# Patient Record
Sex: Male | Born: 1950 | Race: White | Hispanic: No | Marital: Single | Smoking: Never smoker
Health system: Southern US, Community
[De-identification: ages and names within clinical notes are randomized; demographics above are authoritative.]

## PROBLEM LIST (undated history)

## (undated) DIAGNOSIS — F419 Anxiety disorder, unspecified: Secondary | ICD-10-CM

## (undated) DIAGNOSIS — R51 Headache: Secondary | ICD-10-CM

## (undated) DIAGNOSIS — G4701 Insomnia due to medical condition: Secondary | ICD-10-CM

## (undated) DIAGNOSIS — I10 Essential (primary) hypertension: Secondary | ICD-10-CM

## (undated) DIAGNOSIS — G8929 Other chronic pain: Secondary | ICD-10-CM

## (undated) DIAGNOSIS — R519 Headache, unspecified: Secondary | ICD-10-CM

## (undated) HISTORY — DX: Insomnia due to medical condition: G47.01

## (undated) HISTORY — DX: Other chronic pain: G89.29

## (undated) HISTORY — DX: Headache: R51

## (undated) HISTORY — PX: OTHER SURGICAL HISTORY: SHX169

## (undated) HISTORY — DX: Headache, unspecified: R51.9

---

## 1998-05-01 ENCOUNTER — Emergency Department (HOSPITAL_COMMUNITY): Admission: EM | Admit: 1998-05-01 | Discharge: 1998-05-02 | Payer: Self-pay | Admitting: Emergency Medicine

## 1998-05-01 ENCOUNTER — Encounter: Payer: Self-pay | Admitting: Emergency Medicine

## 2002-09-24 ENCOUNTER — Encounter: Payer: Self-pay | Admitting: Emergency Medicine

## 2002-09-24 ENCOUNTER — Emergency Department (HOSPITAL_COMMUNITY): Admission: EM | Admit: 2002-09-24 | Discharge: 2002-09-24 | Payer: Self-pay | Admitting: Emergency Medicine

## 2003-05-07 ENCOUNTER — Emergency Department (HOSPITAL_COMMUNITY): Admission: EM | Admit: 2003-05-07 | Discharge: 2003-05-07 | Payer: Self-pay | Admitting: Emergency Medicine

## 2004-03-19 ENCOUNTER — Ambulatory Visit: Payer: Self-pay | Admitting: Family Medicine

## 2004-04-12 ENCOUNTER — Ambulatory Visit: Payer: Self-pay | Admitting: Family Medicine

## 2004-04-16 ENCOUNTER — Ambulatory Visit: Payer: Self-pay | Admitting: Family Medicine

## 2004-05-21 ENCOUNTER — Ambulatory Visit: Payer: Self-pay | Admitting: Family Medicine

## 2004-06-18 ENCOUNTER — Ambulatory Visit: Payer: Self-pay | Admitting: Family Medicine

## 2004-07-16 ENCOUNTER — Ambulatory Visit: Payer: Self-pay | Admitting: Family Medicine

## 2004-07-18 ENCOUNTER — Ambulatory Visit: Payer: Self-pay | Admitting: Family Medicine

## 2004-07-28 ENCOUNTER — Emergency Department (HOSPITAL_COMMUNITY): Admission: EM | Admit: 2004-07-28 | Discharge: 2004-07-28 | Payer: Self-pay | Admitting: Emergency Medicine

## 2004-07-31 ENCOUNTER — Ambulatory Visit: Payer: Self-pay | Admitting: Family Medicine

## 2004-08-29 ENCOUNTER — Ambulatory Visit: Payer: Self-pay | Admitting: Family Medicine

## 2004-09-12 ENCOUNTER — Ambulatory Visit: Payer: Self-pay | Admitting: Family Medicine

## 2004-10-01 ENCOUNTER — Ambulatory Visit: Payer: Self-pay | Admitting: Family Medicine

## 2004-10-16 ENCOUNTER — Ambulatory Visit: Payer: Self-pay | Admitting: Family Medicine

## 2004-11-20 ENCOUNTER — Ambulatory Visit: Payer: Self-pay | Admitting: Family Medicine

## 2004-12-25 ENCOUNTER — Ambulatory Visit: Payer: Self-pay | Admitting: Family Medicine

## 2005-01-22 ENCOUNTER — Ambulatory Visit: Payer: Self-pay | Admitting: Family Medicine

## 2005-01-30 ENCOUNTER — Ambulatory Visit: Payer: Self-pay | Admitting: Family Medicine

## 2005-02-13 ENCOUNTER — Ambulatory Visit: Payer: Self-pay | Admitting: Family Medicine

## 2005-02-26 ENCOUNTER — Ambulatory Visit: Payer: Self-pay | Admitting: Family Medicine

## 2005-03-04 ENCOUNTER — Emergency Department (HOSPITAL_COMMUNITY): Admission: EM | Admit: 2005-03-04 | Discharge: 2005-03-04 | Payer: Self-pay | Admitting: Emergency Medicine

## 2005-04-03 ENCOUNTER — Ambulatory Visit: Payer: Self-pay | Admitting: Family Medicine

## 2005-04-16 ENCOUNTER — Ambulatory Visit: Payer: Self-pay | Admitting: Family Medicine

## 2005-05-01 ENCOUNTER — Ambulatory Visit: Payer: Self-pay | Admitting: Family Medicine

## 2005-05-15 ENCOUNTER — Ambulatory Visit: Payer: Self-pay | Admitting: Family Medicine

## 2005-06-12 ENCOUNTER — Ambulatory Visit: Payer: Self-pay | Admitting: Family Medicine

## 2005-07-03 ENCOUNTER — Ambulatory Visit: Payer: Self-pay | Admitting: Family Medicine

## 2005-09-02 ENCOUNTER — Ambulatory Visit: Payer: Self-pay | Admitting: Family Medicine

## 2005-09-17 ENCOUNTER — Ambulatory Visit: Payer: Self-pay | Admitting: Family Medicine

## 2005-09-30 ENCOUNTER — Ambulatory Visit: Payer: Self-pay | Admitting: Family Medicine

## 2005-10-08 ENCOUNTER — Ambulatory Visit: Payer: Self-pay | Admitting: Family Medicine

## 2005-10-23 ENCOUNTER — Ambulatory Visit: Payer: Self-pay | Admitting: Family Medicine

## 2005-11-25 ENCOUNTER — Ambulatory Visit: Payer: Self-pay | Admitting: Family Medicine

## 2005-12-10 ENCOUNTER — Ambulatory Visit: Payer: Self-pay | Admitting: Family Medicine

## 2005-12-23 ENCOUNTER — Ambulatory Visit: Payer: Self-pay | Admitting: Family Medicine

## 2006-01-14 ENCOUNTER — Ambulatory Visit: Payer: Self-pay | Admitting: Family Medicine

## 2006-01-27 ENCOUNTER — Ambulatory Visit: Payer: Self-pay | Admitting: Family Medicine

## 2006-02-24 ENCOUNTER — Ambulatory Visit: Payer: Self-pay | Admitting: Family Medicine

## 2006-02-25 ENCOUNTER — Ambulatory Visit: Payer: Self-pay | Admitting: Family Medicine

## 2006-03-31 ENCOUNTER — Ambulatory Visit: Payer: Self-pay | Admitting: Family Medicine

## 2006-04-28 ENCOUNTER — Ambulatory Visit: Payer: Self-pay | Admitting: Family Medicine

## 2006-05-21 ENCOUNTER — Ambulatory Visit: Payer: Self-pay | Admitting: Family Medicine

## 2006-05-28 ENCOUNTER — Ambulatory Visit: Payer: Self-pay | Admitting: Family Medicine

## 2006-06-09 ENCOUNTER — Ambulatory Visit: Payer: Self-pay | Admitting: Family Medicine

## 2006-07-14 ENCOUNTER — Ambulatory Visit: Payer: Self-pay | Admitting: Family Medicine

## 2006-07-15 ENCOUNTER — Encounter: Payer: Self-pay | Admitting: Family Medicine

## 2006-10-14 ENCOUNTER — Encounter: Payer: Self-pay | Admitting: Family Medicine

## 2006-10-14 DIAGNOSIS — I10 Essential (primary) hypertension: Secondary | ICD-10-CM | POA: Insufficient documentation

## 2006-10-14 DIAGNOSIS — R519 Headache, unspecified: Secondary | ICD-10-CM | POA: Insufficient documentation

## 2006-10-14 DIAGNOSIS — R51 Headache: Secondary | ICD-10-CM | POA: Insufficient documentation

## 2007-04-30 ENCOUNTER — Telehealth: Payer: Self-pay | Admitting: Family Medicine

## 2007-05-07 ENCOUNTER — Telehealth (INDEPENDENT_AMBULATORY_CARE_PROVIDER_SITE_OTHER): Payer: Self-pay | Admitting: *Deleted

## 2007-12-22 ENCOUNTER — Telehealth: Payer: Self-pay | Admitting: Family Medicine

## 2007-12-31 ENCOUNTER — Emergency Department (HOSPITAL_COMMUNITY): Admission: EM | Admit: 2007-12-31 | Discharge: 2007-12-31 | Payer: Self-pay | Admitting: *Deleted

## 2008-01-03 ENCOUNTER — Ambulatory Visit: Payer: Self-pay | Admitting: *Deleted

## 2008-04-27 ENCOUNTER — Emergency Department (HOSPITAL_COMMUNITY): Admission: EM | Admit: 2008-04-27 | Discharge: 2008-04-27 | Payer: Self-pay | Admitting: Emergency Medicine

## 2008-07-06 ENCOUNTER — Emergency Department (HOSPITAL_COMMUNITY): Admission: EM | Admit: 2008-07-06 | Discharge: 2008-07-06 | Payer: Self-pay | Admitting: Emergency Medicine

## 2008-07-19 ENCOUNTER — Emergency Department (HOSPITAL_COMMUNITY): Admission: EM | Admit: 2008-07-19 | Discharge: 2008-07-19 | Payer: Self-pay | Admitting: Emergency Medicine

## 2008-08-24 ENCOUNTER — Emergency Department (HOSPITAL_COMMUNITY): Admission: EM | Admit: 2008-08-24 | Discharge: 2008-08-24 | Payer: Self-pay | Admitting: Emergency Medicine

## 2008-09-10 ENCOUNTER — Emergency Department (HOSPITAL_COMMUNITY): Admission: EM | Admit: 2008-09-10 | Discharge: 2008-09-10 | Payer: Self-pay | Admitting: Emergency Medicine

## 2008-10-08 ENCOUNTER — Emergency Department (HOSPITAL_COMMUNITY): Admission: EM | Admit: 2008-10-08 | Discharge: 2008-10-08 | Payer: Self-pay | Admitting: Emergency Medicine

## 2008-12-02 ENCOUNTER — Emergency Department (HOSPITAL_COMMUNITY): Admission: EM | Admit: 2008-12-02 | Discharge: 2008-12-02 | Payer: Self-pay | Admitting: Emergency Medicine

## 2009-05-02 ENCOUNTER — Encounter (INDEPENDENT_AMBULATORY_CARE_PROVIDER_SITE_OTHER): Payer: Self-pay | Admitting: *Deleted

## 2010-01-02 ENCOUNTER — Telehealth: Payer: Self-pay | Admitting: Gastroenterology

## 2010-02-07 ENCOUNTER — Emergency Department (HOSPITAL_COMMUNITY): Admission: EM | Admit: 2010-02-07 | Discharge: 2010-02-07 | Payer: Self-pay | Admitting: Emergency Medicine

## 2010-06-20 NOTE — Progress Notes (Signed)
Summary: Schedule Colonoscopy  Phone Note Outgoing Call Call back at Home Phone [phone]   Call placed by: Harlow Mares CMA Duncan Dull),  January 02, 2010 11:18 AM Call placed to: Patient Summary of Call: Left message on patients machine to call back, patient is due for a colonoscopy Initial call taken by: Harlow Mares CMA Duncan Dull),  January 02, 2010 11:18 AM  Follow-up for Phone Call        Left a message on the patient machine to call back and schedule a previsit and procedure with our office. A letter will be mailed to the patient.  Follow-up by: Harlow Mares CMA Duncan Dull),  January 07, 2010 10:19 AM

## 2010-10-04 NOTE — Assessment & Plan Note (Signed)
[facility]                                 ON-CALL NOTE   NAME:Lezama, XUE LOW                      MRN:          [id number]  DATE:05/09/2006                            DOB:          1950/06/08    At 4:20 p.m. on May 09, 2006.   Mr. Trupiano calls and says he had tried on Friday to transfer  prescription for Viagra from [facility] to [facility], and that they did  not know who he was, and they could not do it.  This was an unusual  phone call.  I told him that usuallyyou would want your doctor to change  that, and he said pharmacies do it, but they did not know who he was, so  he was asking me for help.  I then told him that we really do not do  things on the weekends.  I do not know his medical history; Viagra can  have complications or side effects, and he hung up on me.  I am not sure  if he was on the level, but it was not a standard phone call, and I had  a sense that it was not on the level.  It might have been an established  patient with a reasonable request, in which case Dr. Scotty Court may want  to talk to the patient and get his Viagra refilled.     Karie Schwalbe, MD  Electronically Signed    RIL/MedQ  DD: 05/09/2006  DT: 05/10/2006  Job #: [id number]   cc:   Ellin Saba., MD

## 2011-02-19 ENCOUNTER — Telehealth: Payer: Self-pay | Admitting: Family Medicine

## 2011-02-19 NOTE — Telephone Encounter (Signed)
Pt is req to re-est with Dr Scotty Court. Pt said he goes by the name of "Charles Ball". Pt says that he does have an outstanding balance with [employer], but pt is planning on making pymts. Pt would be self pay in cash. Pls advise.

## 2011-08-11 ENCOUNTER — Encounter: Payer: Self-pay | Admitting: Gastroenterology

## 2011-10-18 ENCOUNTER — Emergency Department (HOSPITAL_COMMUNITY): Payer: Medicaid Other

## 2011-10-18 ENCOUNTER — Encounter (HOSPITAL_COMMUNITY): Payer: Self-pay | Admitting: *Deleted

## 2011-10-18 ENCOUNTER — Inpatient Hospital Stay (HOSPITAL_COMMUNITY)
Admission: EM | Admit: 2011-10-18 | Discharge: 2011-10-31 | DRG: 853 | Disposition: A | Discharge status: Skilled Nursing Facility | Payer: Medicaid Other | Source: Ambulatory Visit | Attending: Internal Medicine | Admitting: Internal Medicine

## 2011-10-18 DIAGNOSIS — M6282 Rhabdomyolysis: Secondary | ICD-10-CM | POA: Diagnosis present

## 2011-10-18 DIAGNOSIS — A419 Sepsis, unspecified organism: Secondary | ICD-10-CM

## 2011-10-18 DIAGNOSIS — D6959 Other secondary thrombocytopenia: Secondary | ICD-10-CM | POA: Diagnosis present

## 2011-10-18 DIAGNOSIS — R51 Headache: Secondary | ICD-10-CM

## 2011-10-18 DIAGNOSIS — G629 Polyneuropathy, unspecified: Secondary | ICD-10-CM

## 2011-10-18 DIAGNOSIS — G8190 Hemiplegia, unspecified affecting unspecified side: Secondary | ICD-10-CM | POA: Diagnosis present

## 2011-10-18 DIAGNOSIS — G062 Extradural and subdural abscess, unspecified: Secondary | ICD-10-CM

## 2011-10-18 DIAGNOSIS — I4949 Other premature depolarization: Secondary | ICD-10-CM | POA: Diagnosis not present

## 2011-10-18 DIAGNOSIS — M9901 Segmental and somatic dysfunction of cervical region: Secondary | ICD-10-CM

## 2011-10-18 DIAGNOSIS — N179 Acute kidney failure, unspecified: Secondary | ICD-10-CM | POA: Diagnosis not present

## 2011-10-18 DIAGNOSIS — E876 Hypokalemia: Secondary | ICD-10-CM | POA: Diagnosis present

## 2011-10-18 DIAGNOSIS — G039 Meningitis, unspecified: Secondary | ICD-10-CM

## 2011-10-18 DIAGNOSIS — J0100 Acute maxillary sinusitis, unspecified: Secondary | ICD-10-CM | POA: Diagnosis present

## 2011-10-18 DIAGNOSIS — R6520 Severe sepsis without septic shock: Secondary | ICD-10-CM | POA: Diagnosis present

## 2011-10-18 DIAGNOSIS — M9981 Other biomechanical lesions of cervical region: Secondary | ICD-10-CM | POA: Diagnosis present

## 2011-10-18 DIAGNOSIS — R7881 Bacteremia: Secondary | ICD-10-CM | POA: Diagnosis present

## 2011-10-18 DIAGNOSIS — N139 Obstructive and reflux uropathy, unspecified: Secondary | ICD-10-CM | POA: Diagnosis not present

## 2011-10-18 DIAGNOSIS — I10 Essential (primary) hypertension: Secondary | ICD-10-CM

## 2011-10-18 DIAGNOSIS — A4101 Sepsis due to Methicillin susceptible Staphylococcus aureus: Principal | ICD-10-CM | POA: Diagnosis present

## 2011-10-18 DIAGNOSIS — D696 Thrombocytopenia, unspecified: Secondary | ICD-10-CM

## 2011-10-18 DIAGNOSIS — R7309 Other abnormal glucose: Secondary | ICD-10-CM | POA: Diagnosis present

## 2011-10-18 DIAGNOSIS — K5900 Constipation, unspecified: Secondary | ICD-10-CM | POA: Diagnosis not present

## 2011-10-18 DIAGNOSIS — M5000 Cervical disc disorder with myelopathy, unspecified cervical region: Secondary | ICD-10-CM | POA: Diagnosis present

## 2011-10-18 DIAGNOSIS — G609 Hereditary and idiopathic neuropathy, unspecified: Secondary | ICD-10-CM | POA: Diagnosis present

## 2011-10-18 DIAGNOSIS — B958 Unspecified staphylococcus as the cause of diseases classified elsewhere: Secondary | ICD-10-CM

## 2011-10-18 DIAGNOSIS — R739 Hyperglycemia, unspecified: Secondary | ICD-10-CM

## 2011-10-18 DIAGNOSIS — F411 Generalized anxiety disorder: Secondary | ICD-10-CM | POA: Diagnosis present

## 2011-10-18 DIAGNOSIS — I491 Atrial premature depolarization: Secondary | ICD-10-CM | POA: Diagnosis not present

## 2011-10-18 DIAGNOSIS — J9600 Acute respiratory failure, unspecified whether with hypoxia or hypercapnia: Secondary | ICD-10-CM

## 2011-10-18 DIAGNOSIS — G9341 Metabolic encephalopathy: Secondary | ICD-10-CM | POA: Diagnosis present

## 2011-10-18 DIAGNOSIS — E871 Hypo-osmolality and hyponatremia: Secondary | ICD-10-CM | POA: Diagnosis present

## 2011-10-18 DIAGNOSIS — E8779 Other fluid overload: Secondary | ICD-10-CM | POA: Diagnosis not present

## 2011-10-18 HISTORY — DX: Essential (primary) hypertension: I10

## 2011-10-18 HISTORY — DX: Anxiety disorder, unspecified: F41.9

## 2011-10-18 HISTORY — DX: Other chronic pain: G89.29

## 2011-10-18 LAB — BLOOD GAS, ARTERIAL
Acid-base deficit: 0.7 mmol/L (ref 0.0–2.0)
Drawn by: 235321
FIO2: 1 %
MECHVT: 0.45 mL
O2 Saturation: 99.6 %
PEEP: 5 cmH2O
RATE: 26 resp/min
pCO2 arterial: 33.3 mmHg — ABNORMAL LOW (ref 35.0–45.0)

## 2011-10-18 LAB — URINALYSIS, ROUTINE W REFLEX MICROSCOPIC
Glucose, UA: NEGATIVE mg/dL
Protein, ur: 100 mg/dL — AB

## 2011-10-18 LAB — CBC
HCT: 43.8 % (ref 39.0–52.0)
Hemoglobin: 16 g/dL (ref 13.0–17.0)
MCV: 83.7 fL (ref 78.0–100.0)
RBC: 5.23 MIL/uL (ref 4.22–5.81)
WBC: 12.9 10*3/uL — ABNORMAL HIGH (ref 4.0–10.5)

## 2011-10-18 LAB — COMPREHENSIVE METABOLIC PANEL
Albumin: 3.3 g/dL — ABNORMAL LOW (ref 3.5–5.2)
Alkaline Phosphatase: 75 U/L (ref 39–117)
BUN: 28 mg/dL — ABNORMAL HIGH (ref 6–23)
CO2: 21 mEq/L (ref 19–32)
Chloride: 88 mEq/L — ABNORMAL LOW (ref 96–112)
GFR calc non Af Amer: >90 mL/min (ref >90)
Glucose, Bld: 163 mg/dL — ABNORMAL HIGH (ref 70–99)
Potassium: 4.1 mEq/L (ref 3.5–5.1)
Total Bilirubin: 1.1 mg/dL (ref 0.3–1.2)

## 2011-10-18 LAB — DIFFERENTIAL
Basophils Absolute: 0 10*3/uL (ref 0.0–0.1)
Basophils Relative: 0 % (ref 0–1)
Eosinophils Relative: 0 % (ref 0–5)
Lymphocytes Relative: 5 % — ABNORMAL LOW (ref 12–46)
Monocytes Relative: 13 % — ABNORMAL HIGH (ref 3–12)
Neutro Abs: 10.6 10*3/uL — ABNORMAL HIGH (ref 1.7–7.7)

## 2011-10-18 LAB — RAPID URINE DRUG SCREEN, HOSP PERFORMED
Benzodiazepines: POSITIVE — AB
Cocaine: NOT DETECTED

## 2011-10-18 LAB — GLUCOSE, CAPILLARY: Glucose-Capillary: 161 mg/dL — ABNORMAL HIGH (ref 70–99)

## 2011-10-18 LAB — ETHANOL: Alcohol, Ethyl (B): <11 mg/dL (ref 0–11)

## 2011-10-18 LAB — PROCALCITONIN: Procalcitonin: 2.09 ng/mL

## 2011-10-18 LAB — LACTIC ACID, PLASMA: Lactic Acid, Venous: 2.9 mmol/L — ABNORMAL HIGH (ref 0.5–2.2)

## 2011-10-18 LAB — CARDIAC PANEL(CRET KIN+CKTOT+MB+TROPI): Relative Index: 0.5 (ref 0.0–2.5)

## 2011-10-18 MED ORDER — VANCOMYCIN HCL IN DEXTROSE 1-5 GM/200ML-% IV SOLN
1000.0000 mg | Freq: Once | INTRAVENOUS | Status: AC
  Administered 2011-10-19: 1000 mg via INTRAVENOUS
  Filled 2011-10-18: qty 200

## 2011-10-18 MED ORDER — VANCOMYCIN HCL 1000 MG IV SOLR
1250.0000 mg | Freq: Two times a day (BID) | INTRAVENOUS | Status: DC
  Administered 2011-10-19 – 2011-10-20 (×4): 1250 mg via INTRAVENOUS
  Filled 2011-10-18 (×6): qty 1250

## 2011-10-18 MED ORDER — PIPERACILLIN-TAZOBACTAM 3.375 G IVPB
3.3750 g | Freq: Once | INTRAVENOUS | Status: DC
  Administered 2011-10-18: 3.375 g via INTRAVENOUS
  Filled 2011-10-18: qty 50

## 2011-10-18 MED ORDER — DEXTROSE 5 % IV SOLN
2.0000 g | Freq: Two times a day (BID) | INTRAVENOUS | Status: DC
  Administered 2011-10-19 – 2011-10-20 (×3): 2 g via INTRAVENOUS
  Filled 2011-10-18 (×5): qty 2

## 2011-10-18 MED ORDER — PROPOFOL 10 MG/ML IV EMUL
INTRAVENOUS | Status: AC
  Filled 2011-10-18: qty 100

## 2011-10-18 MED ORDER — VANCOMYCIN HCL IN DEXTROSE 1-5 GM/200ML-% IV SOLN
1000.0000 mg | Freq: Once | INTRAVENOUS | Status: AC
  Administered 2011-10-18: 1000 mg via INTRAVENOUS
  Filled 2011-10-18: qty 200

## 2011-10-18 MED ORDER — FENTANYL CITRATE 0.05 MG/ML IJ SOLN
INTRAMUSCULAR | Status: AC
  Administered 2011-10-18: 100 ug
  Filled 2011-10-18: qty 2

## 2011-10-18 MED ORDER — ROCURONIUM BROMIDE 50 MG/5ML IV SOLN
INTRAVENOUS | Status: AC
  Administered 2011-10-18: 100 mg
  Filled 2011-10-18: qty 2

## 2011-10-18 MED ORDER — NALOXONE HCL 0.4 MG/ML IJ SOLN
0.4000 mg | Freq: Once | INTRAMUSCULAR | Status: AC
  Administered 2011-10-18: 0.4 mg via INTRAVENOUS
  Filled 2011-10-18: qty 1

## 2011-10-18 MED ORDER — SODIUM CHLORIDE 0.9 % IV BOLUS (SEPSIS)
500.0000 mL | Freq: Once | INTRAVENOUS | Status: AC
  Administered 2011-10-18: 500 mL via INTRAVENOUS

## 2011-10-18 MED ORDER — PROPOFOL 10 MG/ML IV EMUL
5.0000 ug/kg/min | INTRAVENOUS | Status: DC
  Administered 2011-10-18: 5 ug/kg/min via INTRAVENOUS
  Administered 2011-10-19 (×2): 30 ug/kg/min via INTRAVENOUS
  Administered 2011-10-19: 50 ug/kg/min via INTRAVENOUS
  Administered 2011-10-19: 40 ug/kg/min via INTRAVENOUS
  Administered 2011-10-20: 35 ug/kg/min via INTRAVENOUS
  Administered 2011-10-20: 30 ug/kg/min via INTRAVENOUS
  Filled 2011-10-18 (×7): qty 100

## 2011-10-18 MED ORDER — SODIUM CHLORIDE 0.9 % IV BOLUS (SEPSIS)
1000.0000 mL | Freq: Once | INTRAVENOUS | Status: AC
  Administered 2011-10-18: 1000 mL via INTRAVENOUS

## 2011-10-18 MED ORDER — SUCCINYLCHOLINE CHLORIDE 20 MG/ML IJ SOLN
INTRAMUSCULAR | Status: AC
  Filled 2011-10-18: qty 5

## 2011-10-18 MED ORDER — SODIUM CHLORIDE 0.9 % IV SOLN
2.0000 g | INTRAVENOUS | Status: DC
  Administered 2011-10-19 – 2011-10-20 (×9): 2 g via INTRAVENOUS
  Filled 2011-10-18 (×15): qty 2000

## 2011-10-18 MED ORDER — SODIUM CHLORIDE 0.9 % IV SOLN
INTRAVENOUS | Status: DC
  Administered 2011-10-18 – 2011-10-21 (×4): via INTRAVENOUS

## 2011-10-18 MED ORDER — ETOMIDATE 2 MG/ML IV SOLN
INTRAVENOUS | Status: AC
  Administered 2011-10-18: 30 mg
  Filled 2011-10-18: qty 20

## 2011-10-18 MED ORDER — LIDOCAINE HCL (CARDIAC) 20 MG/ML IV SOLN
INTRAVENOUS | Status: AC
  Filled 2011-10-18: qty 5

## 2011-10-18 MED ORDER — DEXAMETHASONE SODIUM PHOSPHATE 4 MG/ML IJ SOLN
INTRAMUSCULAR | Status: AC
  Administered 2011-10-18: 12 mg via INTRAVENOUS
  Filled 2011-10-18: qty 3

## 2011-10-18 MED ORDER — DEXAMETHASONE SODIUM PHOSPHATE 4 MG/ML IJ SOLN
12.0000 mg | INTRAMUSCULAR | Status: DC
  Administered 2011-10-18 – 2011-10-19 (×3): 12 mg via INTRAVENOUS
  Filled 2011-10-18 (×8): qty 3

## 2011-10-18 NOTE — ED Notes (Signed)
Pt

## 2011-10-18 NOTE — ED Notes (Signed)
Per- pt comes from home where he lives with his wife.  Pt was found on floor this a.m. @ 2 and has remained there until shortly before arrival.  Pt was laying face down.  Pt stated he was unable to get up.  Pt has abrasions to knees and abdomen.  Pt denies LOC, but states he was too weak to get up off the floor.  No pain, dizziness, SOB or nausea reported.  Pt's CBG 221 on scene.  Vitals 164/102, 102 HR, 14 respers.

## 2011-10-18 NOTE — ED Notes (Signed)
UJW:[id number]<YN> Expected date:10/18/11<BR> Expected time:<BR> Means of arrival:<BR> Comments:<BR> EMS [address] - lethargy

## 2011-10-18 NOTE — Progress Notes (Signed)
ANTIBIOTIC CONSULT NOTE - INITIAL  Pharmacy Consult for Vancomycin, Rocephin, Ampicillin Indication: Rule out Meningitis  No Known Allergies  Patient Measurements: Estimated per RN: Height: 74 inches Weight: 109kg IBW: 82kg   Vital Signs: Temp: 101.2 F (38.4 C) (06/01 1935) Temp src: Oral (06/01 1935) BP: 159/84 mmHg (06/01 2130) Pulse Rate: 96  (06/01 2130) Intake/Output from previous day:   Intake/Output from this shift:    Labs:  [facility] 10/18/11 1910  WBC 12.9*  HGB 16.0  PLT 113*  LABCREA --  CREATININE 0.73   CrCl = 110 ml/min (normalized)  Microbiology: 6/1 blood x2: pending 6/1 urine: pending  Medical History: Past Medical History  Diagnosis Date  . Hypertension     Medications:  Scheduled:    . ampicillin (OMNIPEN) IV  2 g Intravenous Q4H  . cefTRIAXone (ROCEPHIN)  IV  2 g Intravenous Q12H  . dexamethasone  12 mg Intravenous Q4H  . etomidate      . lidocaine (cardiac) 100 mg/56ml      . naloxone (NARCAN) injection  0.4 mg Intravenous Once  . propofol      . rocuronium      . sodium chloride  1,000 mL Intravenous Once  . sodium chloride  500 mL Intravenous Once  . succinylcholine      . vancomycin  1,000 mg Intravenous Once  . DISCONTD: piperacillin-tazobactam (ZOSYN)  IV  3.375 g Intravenous Once   Infusions:    . sodium chloride 125 mL/hr at 10/18/11 2026  . propofol     Assessment:  60 YOM found on the floor this am, unable to be aroused  He is now intubated in the ER  Beginning broad spectrum antibiotics for rule out meningitis  Renal function is normal, WBC elevated, febrile 101.2  Blood and urine cultures pending  Goal of Therapy:  Vancomycin trough level 15-20 mcg/ml  Plan:   Vancomcyin 2gm total IV load, then 1250mg  IV q12h  Check trough at steady state  Continue Ampicillin 2gm IV q4h as ordered by MD  Continue Rocephin 2gm IV q12h as ordered by MD  Follow renal function & cultures   Loralee Pacas,  PharmD, BCPS Pager: [phone] 10/18/2011,9:47 PM

## 2011-10-18 NOTE — ED Notes (Signed)
Pt girlfriend states that the pt had left arm pain for 2 days and yesterday he started having right arm pain. He didn't state he had any chest pain, but he was holding his left arm at a 90 degree angle close to his chest. She woke up at 0230 and found him on the floor unable to move. She kept trying to get him up all day and decided at 1430 to get help. Pt has recently been diagnosed with HTN and started antihypertensives recently.

## 2011-10-18 NOTE — ED Provider Notes (Signed)
History     CSN: [id number]  Arrival date & time 10/18/11  1811   First MD Initiated Contact with Patient 10/18/11 1841    He is in  Chief Complaint  Patient presents with  . Altered Mental Status   Level V caveat due to altered mental status. (Consider location/radiation/quality/duration/timing/severity/associated sxs/prior treatment) Patient is a 61 y.o. male presenting with altered mental status. The history is provided by the patient.  Altered Mental Status This is a new problem.  Patient had been on the floor after fall. He states that he was too weak to get up. He does not know exactly happened. He states that he has had a. No chest or abdominal pain. States the light bothers him. Patient's girlfriend later stated that he been complaining of left arm and right arm pain. No dysuria.   Past Medical History  Diagnosis Date  . Hypertension     History reviewed. No pertinent past surgical history.  No family history on file.  History  Substance Use Topics  . Smoking status: Not on file  . Smokeless tobacco: Not on file  . Alcohol Use:       Review of Systems  Unable to perform ROS Psychiatric/Behavioral: Positive for altered mental status.    Allergies  Review of patient's allergies indicates no known allergies.  Home Medications   No current outpatient prescriptions on file.  BP 156/83  Pulse 102  Temp(Src) 101.2 F (38.4 C) (Oral)  Resp 31  Ht 6\' 2"  (1.88 m)  Wt 240 lb 4.8 oz (109 kg)  BMI 30.85 kg/m2  SpO2 100%  Physical Exam  Constitutional: He appears well-developed.  HENT:  Head: Normocephalic.  Eyes: Pupils are equal, round, and reactive to light.  Neck:       Mild pain with movement of neck.  Cardiovascular: Normal rate.   Pulmonary/Chest: Effort normal.       Tachypnea  Abdominal: Soft. There is no tenderness.  Musculoskeletal: He exhibits no edema.  Neurological: He is alert.       Mild confusion initially. His mental status decreased  during the stay.  Skin:       Patient's had some red areas on his bilateral knees. He has a red area on his upper abdomen with some sloughing of the skin.    ED Course  Procedures (including critical care time)  Labs Reviewed  GLUCOSE, CAPILLARY - Abnormal; Notable for the following:    Glucose-Capillary 161 (*)    All other components within normal limits  CBC - Abnormal; Notable for the following:    WBC 12.9 (*)    MCHC 36.5 (*)    Platelets 113 (*)    All other components within normal limits  DIFFERENTIAL - Abnormal; Notable for the following:    Neutrophils Relative 82 (*)    Lymphocytes Relative 5 (*)    Monocytes Relative 13 (*)    Neutro Abs 10.6 (*)    Lymphs Abs 0.6 (*)    Monocytes Absolute 1.7 (*)    All other components within normal limits  COMPREHENSIVE METABOLIC PANEL - Abnormal; Notable for the following:    Sodium 123 (*)    Chloride 88 (*)    Glucose, Bld 163 (*)    BUN 28 (*)    Albumin 3.3 (*)    AST 53 (*) SLIGHT HEMOLYSIS   All other components within normal limits  URINALYSIS, ROUTINE W REFLEX MICROSCOPIC - Abnormal; Notable for the following:  Color, Urine AMBER (*) BIOCHEMICALS MAY BE AFFECTED BY COLOR   APPearance CLOUDY (*)    Specific Gravity, Urine 1.032 (*)    Hgb urine dipstick LARGE (*)    Bilirubin Urine SMALL (*)    Ketones, ur TRACE (*)    Protein, ur 100 (*)    Leukocytes, UA TRACE (*)    All other components within normal limits  CARDIAC PANEL(CRET KIN+CKTOT+MB+TROPI) - Abnormal; Notable for the following:    Total CK 1402 (*)    CK, MB 7.2 (*)    All other components within normal limits  URINE RAPID DRUG SCREEN (HOSP PERFORMED) - Abnormal; Notable for the following:    Benzodiazepines POSITIVE (*)    All other components within normal limits  LACTIC ACID, PLASMA - Abnormal; Notable for the following:    Lactic Acid, Venous 2.9 (*)    All other components within normal limits  BLOOD GAS, ARTERIAL - Abnormal; Notable for  the following:    pCO2 arterial 33.3 (*)    pO2, Arterial 386.0 (*)    All other components within normal limits  ETHANOL  PROCALCITONIN  URINE MICROSCOPIC-ADD ON  CULTURE, BLOOD (ROUTINE X 2)  CULTURE, BLOOD (ROUTINE X 2)  URINE CULTURE  BLOOD GAS, ARTERIAL   Ct Head Wo Contrast  10/18/2011  *RADIOLOGY REPORT*  Clinical Data: 61 year old male found down.  Weakness.  CT HEAD WITHOUT CONTRAST  Technique:  Contiguous axial images were obtained from the base of the skull through the vertex without contrast.  Comparison: None.  Findings: Mixed layering and bubbly opacity in the right maxillary sinus.  Other Visualized paranasal sinuses and mastoids are clear. No acute osseous abnormality identified.  Visualized orbits and scalp soft tissues are within normal limits.  Calcified atherosclerosis at the skull base.  No suspicious intracranial vascular hyperdensity.  No ventriculomegaly. No midline shift, mass effect, or evidence of mass lesion.  No acute intracranial hemorrhage identified.  No evidence of cortically based acute infarction identified.  IMPRESSION: 1.  Normal for age noncontrast CT appearance of the brain. 2.  Right maxillary sinusitis.  Original Report Authenticated By: Harley Hallmark, M.D.   Dg Chest Port 1 View  10/18/2011  *RADIOLOGY REPORT*  Clinical Data: Evaluate endotracheal tube.  Hypertension.  PORTABLE CHEST - 1 VIEW  Comparison: 10/18/2011 at the 1953 hours  Findings: Interval placement of endotracheal tube with tip about 4.8 cm above the carina.  An enteric tube has been placed.  The tip is not visualized but is below the left hemidiaphragm consistent with location at least in the stomach.  Shallow inspiration.  Mild cardiac enlargement with suggestion of mild pulmonary vascular congestion.  Linear atelectasis in the lung bases.  No pneumothorax.  No blunting of costophrenic angles.  IMPRESSION: Endotracheal tube placed with tip about 4.8 cm above the carina.  Original Report  Authenticated By: Marlon Pel, M.D.   Dg Chest Port 1 View  10/18/2011  *RADIOLOGY REPORT*  Clinical Data: 61 year old male with hypertension and altered mental status.  PORTABLE CHEST - 1 VIEW  Comparison: None.  Findings: Upright AP view 1953 hours.  Low lung volumes.  Right greater than left streaky bibasilar opacity.  No pneumothorax, pulmonary edema or definite effusion. There is cardiomegaly.  Other mediastinal contours are within normal limits.  Visualized tracheal air column is within normal limits.  IMPRESSION: Low lung volumes with right greater than left basilar opacity. Favor atelectasis but lung base infection cannot be excluded.  Original Report  Authenticated By: Harley Hallmark, M.D.     1. Severe sepsis    CRITICAL CARE Performed by: Billee Cashing   Total critical care time: 30  Critical care time was exclusive of separately billable procedures and treating other patients.  Critical care was necessary to treat or prevent imminent or life-threatening deterioration.  Critical care was time spent personally by me on the following activities: development of treatment plan with patient and/or surrogate as well as nursing, discussions with consultants, evaluation of patient's response to treatment, examination of patient, obtaining history from patient or surrogate, ordering and performing treatments and interventions, ordering and review of laboratory studies, ordering and review of radiographic studies, pulse oximetry and re-evaluation of patient's condition.   MDM  Patient presents with generalized weakness. He is febrile. He states the light bothers his eyes. Initially pupils were reactive but it later became more constricted. His right eye and later deviated to the right. Head CT did not show any acute bleed. Does have a sinusitis. Patient's mental status decreased and later required intubation. His blood pressure decreased, but improved with IV fluids. Meningitis was  considered on this patient and patient was empirically started on vancomycin and Zosyn.         Juliet Rude. Rubin Payor, MD 10/18/11 2315

## 2011-10-18 NOTE — Procedures (Signed)
Lumbar Puncture Procedure Note  Pre-operative Diagnosis: Meningitis  Post-operative Diagnosis: Meningitis  Indications: Diagnostic  Procedure Details   Consent: Informed consent was obtained. Risks of the procedure were discussed including: infection, bleeding, pain and headache.  The patient was positioned under sterile conditions. Betadine solution and sterile drapes were utilized. A spinal needle was inserted at the L4 - L5 interspace.  Spinal fluid was obtained and sent to the laboratory.  Findings 17mL of clear spinal fluid was obtained. Opening Pressure: 26cm H2O pressure.  Complications:  None; patient tolerated the procedure well.        Condition: stable  Plan Bed rest for 1 hours.

## 2011-10-18 NOTE — Procedures (Signed)
Intubation Procedure Note Charles Ball [id number] 13-Aug-1950  Procedure: Intubation Indications: Airway protection and maintenance  Procedure Details Consent: Risks of procedure as well as the alternatives and risks of each were explained to the (patient/caregiver).  Consent for procedure obtained. Time Out: Verified patient identification, verified procedure, site/side was marked, verified correct patient position, special equipment/implants available, medications/allergies/relevent history reviewed, required imaging and test results available.  Performed  Medications used: etomidate and rocuronium Equipment used: Macintosh 4 laryngoscope blade and 8.4mm cuffed endotracheal tube Grade II View Correct placement confirmed by by auscultation, by CXR and ETCO2 monitor Tube secured at 24 cm at the lip  Evaluation Hemodynamic Status: BP stable throughout; O2 sats: stable throughout Patient's Current Condition: stable Complications: No apparent complications Patient did tolerate procedure well. Chest X-ray ordered to verify placement.  CXR: pending.   Lavella Hammock, M.D. Pulmonary and Critical Care Medicine Call E-link with questions [phone] 10/18/2011

## 2011-10-18 NOTE — Code Documentation (Addendum)
Etomidate and roc pushed at 20:54.  Dr. Tula Nakayama from pulmonary medicine at head of bed to intubate.  RT Farris Has, RN and Lennart Pall, RN also at bedside.  BP 198/144, HR 109, RR 14, O2 sats 94% during intubation.  Color change noted, breath sounds = bilat.  Size 8 tube, 24 at the teeth at 20:58.

## 2011-10-18 NOTE — H&P (Addendum)
Name: Charles Ball MRN: [id number] DOB: 1950-07-09    LOS: 0  PULMONARY / CRITICAL CARE MEDICINE  HPI:  Mr. College is a 61 year-old gentleman who was found down at home. He had likely fallen out of bed, and indicated he was unable to get up.  He was conversant at that time, and denied loss of consciousness.  He subsequently became progressively altered and febrile.  CT head showed air and fluid in right maxillary sinus.  Past Medical History  Diagnosis Date  . Hypertension    History reviewed. No pertinent past surgical history. Prior to Admission medications   Medication Sig Start Date End Date Taking? Authorizing Provider  hydrochlorothiazide (HYDRODIURIL) 25 MG tablet Take 25 mg by mouth daily.   Yes Historical Provider, MD   Allergies No Known Allergies  Family History No family history on file. Social History  does not have a smoking history on file. He does not have any smokeless tobacco history on file. His alcohol and drug histories not on file.  Review Of Systems:  Unable to provide due to altered mental status.  Brief patient description:  61 yo M with presumed meningitis, mild rhabdo.  Events Since Admission: Intubated 6/1 LP 6/1  Current Status: Critical  Vital Signs: Temp:  [100.3 F (37.9 C)-102.5 F (39.2 C)] 101.2 F (38.4 C) (06/01 1935) Pulse Rate:  [92-109] 109  (06/01 2100) Resp:  [17-41] 26  (06/01 2104) BP: (86-175)/(42-110) 161/86 mmHg (06/01 2104) SpO2:  [93 %-100 %] 100 % (06/01 2104) FiO2 (%):  [100 %] 100 % (06/01 2100)  Physical Examination: General:  Intubated, mechanically ventilated, no acute distress Neuro:  Sedated, synchronous, nonfocal. Prior to intubation, GCS 8, incoherent HEENT:  PERRL, pink conjunctivae, moist membranes Neck:  Supple, no JVD   Cardiovascular:  RRR, no M/R/G Lungs:  Bilateral diminished air entry, no W/R/R Abdomen:  Soft, nontender, nondistended, bowel sounds present Musculoskeletal:  Moves all  extremities, no pedal edema Skin:  No rash, stage 1 pressure ulcer on abdomen.  Active Problems:  * No active hospital problems. *    ASSESSMENT AND PLAN  PULMONARY No results found for this basename: PHART:5,PCO2:5,PCO2ART:5,PO2ART:5,HCO3:5,O2SAT:5 in the last 168 hours Ventilator Settings: Vent Mode:  [-] PRVC FiO2 (%):  [100 %] 100 % Set Rate:  [26 bmp] 26 bmp Vt Set:  [450 mL] 450 mL PEEP:  [5 cmH20] 5 cmH20 CXR:  6/1 ETT in good position, no infiltrates ETT:  6/1 >>>  A:  Ventilator dependent respiratory failure P:   Rest on vent for airway protection.  Continuous sedation with propofol and fentanyl.  CARDIOVASCULAR  Lab 10/18/11 1910  TROPONINI <0.30  LATICACIDVEN --  PROBNP --   ECG:  Sinus tachcardia Lines: PIV  A: Hemodynamically stable currently P:  Monitor hemodynamics  RENAL  Lab 10/18/11 1910  NA 123*  K 4.1  CL 88*  CO2 21  BUN 28*  CREATININE 0.73  CALCIUM 8.6  MG --  PHOS --   Intake/Output    None    Foley:  6/1 >>>  A:  No issues currently P:   Follow renal function for antimicrobial dosing  GASTROINTESTINAL  Lab 10/18/11 1910  AST 53*  ALT 33  ALKPHOS 75  BILITOT 1.1  PROT 8.0  ALBUMIN 3.3*    A:  No issues P:   Will assess for tube feeding depending on duration of mechanical ventilation  HEMATOLOGIC  Lab 10/18/11 1910  HGB 16.0  HCT 43.8  PLT 113*  INR --  APTT --   A:  No issues P:  Follow  INFECTIOUS  Lab 10/18/11 1910  WBC 12.9*  PROCALCITON --   Cultures: Blood 6/1 >>> Urine 6/1 >>> CSF 6/1 >>> Antibiotics: Vanc 6/1 >>> Ceftiraxone 6/1 >>> Ampicillin 6/1 >>>  A:  Presumed meningitis P:   CT normal with the exception of acute sinusitis with air and fluid in sinus.  Will LP, differential includes bacterial (most likely given sinusitis), viral meningitis, viral meningoencephalitis (e.g. West Nile), tick-borne illness (e.g. RMSF).  Continue current antimicrobial coverage, consider acyclovir  and doxycycline if no improvement.  ENDOCRINE  Lab 10/18/11 1831  GLUCAP 161*   A:  Hyperglycemia   P:   Aggressive glucose control  NEUROLOGIC  A:  Altered mental status P:   See ID for infectious diff dx.  Drugs seem less likely given neg alcohol, but positive benzos (received none for intubation), but on differential.  BEST PRACTICE / DISPOSITION - Level of Care:  ICU - Primary Service:  PCCM - Consultants:  None - Code Status:  Full - Diet:  NPO, tube feeds when appropriate - DVT Px:  Heparin - GI Px:  Protonix - Skin Integrity:  Abdomen with stage 1 pressure ulcer, present on admission. - Social / Family:  Girlfriend updated at bedside  The patient is critically ill with multiple organ systems failure and requires high complexity decision making for assessment and support, frequent evaluation and titration of therapies, application of advanced monitoring technologies and extensive interpretation of multiple databases.   Critical Care Time devoted to patient care services described in this note is: 1 Hour  Lavella Hammock, M.D. Pulmonary and Critical Care Medicine [facility] Pager: [phone]  10/18/2011, 9:31 PM

## 2011-10-18 NOTE — ED Notes (Signed)
CBG in ED was 161, pt is arousable, eyes open, pupil reactive, alert, oriented to situation, disoriented to time, slow response to questions, sat 95% RA, HR varies from 98 to 100,

## 2011-10-18 NOTE — ED Notes (Signed)
MD at bedside. 

## 2011-10-19 ENCOUNTER — Inpatient Hospital Stay (HOSPITAL_COMMUNITY): Payer: Medicaid Other | Admitting: Anesthesiology

## 2011-10-19 ENCOUNTER — Inpatient Hospital Stay (HOSPITAL_COMMUNITY): Payer: Medicaid Other

## 2011-10-19 ENCOUNTER — Encounter (HOSPITAL_COMMUNITY)
Admission: EM | Disposition: A | Discharge status: Skilled Nursing Facility | Payer: Self-pay | Source: Ambulatory Visit | Attending: Internal Medicine

## 2011-10-19 ENCOUNTER — Encounter (HOSPITAL_COMMUNITY): Payer: Self-pay | Admitting: *Deleted

## 2011-10-19 ENCOUNTER — Encounter (HOSPITAL_COMMUNITY): Payer: Self-pay | Admitting: Anesthesiology

## 2011-10-19 DIAGNOSIS — I10 Essential (primary) hypertension: Secondary | ICD-10-CM

## 2011-10-19 DIAGNOSIS — R7881 Bacteremia: Secondary | ICD-10-CM

## 2011-10-19 DIAGNOSIS — M9901 Segmental and somatic dysfunction of cervical region: Secondary | ICD-10-CM | POA: Diagnosis present

## 2011-10-19 DIAGNOSIS — R51 Headache: Secondary | ICD-10-CM

## 2011-10-19 DIAGNOSIS — B958 Unspecified staphylococcus as the cause of diseases classified elsewhere: Secondary | ICD-10-CM

## 2011-10-19 DIAGNOSIS — J9600 Acute respiratory failure, unspecified whether with hypoxia or hypercapnia: Secondary | ICD-10-CM | POA: Diagnosis present

## 2011-10-19 DIAGNOSIS — A419 Sepsis, unspecified organism: Secondary | ICD-10-CM

## 2011-10-19 DIAGNOSIS — M9981 Other biomechanical lesions of cervical region: Secondary | ICD-10-CM

## 2011-10-19 HISTORY — PX: ANTERIOR CERVICAL DECOMP/DISCECTOMY FUSION: SHX1161

## 2011-10-19 LAB — CSF CELL COUNT WITH DIFFERENTIAL
Lymphs, CSF: 16 % — ABNORMAL LOW (ref 40–80)
Monocyte-Macrophage-Spinal Fluid: 16 % (ref 15–45)
Monocyte-Macrophage-Spinal Fluid: 9 % — ABNORMAL LOW (ref 15–45)
Segmented Neutrophils-CSF: 68 % — ABNORMAL HIGH (ref 0–6)
WBC, CSF: 46 /mm3 (ref 0–5)

## 2011-10-19 LAB — GRAM STAIN: Special Requests: NORMAL

## 2011-10-19 LAB — BLOOD GAS, ARTERIAL
Acid-base deficit: 0.3 mmol/L (ref 0.0–2.0)
Bicarbonate: 23 mEq/L (ref 20.0–24.0)
Drawn by: 308601
FIO2: 0.35 %
MECHVT: 660 mL
PEEP: 5 cmH2O
Patient temperature: 98.6
RATE: 26 resp/min
TCO2: 18.1 mmol/L (ref 0–100)
pCO2 arterial: 29 mmHg — ABNORMAL LOW (ref 35.0–45.0)
pH, Arterial: 7.512 — ABNORMAL HIGH (ref 7.350–7.450)
pO2, Arterial: 107 mmHg — ABNORMAL HIGH (ref 80.0–100.0)

## 2011-10-19 LAB — CBC
Hemoglobin: 13 g/dL (ref 13.0–17.0)
MCH: 29.6 pg (ref 26.0–34.0)
MCH: 29.8 pg (ref 26.0–34.0)
MCV: 83.6 fL (ref 78.0–100.0)
Platelets: 100 10*3/uL — ABNORMAL LOW (ref 150–400)
RBC: 4.39 MIL/uL (ref 4.22–5.81)
RDW: 12.9 % (ref 11.5–15.5)

## 2011-10-19 LAB — GLUCOSE, CAPILLARY: Glucose-Capillary: 248 mg/dL — ABNORMAL HIGH (ref 70–99)

## 2011-10-19 LAB — PROTEIN AND GLUCOSE, CSF
Glucose, CSF: 88 mg/dL — ABNORMAL HIGH (ref 43–76)
Total  Protein, CSF: 127 mg/dL — ABNORMAL HIGH (ref 15–45)

## 2011-10-19 LAB — BASIC METABOLIC PANEL
CO2: 23 mEq/L (ref 19–32)
Calcium: 7.5 mg/dL — ABNORMAL LOW (ref 8.4–10.5)
Glucose, Bld: 245 mg/dL — ABNORMAL HIGH (ref 70–99)
Potassium: 3 mEq/L — ABNORMAL LOW (ref 3.5–5.1)
Sodium: 127 mEq/L — ABNORMAL LOW (ref 135–145)

## 2011-10-19 LAB — DIFFERENTIAL
Basophils Absolute: 0 10*3/uL (ref 0.0–0.1)
Eosinophils Absolute: 0 10*3/uL (ref 0.0–0.7)
Eosinophils Relative: 0 % (ref 0–5)
Lymphs Abs: 0.5 10*3/uL — ABNORMAL LOW (ref 0.7–4.0)

## 2011-10-19 LAB — PHOSPHORUS: Phosphorus: 1.3 mg/dL — ABNORMAL LOW (ref 2.3–4.6)

## 2011-10-19 SURGERY — ANTERIOR CERVICAL DECOMPRESSION/DISCECTOMY FUSION 2 LEVELS
Anesthesia: General | Site: Neck | Wound class: Clean

## 2011-10-19 MED ORDER — HEPARIN SODIUM (PORCINE) 5000 UNIT/ML IJ SOLN
5000.0000 [IU] | Freq: Three times a day (TID) | INTRAMUSCULAR | Status: DC
  Administered 2011-10-19 – 2011-10-20 (×4): 5000 [IU] via SUBCUTANEOUS
  Filled 2011-10-19 (×9): qty 1

## 2011-10-19 MED ORDER — BIOTENE DRY MOUTH MT LIQD
15.0000 mL | Freq: Four times a day (QID) | OROMUCOSAL | Status: DC
  Administered 2011-10-19 – 2011-10-30 (×21): 15 mL via OROMUCOSAL

## 2011-10-19 MED ORDER — SODIUM CHLORIDE 0.9 % IJ SOLN
3.0000 mL | Freq: Two times a day (BID) | INTRAMUSCULAR | Status: DC
  Administered 2011-10-20 – 2011-10-22 (×6): 3 mL via INTRAVENOUS

## 2011-10-19 MED ORDER — OSMOLITE 1.2 CAL PO LIQD
1000.0000 mL | ORAL | Status: DC
Start: 2011-10-19 — End: 2011-10-19

## 2011-10-19 MED ORDER — DEXTROSE 5 % IV SOLN
10.0000 mg/kg | Freq: Three times a day (TID) | INTRAVENOUS | Status: DC
  Administered 2011-10-19 – 2011-10-20 (×4): 930 mg via INTRAVENOUS
  Filled 2011-10-19 (×7): qty 18.6

## 2011-10-19 MED ORDER — DEXAMETHASONE 4 MG PO TABS
4.0000 mg | ORAL_TABLET | Freq: Four times a day (QID) | ORAL | Status: DC
  Filled 2011-10-19: qty 1

## 2011-10-19 MED ORDER — PROMOTE PO LIQD
1000.0000 mL | ORAL | Status: DC
  Administered 2011-10-19: 1000 mL
  Filled 2011-10-19 (×3): qty 1000

## 2011-10-19 MED ORDER — CHLORHEXIDINE GLUCONATE 0.12 % MT SOLN
15.0000 mL | Freq: Two times a day (BID) | OROMUCOSAL | Status: DC
  Administered 2011-10-19 – 2011-10-30 (×17): 15 mL via OROMUCOSAL
  Filled 2011-10-19 (×25): qty 15

## 2011-10-19 MED ORDER — DEXAMETHASONE SODIUM PHOSPHATE 4 MG/ML IJ SOLN
4.0000 mg | Freq: Four times a day (QID) | INTRAMUSCULAR | Status: DC
  Administered 2011-10-20 (×2): 4 mg via INTRAVENOUS
  Filled 2011-10-19: qty 1

## 2011-10-19 MED ORDER — INSULIN ASPART 100 UNIT/ML ~~LOC~~ SOLN
0.0000 [IU] | SUBCUTANEOUS | Status: DC
  Administered 2011-10-19 (×2): 3 [IU] via SUBCUTANEOUS
  Administered 2011-10-19: 5 [IU] via SUBCUTANEOUS
  Administered 2011-10-20 (×3): 3 [IU] via SUBCUTANEOUS
  Administered 2011-10-20: 5 [IU] via SUBCUTANEOUS
  Administered 2011-10-20: 3 [IU] via SUBCUTANEOUS
  Administered 2011-10-20: 5 [IU] via SUBCUTANEOUS
  Administered 2011-10-20 – 2011-10-21 (×3): 3 [IU] via SUBCUTANEOUS

## 2011-10-19 MED ORDER — HYDROMORPHONE HCL PF 1 MG/ML IJ SOLN: 1.0000 mg | INTRAMUSCULAR | Status: DC | PRN

## 2011-10-19 MED ORDER — SODIUM CHLORIDE 0.9 % IV SOLN
2500.0000 ug | INTRAVENOUS | Status: DC | PRN
  Administered 2011-10-19: 75 ug/h via INTRAVENOUS

## 2011-10-19 MED ORDER — FENTANYL BOLUS VIA INFUSION
50.0000 ug | Freq: Four times a day (QID) | INTRAVENOUS | Status: DC | PRN
  Filled 2011-10-19: qty 100

## 2011-10-19 MED ORDER — POTASSIUM CHLORIDE 10 MEQ/100ML IV SOLN
10.0000 meq | INTRAVENOUS | Status: AC
  Administered 2011-10-19 (×4): 10 meq via INTRAVENOUS
  Filled 2011-10-19 (×4): qty 100

## 2011-10-19 MED ORDER — GADOBENATE DIMEGLUMINE 529 MG/ML IV SOLN
20.0000 mL | Freq: Once | INTRAVENOUS | Status: AC | PRN
  Administered 2011-10-19: 20 mL via INTRAVENOUS

## 2011-10-19 MED ORDER — THROMBIN 20000 UNITS EX KIT
PACK | CUTANEOUS | Status: DC | PRN
  Administered 2011-10-19: 20:00:00 via TOPICAL

## 2011-10-19 MED ORDER — SODIUM CHLORIDE 0.9 % IV SOLN: 250.0000 mL | INTRAVENOUS | Status: DC

## 2011-10-19 MED ORDER — SODIUM CHLORIDE 0.9 % IR SOLN
Status: DC | PRN
  Administered 2011-10-19: 20:00:00

## 2011-10-19 MED ORDER — 0.9 % SODIUM CHLORIDE (POUR BTL) OPTIME
TOPICAL | Status: DC | PRN
  Administered 2011-10-19: 1000 mL

## 2011-10-19 MED ORDER — SODIUM CHLORIDE 0.9 % IV SOLN
INTRAVENOUS | Status: DC | PRN
  Administered 2011-10-19: 20:00:00 via INTRAVENOUS

## 2011-10-19 MED ORDER — DEXAMETHASONE SODIUM PHOSPHATE 4 MG/ML IJ SOLN: 12.0000 mg | INTRAMUSCULAR | Status: DC

## 2011-10-19 MED ORDER — OXYCODONE-ACETAMINOPHEN 5-325 MG PO TABS
1.0000 | ORAL_TABLET | ORAL | Status: DC | PRN
  Administered 2011-10-20: 2 via ORAL
  Filled 2011-10-19: qty 2

## 2011-10-19 MED ORDER — BACITRACIN 50000 UNITS IM SOLR
INTRAMUSCULAR | Status: AC
  Filled 2011-10-19: qty 1

## 2011-10-19 MED ORDER — ACETAMINOPHEN 650 MG RE SUPP: 650.0000 mg | RECTAL | Status: DC | PRN

## 2011-10-19 MED ORDER — SODIUM CHLORIDE 0.9 % IJ SOLN: 3.0000 mL | INTRAMUSCULAR | Status: DC | PRN

## 2011-10-19 MED ORDER — ROCURONIUM BROMIDE 100 MG/10ML IV SOLN
INTRAVENOUS | Status: DC | PRN
  Administered 2011-10-19: 50 mg via INTRAVENOUS
  Administered 2011-10-19: 20 mg via INTRAVENOUS

## 2011-10-19 MED ORDER — FENTANYL CITRATE 0.05 MG/ML IJ SOLN
INTRAMUSCULAR | Status: DC | PRN
  Administered 2011-10-19: 150 ug via INTRAVENOUS

## 2011-10-19 MED ORDER — DEXAMETHASONE SODIUM PHOSPHATE 4 MG/ML IJ SOLN
10.0000 mg | Freq: Four times a day (QID) | INTRAMUSCULAR | Status: DC
  Administered 2011-10-19 – 2011-10-20 (×2): 10 mg via INTRAVENOUS
  Filled 2011-10-19: qty 2.5
  Filled 2011-10-19: qty 3
  Filled 2011-10-19 (×2): qty 2.5
  Filled 2011-10-19: qty 1
  Filled 2011-10-19: qty 3
  Filled 2011-10-19 (×2): qty 2.5

## 2011-10-19 MED ORDER — SODIUM CHLORIDE 0.9 % IV SOLN
INTRAVENOUS | Status: AC
  Filled 2011-10-19: qty 500

## 2011-10-19 MED ORDER — INSULIN GLARGINE 100 UNIT/ML ~~LOC~~ SOLN
5.0000 [IU] | Freq: Every day | SUBCUTANEOUS | Status: DC
  Administered 2011-10-20: 5 [IU] via SUBCUTANEOUS

## 2011-10-19 MED ORDER — PROPOFOL 10 MG/ML IV EMUL
INTRAVENOUS | Status: DC | PRN
  Administered 2011-10-19: 50 ug/kg/min via INTRAVENOUS

## 2011-10-19 MED ORDER — PANTOPRAZOLE SODIUM 40 MG IV SOLR: 40.0000 mg | Freq: Every day | INTRAVENOUS | Status: DC

## 2011-10-19 MED ORDER — SODIUM CHLORIDE 0.9 % IV SOLN
50.0000 ug/h | INTRAVENOUS | Status: DC
  Administered 2011-10-19: 50 ug/h via INTRAVENOUS
  Administered 2011-10-20: 100 ug/h via INTRAVENOUS
  Filled 2011-10-19 (×2): qty 50

## 2011-10-19 MED ORDER — POTASSIUM PHOSPHATE DIBASIC 3 MMOLE/ML IV SOLN
30.0000 mmol | Freq: Once | INTRAVENOUS | Status: AC
  Administered 2011-10-19: 30 mmol via INTRAVENOUS
  Filled 2011-10-19: qty 10

## 2011-10-19 MED ORDER — LACTATED RINGERS IV SOLN
INTRAVENOUS | Status: DC | PRN
  Administered 2011-10-19 (×2): via INTRAVENOUS

## 2011-10-19 MED ORDER — ONDANSETRON HCL 4 MG/2ML IJ SOLN: 4.0000 mg | INTRAMUSCULAR | Status: DC | PRN

## 2011-10-19 MED ORDER — PANTOPRAZOLE SODIUM 40 MG PO PACK
40.0000 mg | PACK | Freq: Every day | ORAL | Status: DC
  Administered 2011-10-19: 40 mg
  Filled 2011-10-19 (×3): qty 20

## 2011-10-19 MED ORDER — SODIUM CHLORIDE 0.9 % IV SOLN: 250.0000 mL | INTRAVENOUS | Status: DC | PRN

## 2011-10-19 MED ORDER — ACETAMINOPHEN 325 MG PO TABS: 650.0000 mg | ORAL_TABLET | ORAL | Status: DC | PRN

## 2011-10-19 SURGICAL SUPPLY — 55 items
ALLOGRAFT CERV LORD 11X14X5 (Bone Implant) ×2 IMPLANT
ALLOGRAFT CERV LORD 11X14X6 (Bone Implant) ×2 IMPLANT
APL SKNCLS STERI-STRIP NONHPOA (GAUZE/BANDAGES/DRESSINGS) ×2
BAG DECANTER FOR FLEXI CONT (MISCELLANEOUS) ×3 IMPLANT
BENZOIN TINCTURE PRP APPL 2/3 (GAUZE/BANDAGES/DRESSINGS) ×5 IMPLANT
BRUSH SCRUB EZ PLAIN DRY (MISCELLANEOUS) ×3 IMPLANT
CANISTER SUCTION 2500CC (MISCELLANEOUS) ×3 IMPLANT
CLOTH BEACON ORANGE TIMEOUT ST (SAFETY) ×3 IMPLANT
CONT SPEC 4OZ CLIKSEAL STRL BL (MISCELLANEOUS) ×5 IMPLANT
DRAPE C-ARM 42X72 X-RAY (DRAPES) ×6 IMPLANT
DRAPE LAPAROTOMY 100X72 PEDS (DRAPES) ×3 IMPLANT
DRAPE MICROSCOPE LEICA (MISCELLANEOUS) ×2 IMPLANT
DRAPE MICROSCOPE ZEISS OPMI (DRAPES) ×1 IMPLANT
DRAPE POUCH INSTRU U-SHP 10X18 (DRAPES) ×3 IMPLANT
DRAPE SURG 17X23 STRL (DRAPES) ×6 IMPLANT
DRESSING TELFA 8X3 (GAUZE/BANDAGES/DRESSINGS) ×5 IMPLANT
ELECT COATED BLADE 2.86 ST (ELECTRODE) ×3 IMPLANT
ELECT REM PT RETURN 9FT ADLT (ELECTROSURGICAL) ×3
ELECTRODE REM PT RTRN 9FT ADLT (ELECTROSURGICAL) ×2 IMPLANT
GAUZE SPONGE 4X4 16PLY XRAY LF (GAUZE/BANDAGES/DRESSINGS) IMPLANT
GLOVE BIO SURGEON STRL SZ 6.5 (GLOVE) ×2 IMPLANT
GLOVE BIOGEL M 7.0 STRL (GLOVE) ×4 IMPLANT
GLOVE ECLIPSE 7.5 STRL STRAW (GLOVE) ×3 IMPLANT
GLOVE EXAM NITRILE LRG STRL (GLOVE) IMPLANT
GLOVE EXAM NITRILE XL STR (GLOVE) IMPLANT
GLOVE EXAM NITRILE XS STR PU (GLOVE) IMPLANT
GLOVE INDICATOR 7.5 STRL GRN (GLOVE) ×2 IMPLANT
GOWN BRE IMP SLV AUR LG STRL (GOWN DISPOSABLE) ×2 IMPLANT
GOWN BRE IMP SLV AUR XL STRL (GOWN DISPOSABLE) ×3 IMPLANT
GOWN STRL REIN 2XL LVL4 (GOWN DISPOSABLE) ×3 IMPLANT
HEAD HALTER (SOFTGOODS) ×1 IMPLANT
KIT BASIN OR (CUSTOM PROCEDURE TRAY) ×3 IMPLANT
KIT ROOM TURNOVER OR (KITS) ×3 IMPLANT
NDL SPNL 20GX3.5 QUINCKE YW (NEEDLE) ×1 IMPLANT
NEEDLE SPNL 20GX3.5 QUINCKE YW (NEEDLE) ×3 IMPLANT
NS IRRIG 1000ML POUR BTL (IV SOLUTION) ×3 IMPLANT
PACK LAMINECTOMY NEURO (CUSTOM PROCEDURE TRAY) ×3 IMPLANT
PAD ARMBOARD 7.5X6 YLW CONV (MISCELLANEOUS) ×3 IMPLANT
PATTIES SURGICAL .25X.25 (GAUZE/BANDAGES/DRESSINGS) IMPLANT
PATTIES SURGICAL .75X.75 (GAUZE/BANDAGES/DRESSINGS) ×1 IMPLANT
RUBBERBAND STERILE (MISCELLANEOUS) ×6 IMPLANT
SPONGE GAUZE 4X4 12PLY (GAUZE/BANDAGES/DRESSINGS) ×3 IMPLANT
SPONGE INTESTINAL PEANUT (DISPOSABLE) ×3 IMPLANT
SPONGE SURGIFOAM ABS GEL SZ50 (HEMOSTASIS) ×3 IMPLANT
STRIP CLOSURE SKIN 1/2X4 (GAUZE/BANDAGES/DRESSINGS) ×3 IMPLANT
SUT PDS AB 5-0 P3 18 (SUTURE) ×3 IMPLANT
SUT VIC AB 3-0 CP2 18 (SUTURE) ×3 IMPLANT
SYR 20ML ECCENTRIC (SYRINGE) IMPLANT
TAPE CLOTH 3X10 TAN LF (GAUZE/BANDAGES/DRESSINGS) ×2 IMPLANT
TAPE STRIPS DRAPE STRL (GAUZE/BANDAGES/DRESSINGS) ×2 IMPLANT
TOOL MATCHSTK 3MM (MISCELLANEOUS) ×3 IMPLANT
TOWEL OR 17X24 6PK STRL BLUE (TOWEL DISPOSABLE) ×3 IMPLANT
TOWEL OR 17X26 10 PK STRL BLUE (TOWEL DISPOSABLE) ×3 IMPLANT
TRAP SPECIMEN MUCOUS 40CC (MISCELLANEOUS) IMPLANT
WATER STERILE IRR 1000ML POUR (IV SOLUTION) ×3 IMPLANT

## 2011-10-19 NOTE — H&P (Signed)
Name: Charles Ball MRN: [id number] DOB: 1950/08/19    LOS: 1  PULMONARY / CRITICAL CARE MEDICINE  Brief patient description:  61 yo M with presumed meningitis, mild rhabdo.  Events Since Admission: Intubated 6/1 LP 6/1  Current Status: Critical  Vital Signs: Temp:  [100.3 F (37.9 C)-102.5 F (39.2 C)] 101.7 F (38.7 C) (06/02 0400) Pulse Rate:  [83-109] 83  (06/02 0700) Resp:  [17-41] 25  (06/02 0700) BP: (86-175)/(42-110) 112/73 mmHg (06/02 0700) SpO2:  [93 %-100 %] 99 % (06/02 0700) FiO2 (%):  [39.4 %-100 %] 40.2 % (06/02 0700) Weight:  [108.7 kg (239 lb 10.2 oz)-109 kg (240 lb 4.8 oz)] 108.7 kg (239 lb 10.2 oz) (06/01 2317)  Physical Examination: General:  Intubated Neuro:  Sedated, rass -2, simple commands HEENT:  PERRL, pink conjunctivae, moist membranes Neck:  Supple, no JVD   Cardiovascular:  RRR, no M/R/G Lungs:  Bilateral diminished air entry Abdomen:  Soft, nontender, nondistended, bowel sounds present Musculoskeletal:  Moves all extremities, no pedal edema Skin:  No rash  Active Problems:  * No active hospital problems. *    ASSESSMENT AND PLAN  PULMONARY  Lab 10/19/11 0514 10/18/11 2155  PHART 7.512* 7.441  PCO2ART 29.0* 33.3*  PO2ART 107.0* 386.0*  HCO3 23.0 22.3  O2SAT 98.6 99.6   Ventilator Settings: Vent Mode:  [-] PRVC FiO2 (%):  [39.4 %-100 %] 40.2 % Set Rate:  [26 bmp] 26 bmp Vt Set:  [450 mL] 450 mL PEEP:  [4.5 cmH20-5.2 cmH20] 5.2 cmH20 Plateau Pressure:  [14 cmH20] 14 cmH20 CXR:  6/1 ETT in good position, no infiltrates ETT:  6/1 >>>  A:  Ventilator dependent respiratory failure P:   Abg reviewed, reduce rate 14 After reduction rate, attempt SBT if sedation reduced successfully pcxr in am  Goal cpap 5 ps 5   CARDIOVASCULAR  Lab 10/18/11 2115 10/18/11 1910  TROPONINI -- <0.30  LATICACIDVEN 2.9* --  PROBNP -- --   ECG:  Sinus tachcardia Lines: PIV  A: Hemodynamically stable currently P:  Monitor hemodynamics,  tele  RENAL  Lab 10/19/11 0333 10/18/11 1910  NA 127* 123*  K 3.0* 4.1  CL 94* 88*  CO2 23 21  BUN 24* 28*  CREATININE 0.75 0.73  CALCIUM 7.5* 8.6  MG 2.0 --  PHOS 1.3* --   Intake/Output      06/01 0701 - 06/02 0700 06/02 0701 - 06/03 0700   I.V. (mL/kg) 1076.6 (9.9)    IV Piggyback 1078.6    Total Intake(mL/kg) 2155.2 (19.8)    Urine (mL/kg/hr) 810 (0.3)    Total Output 810    Net +1345.2         Stool Occurrence 2 x     Foley:  6/1 >>>  A:  Hypokalemia, hypophospahtemia P:   Avoid free water until MRI done No edema on CT noted Replace K   GASTROINTESTINAL  Lab 10/18/11 1910  AST 53*  ALT 33  ALKPHOS 75  BILITOT 1.1  PROT 8.0  ALBUMIN 3.3*    A:  No issues P:   STARt TF  ppi lft in t while on acyclovir  HEMATOLOGIC  Lab 10/19/11 0333 10/18/11 1910  HGB 13.0 16.0  HCT 37.0* 43.8  PLT 95* 113*  INR -- --  APTT -- --   A:  DVt prevention P: sub q hep   INFECTIOUS  Lab 10/19/11 0333 10/18/11 2115 10/18/11 1910  WBC 11.1* -- 12.9*  PROCALCITON -- 2.09 --  Cultures: Blood 6/1 >>> Urine 6/1 >>> CSF 6/1 >>> Antibiotics: Vanc 6/1 >>> Ceftiraxone 6/1 >>> Ampicillin 6/1 >>>  A:  Presumed meningitis (unimpressive WBC for bacterial), r/o encephaltitis P:   -CT normal with the exception of acute sinusitis with air and fluid in sinus. -differential includes bacterial (most likely given sinusitis), viral meningitis, viral meningoencephalitis (e.g. West Nile), tick-borne illness (e.g. RMSF).  Continue current antimicrobial coverage, consider acyclovir and doxycycline if no improvement. ID called and much appreciated Can we narrow some of the bacterial coverage? MRI assess temporal changes Decadron x 4 days likely to complete with air fluid level and hint bacterial origin?  ENDOCRINE  Lab 10/18/11 1831  GLUCAP 161*   A:  Hyperglycemia   P:   Aggressive glucose control, ssi, lantus with TF start  NEUROLOGIC  A:  Altered mental  status P:   See ID Propofol, fent WUA  BEST PRACTICE / DISPOSITION - Level of Care:  ICU - Primary Service:  PCCM - Consultants:  None - Code Status:  Full - Diet:  NPO, tube feeds when appropriate - DVT Px:  Heparin - GI Px:  Protonix - Skin Integrity:  Good - Social / Family:  Girlfriend updated at bedside  The patient is critically ill with multiple organ systems failure and requires high complexity decision making for assessment and support, frequent evaluation and titration of therapies, application of advanced monitoring technologies and extensive interpretation of multiple databases.   Critical Care Time devoted to patient care services described in this note is: 35 min   Mcarthur Rossetti. Tyson Alias, MD, FACP Pgr: [phone] [facility]

## 2011-10-19 NOTE — Progress Notes (Signed)
After sedation reduction Limited upper ext movement and NO lower ext movement  Will assess MRI NECK now as well, and possibly other thoracic   Less and less impressed csf issue, more impressed to r/o spinal , epidural etc Will order stat  Mcarthur Rossetti. Tyson Alias, MD, FACP Pgr: [phone] [facility]

## 2011-10-19 NOTE — Consult Note (Signed)
Reason for Consult: Spinal cord compression Referring Physician: Critical care medicine  Charles Ball is an 61 y.o. male.  HPI: The patient is a 61 year old male who was recently admitted for alteration of mental status. He's had a workup imaging studies of the brain which shows some dural enhancement and no mass. Lumbar puncture showed some white cells and he was being treated for possible meningitis. Today he was noted to have decreased movement of all 4 extremities an MRI scan of the cervical spine was obtained. This showed some disc herniation C4-5 and C5-6 with spinal cord compression at both levels. There is no definite evidence of myelopathy and neurosurgical consultation was requested. After discussing the options evaluating the situation it was elected to take the patient to the operating room for a two-level anterior cervical discectomy with fusion and plating.  Past Medical History  Diagnosis Date  . Hypertension     History reviewed. No pertinent past surgical history.  History reviewed. No pertinent family history.  Social History:  does not have a smoking history on file. He does not have any smokeless tobacco history on file. His alcohol and drug histories not on file.  Allergies: No Known Allergies  Medications: Not reviewed  Results for orders placed during the hospital encounter of 10/18/11 (from the past 48 hour(s))  GLUCOSE, CAPILLARY     Status: Abnormal   Collection Time   10/18/11  6:31 PM      Component Value Range Comment   Glucose-Capillary 161 (*) 70 - 99 (mg/dL)   CULTURE, BLOOD (ROUTINE X 2)     Status: Normal (Preliminary result)   Collection Time   10/18/11  6:55 PM      Component Value Range Comment   Specimen Description BLOOD RIGHT ARM  5 ML IN [facility] BOTTLE      Special Requests NONE      Culture  Setup Time [id number]      Culture        Value: GRAM POSITIVE COCCI IN CLUSTERS     Note: Gram Stain Report Called to,Read Back By and Verified With:  RN S. DILLON ON 10/19/11 AT 1500 BY DTERRY   Report Status PENDING     CBC     Status: Abnormal   Collection Time   10/18/11  7:10 PM      Component Value Range Comment   WBC 12.9 (*) 4.0 - 10.5 (K/uL)    RBC 5.23  4.22 - 5.81 (MIL/uL)    Hemoglobin 16.0  13.0 - 17.0 (g/dL)    HCT 95.6  21.3 - 08.6 (%)    MCV 83.7  78.0 - 100.0 (fL)    MCH 30.6  26.0 - 34.0 (pg)    MCHC 36.5 (*) 30.0 - 36.0 (g/dL)    RDW 57.8  46.9 - 62.9 (%)    Platelets 113 (*) 150 - 400 (K/uL)   DIFFERENTIAL     Status: Abnormal   Collection Time   10/18/11  7:10 PM      Component Value Range Comment   Neutrophils Relative 82 (*) 43 - 77 (%)    Lymphocytes Relative 5 (*) 12 - 46 (%)    Monocytes Relative 13 (*) 3 - 12 (%)    Eosinophils Relative 0  0 - 5 (%)    Basophils Relative 0  0 - 1 (%)    Neutro Abs 10.6 (*) 1.7 - 7.7 (K/uL)    Lymphs Abs 0.6 (*) 0.7 - 4.0 (  K/uL)    Monocytes Absolute 1.7 (*) 0.1 - 1.0 (K/uL)    Eosinophils Absolute 0.0  0.0 - 0.7 (K/uL)    Basophils Absolute 0.0  0.0 - 0.1 (K/uL)    Smear Review PLATELET COUNT CONFIRMED BY SMEAR     COMPREHENSIVE METABOLIC PANEL     Status: Abnormal   Collection Time   10/18/11  7:10 PM      Component Value Range Comment   Sodium 123 (*) 135 - 145 (mEq/L)    Potassium 4.1  3.5 - 5.1 (mEq/L) SLIGHT HEMOLYSIS   Chloride 88 (*) 96 - 112 (mEq/L)    CO2 21  19 - 32 (mEq/L)    Glucose, Bld 163 (*) 70 - 99 (mg/dL)    BUN 28 (*) 6 - 23 (mg/dL)    Creatinine, Ser 1.61  0.50 - 1.35 (mg/dL)    Calcium 8.6  8.4 - 10.5 (mg/dL)    Total Protein 8.0  6.0 - 8.3 (g/dL)    Albumin 3.3 (*) 3.5 - 5.2 (g/dL)    AST 53 (*) 0 - 37 (U/L) SLIGHT HEMOLYSIS   ALT 33  0 - 53 (U/L)    Alkaline Phosphatase 75  39 - 117 (U/L)    Total Bilirubin 1.1  0.3 - 1.2 (mg/dL)    GFR calc non Af Amer >90  >90 (mL/min)    GFR calc Af Amer >90  >90 (mL/min)   CARDIAC PANEL(CRET KIN+CKTOT+MB+TROPI)     Status: Abnormal   Collection Time   10/18/11  7:10 PM      Component Value Range  Comment   Total CK 1402 (*) 7 - 232 (U/L)    CK, MB 7.2 (*) 0.3 - 4.0 (ng/mL)    Troponin I <0.30  <0.30 (ng/mL)    Relative Index 0.5  0.0 - 2.5    ETHANOL     Status: Normal   Collection Time   10/18/11  7:10 PM      Component Value Range Comment   Alcohol, Ethyl (B) <11  0 - 11 (mg/dL)   URINALYSIS, ROUTINE W REFLEX MICROSCOPIC     Status: Abnormal   Collection Time   10/18/11  8:14 PM      Component Value Range Comment   Color, Urine AMBER (*) YELLOW  BIOCHEMICALS MAY BE AFFECTED BY COLOR   APPearance CLOUDY (*) CLEAR     Specific Gravity, Urine 1.032 (*) 1.005 - 1.030     pH 6.0  5.0 - 8.0     Glucose, UA NEGATIVE  NEGATIVE (mg/dL)    Hgb urine dipstick LARGE (*) NEGATIVE     Bilirubin Urine SMALL (*) NEGATIVE     Ketones, ur TRACE (*) NEGATIVE (mg/dL)    Protein, ur 096 (*) NEGATIVE (mg/dL)    Urobilinogen, UA 1.0  0.0 - 1.0 (mg/dL)    Nitrite NEGATIVE  NEGATIVE     Leukocytes, UA TRACE (*) NEGATIVE    URINE RAPID DRUG SCREEN (HOSP PERFORMED)     Status: Abnormal   Collection Time   10/18/11  8:14 PM      Component Value Range Comment   Opiates NONE DETECTED  NONE DETECTED     Cocaine NONE DETECTED  NONE DETECTED     Benzodiazepines POSITIVE (*) NONE DETECTED     Amphetamines NONE DETECTED  NONE DETECTED     Tetrahydrocannabinol NONE DETECTED  NONE DETECTED     Barbiturates NONE DETECTED  NONE DETECTED    URINE MICROSCOPIC-ADD  ON     Status: Normal   Collection Time   10/18/11  8:14 PM      Component Value Range Comment   WBC, UA 0-2  <3 (WBC/hpf)    Urine-Other MUCOUS PRESENT     LACTIC ACID, PLASMA     Status: Abnormal   Collection Time   10/18/11  9:15 PM      Component Value Range Comment   Lactic Acid, Venous 2.9 (*) 0.5 - 2.2 (mmol/L)   PROCALCITONIN     Status: Normal   Collection Time   10/18/11  9:15 PM      Component Value Range Comment   Procalcitonin 2.09     BLOOD GAS, ARTERIAL     Status: Abnormal   Collection Time   10/18/11  9:55 PM      Component Value  Range Comment   FIO2 1.00      Delivery systems VENTILATOR      Mode PRESSURE REGULATED VOLUME CONTROL      VT 0.450      Rate 26      Peep/cpap 5.0      pH, Arterial 7.441  7.350 - 7.450     pCO2 arterial 33.3 (*) 35.0 - 45.0 (mmHg)    pO2, Arterial 386.0 (*) 80.0 - 100.0 (mmHg)    Bicarbonate 22.3  20.0 - 24.0 (mEq/L)    TCO2 19.5  0 - 100 (mmol/L)    Acid-base deficit 0.7  0.0 - 2.0 (mmol/L)    O2 Saturation 99.6      Patient temperature 98.6      Collection site RIGHT RADIAL      Drawn by [id number]      Sample type ARTERIAL DRAW      Allens test (pass/fail) PASS  PASS    PROTEIN AND GLUCOSE, CSF     Status: Abnormal   Collection Time   10/18/11 11:45 PM      Component Value Range Comment   Glucose, CSF 88 (*) 43 - 76 (mg/dL)    Total  Protein, CSF 127 (*) 15 - 45 (mg/dL)   GRAM STAIN     Status: Normal   Collection Time   10/18/11 11:45 PM      Component Value Range Comment   Specimen Description CSF      Special Requests Normal      Gram Stain        Value: WBC PRESENT,BOTH PMN AND MONONUCLEAR     NO ORGANISMS SEEN     CYTO SPUN SLIDE     Gram Stain Report Called to,Read Back By and Verified With: TWOODS RN AT 0141 ON [id number] BY DLONG   Report Status 10/19/2011 FINAL     CSF CELL COUNT WITH DIFFERENTIAL     Status: Abnormal   Collection Time   10/18/11 11:45 PM      Component Value Range Comment   Tube # 1      Color, CSF COLORLESS  COLORLESS     Appearance, CSF CLEAR  CLEAR     Supernatant NOT INDICATED      RBC Count, CSF 22 (*) 0 (/cu mm)    WBC, CSF 12 (*) 0 - 5 (/cu mm)    Segmented Neutrophils-CSF 68 (*) 0 - 6 (%)    Lymphs, CSF 16 (*) 40 - 80 (%)    Monocyte-Macrophage-Spinal Fluid 16  15 - 45 (%)   CSF CELL COUNT WITH DIFFERENTIAL     Status: Abnormal  Collection Time   10/18/11 11:45 PM      Component Value Range Comment   Tube # 4      Color, CSF COLORLESS  COLORLESS     Appearance, CSF CLEAR  CLEAR     Supernatant NOT INDICATED      RBC Count, CSF 42 (*)  0 (/cu mm)    WBC, CSF 46 (*) 0 - 5 (/cu mm)    Segmented Neutrophils-CSF 86 (*) 0 - 6 (%)    Lymphs, CSF 5 (*) 40 - 80 (%)    Monocyte-Macrophage-Spinal Fluid 9 (*) 15 - 45 (%)    Other Cells, CSF RARE NRBCs     MRSA PCR SCREENING     Status: Normal   Collection Time   10/19/11 12:23 AM      Component Value Range Comment   MRSA by PCR NEGATIVE  NEGATIVE    CBC     Status: Abnormal   Collection Time   10/19/11  3:33 AM      Component Value Range Comment   WBC 11.1 (*) 4.0 - 10.5 (K/uL)    RBC 4.39  4.22 - 5.81 (MIL/uL)    Hemoglobin 13.0  13.0 - 17.0 (g/dL)    HCT 40.9 (*) 81.1 - 52.0 (%)    MCV 84.3  78.0 - 100.0 (fL)    MCH 29.6  26.0 - 34.0 (pg)    MCHC 35.1  30.0 - 36.0 (g/dL)    RDW 91.4  78.2 - 95.6 (%)    Platelets 95 (*) 150 - 400 (K/uL)   BASIC METABOLIC PANEL     Status: Abnormal   Collection Time   10/19/11  3:33 AM      Component Value Range Comment   Sodium 127 (*) 135 - 145 (mEq/L)    Potassium 3.0 (*) 3.5 - 5.1 (mEq/L) DELTA CHECK NOTED   Chloride 94 (*) 96 - 112 (mEq/L)    CO2 23  19 - 32 (mEq/L)    Glucose, Bld 245 (*) 70 - 99 (mg/dL)    BUN 24 (*) 6 - 23 (mg/dL)    Creatinine, Ser 2.13  0.50 - 1.35 (mg/dL)    Calcium 7.5 (*) 8.4 - 10.5 (mg/dL)    GFR calc non Af Amer >90  >90 (mL/min)    GFR calc Af Amer >90  >90 (mL/min)   MAGNESIUM     Status: Normal   Collection Time   10/19/11  3:33 AM      Component Value Range Comment   Magnesium 2.0  1.5 - 2.5 (mg/dL)   PHOSPHORUS     Status: Abnormal   Collection Time   10/19/11  3:33 AM      Component Value Range Comment   Phosphorus 1.3 (*) 2.3 - 4.6 (mg/dL)   BLOOD GAS, ARTERIAL     Status: Abnormal   Collection Time   10/19/11  5:14 AM      Component Value Range Comment   FIO2 0.40      Delivery systems VENTILATOR      Mode PRESSURE REGULATED VOLUME CONTROL      VT 450      Rate 26      Peep/cpap 5.0      pH, Arterial 7.512 (*) 7.350 - 7.450     pCO2 arterial 29.0 (*) 35.0 - 45.0 (mmHg)    pO2, Arterial  107.0 (*) 80.0 - 100.0 (mmHg)    Bicarbonate 23.0  20.0 - 24.0 (mEq/L)  TCO2 20.1  0 - 100 (mmol/L)    Acid-Base Excess 1.3  0.0 - 2.0 (mmol/L)    O2 Saturation 98.6      Patient temperature 98.6      Collection site RIGHT RADIAL      Drawn by [id number]      Sample type ARTERIAL DRAW      Allens test (pass/fail) PASS  PASS    GLUCOSE, CAPILLARY     Status: Abnormal   Collection Time   10/19/11  8:42 AM      Component Value Range Comment   Glucose-Capillary 248 (*) 70 - 99 (mg/dL)    Comment 1 Documented in Chart      Comment 2 Notify RN     BLOOD GAS, ARTERIAL     Status: Abnormal   Collection Time   10/19/11  9:52 AM      Component Value Range Comment   FIO2 0.35      Delivery systems VENTILATOR      Mode PRESSURE REGULATED VOLUME CONTROL      VT 660      Rate 14      Peep/cpap 5.0      pH, Arterial 7.523 (*) 7.350 - 7.450     pCO2 arterial 25.5 (*) 35.0 - 45.0 (mmHg)    pO2, Arterial 81.3  80.0 - 100.0 (mmHg)    Bicarbonate 20.9  20.0 - 24.0 (mEq/L)    TCO2 18.1  0 - 100 (mmol/L)    Acid-base deficit 0.3  0.0 - 2.0 (mmol/L)    O2 Saturation 97.2      Patient temperature 98.6      Collection site RIGHT RADIAL      Drawn by [id number]      Sample type ARTERIAL DRAW     CBC     Status: Abnormal   Collection Time   10/19/11 10:06 AM      Component Value Range Comment   WBC 12.0 (*) 4.0 - 10.5 (K/uL)    RBC 4.39  4.22 - 5.81 (MIL/uL)    Hemoglobin 13.1  13.0 - 17.0 (g/dL)    HCT 81.1 (*) 91.4 - 52.0 (%)    MCV 83.6  78.0 - 100.0 (fL)    MCH 29.8  26.0 - 34.0 (pg)    MCHC 35.7  30.0 - 36.0 (g/dL)    RDW 78.2  95.6 - 21.3 (%)    Platelets 100 (*) 150 - 400 (K/uL) CONSISTENT WITH PREVIOUS RESULT  DIFFERENTIAL     Status: Abnormal   Collection Time   10/19/11 10:06 AM      Component Value Range Comment   Neutrophils Relative 92 (*) 43 - 77 (%)    Neutro Abs 11.1 (*) 1.7 - 7.7 (K/uL)    Lymphocytes Relative 4 (*) 12 - 46 (%)    Lymphs Abs 0.5 (*) 0.7 - 4.0 (K/uL)    Monocytes  Relative 4  3 - 12 (%)    Monocytes Absolute 0.4  0.1 - 1.0 (K/uL)    Eosinophils Relative 0  0 - 5 (%)    Eosinophils Absolute 0.0  0.0 - 0.7 (K/uL)    Basophils Relative 0  0 - 1 (%)    Basophils Absolute 0.0  0.0 - 0.1 (K/uL)   GLUCOSE, CAPILLARY     Status: Abnormal   Collection Time   10/19/11 11:58 AM      Component Value Range Comment   Glucose-Capillary 196 (*) 70 - 99 (  mg/dL)    Comment 1 Notify RN      Comment 2 Documented in Chart     GLUCOSE, CAPILLARY     Status: Abnormal   Collection Time   10/19/11  6:17 PM      Component Value Range Comment   Glucose-Capillary 194 (*) 70 - 99 (mg/dL)    Comment 1 Documented in Chart      Comment 2 Notify RN       Ct Head Wo Contrast  10/18/2011  *RADIOLOGY REPORT*  Clinical Data: 61 year old male found down.  Weakness.  CT HEAD WITHOUT CONTRAST  Technique:  Contiguous axial images were obtained from the base of the skull through the vertex without contrast.  Comparison: None.  Findings: Mixed layering and bubbly opacity in the right maxillary sinus.  Other Visualized paranasal sinuses and mastoids are clear. No acute osseous abnormality identified.  Visualized orbits and scalp soft tissues are within normal limits.  Calcified atherosclerosis at the skull base.  No suspicious intracranial vascular hyperdensity.  No ventriculomegaly. No midline shift, mass effect, or evidence of mass lesion.  No acute intracranial hemorrhage identified.  No evidence of cortically based acute infarction identified.  IMPRESSION: 1.  Normal for age noncontrast CT appearance of the brain. 2.  Right maxillary sinusitis.  Original Report Authenticated By: Harley Hallmark, M.D.   Mr Laqueta Jean Wo Contrast  10/19/2011  **ADDENDUM** CREATED: 10/19/2011 17:45:12  These results were called by telephone on 10/19/2011  at  05:45 p.m. to  Judeth Cornfield, RN, ICU, who verbally acknowledged these results.  **END ADDENDUM** SIGNED BY: Chauncey Fischer, M.D.   10/19/2011  *RADIOLOGY  REPORT*  Clinical Data:  Left-sided weakness.  Status post fall lethargy. Evaluate for epidural abscess.  MRI HEAD WITHOUT AND WITH CONTRAST MRI CERVICAL AND THORACIC SPINE WITHOUT AND WITH CONTRAST  Technique:  Multiplanar, multiecho pulse sequences of the brain and surrounding structures, the cervical spine, to include the craniocervical junction and cervicothoracic junction, and the thoracic spine, were obtained without and with intravenous contrast.  Contrast: 20mL MULTIHANCE GADOBENATE DIMEGLUMINE 529 MG/ML IV SOLN  Comparison:  CT head without contrast is 10/19/2011.  MRI HEAD  Findings:  The diffusion weighted images demonstrate no evidence for acute or subacute infarction.  Scattered periventricular and subcortical T2 and FLAIR hyperintensities are greater than expected for age.  There is abnormal signal in the CSF on the FLAIR images. No pathologic enhancement is evident on the postcontrast images.  Flow is present in the major intracranial arteries.  The globes orbits are intact.  A fluid level is present in the right maxillary sinus.  The paranasal sinuses are otherwise clear.  Minimal fluid is present in the right mastoid air cells.  There is fluid in the nasopharynx.  The patient is intubated.  IMPRESSION:  1.  Incomplete CSF saturation over the convexities bilaterally. This may be related to the patient's recent lumbar puncture or possibly due to oxygenation.  Abnormal CSF is also considered. Elevated white cells were noted on the recent lumbar puncture. 2.  Scattered periventricular and subcortical white matter disease is advanced for age. The finding is nonspecific but can be seen in the setting of chronic microvascular ischemia, a demyelinating process such as multiple sclerosis, vasculitis, complicated migraine headaches, or as the sequelae of a prior infectious or inflammatory process. 3.  Mild sinus disease is potentially related to intubation.  MRI CERVICAL SPINE  Findings:  Normal signal is  present in the cervical spine.  The craniocervical junction is within normal limits.  Flow is present in the major vascular structures of the neck.  C2-3:  Minimal disc bulging is present.  There is no significant stenosis.  C3-4:  A leftward disc osteophyte complex effaces the ventral CSF. Mild left foraminal narrowing is secondary to disc and uncovertebral spurring.  C4-5:  A diffuse disc herniation is present.  There is superior and inferior extrusion of disc material extending 5 mm above and 6 mm below the disc level.  Moderate central canal stenosis is present. The canal is narrowed to 6 mm.  Moderate foraminal stenosis is worse on the left.  C5-6:  A broad-based disc herniation is present.  There is flattening of the ventral cord.  The canal is narrowed to 8 mm. Moderate to severe foraminal stenosis is present bilaterally.  C6-7:  No significant disc herniation or stenosis is present.  C7-T1:  No significant disc herniation or stenosis is present.  Diffuse dural enhancement is present throughout the cervical spine. There is enhancement around what is presumed to be disc at C4-5 and at C5-6.  There is some enhancement within the disc space at C5-6. No definite marrow enhancement is present. There is some enhancement of the anterior paraspinous musculature on the left at to C3-4, C4-5, and C5-6.  IMPRESSION: 1.  Diffuse dural enhancement throughout the cervical spine.  This could be related to infection, potentially from the C5-6 disc space.  Some dural enhancement can be seen normally following lumbar puncture. 2.  Large disc herniation with superior and inferior exterior extrusion at C4-5.  Enhancement surrounds the disc without a definite abscess. 3.  Broad-based disc herniation with some internal enhancement of the disc at C5-6. Infection is not excluded. 4.  Left paraspinous muscle enhancement also raises suspicion for infection. 5.  Disc bulging at C3-4 as well as uncovertebral spurring leads to mild left  foraminal narrowing. 5.  Moderate central canal stenosis at C4-5 greater than C5-6. 6.  Moderate foraminal stenosis bilaterally at C4-5. 7.  Moderate to severe foraminal stenosis bilaterally at C5-6.  MRI THORACIC SPINE  Findings: The thoracic spinal cord is within normal limits.  The conus medullaris appears to be terminating at L1 but is incompletely imaged.  The postcontrast images demonstrate some posterior dural enhancement to the level of T4.  There is mild dural enhancement at this T11 and T12 as well.  There is no abscess.  A superior endplate Schmorl's node and remote compression fracture is present at T12.  There is focal thickening of the ligamentum flavum on the left at T10-11 with slight encroachment on the CSF but no significant stenosis.  The foramina are patent bilaterally.  The postcontrast images are otherwise unremarkable.  Mild dependent atelectasis is present in the lungs bilaterally. The patient is intubated.  An NG tube is in place.  The soft tissues are otherwise unremarkable.  IMPRESSION:  1.  Mild dural enhancement without evidence for an abscess.  This is likely related to dural infection.  Such findings could be seen following recent lumbar puncture. 2.  Minimal posterior encroachment by thickening of the ligamentum flavum on the left at T10-11. Original Report Authenticated By: Jamesetta Orleans. MATTERN, M.D.   Mr Cervical Spine W Wo Contrast  10/19/2011  **ADDENDUM** CREATED: 10/19/2011 17:45:12  These results were called by telephone on 10/19/2011  at  05:45 p.m. to  Judeth Cornfield, RN, ICU, who verbally acknowledged these results.  **END ADDENDUM** SIGNED BY: Chauncey Fischer, M.D.  10/19/2011  *RADIOLOGY REPORT*  Clinical Data:  Left-sided weakness.  Status post fall lethargy. Evaluate for epidural abscess.  MRI HEAD WITHOUT AND WITH CONTRAST MRI CERVICAL AND THORACIC SPINE WITHOUT AND WITH CONTRAST  Technique:  Multiplanar, multiecho pulse sequences of the brain and surrounding  structures, the cervical spine, to include the craniocervical junction and cervicothoracic junction, and the thoracic spine, were obtained without and with intravenous contrast.  Contrast: 20mL MULTIHANCE GADOBENATE DIMEGLUMINE 529 MG/ML IV SOLN  Comparison:  CT head without contrast is 10/19/2011.  MRI HEAD  Findings:  The diffusion weighted images demonstrate no evidence for acute or subacute infarction.  Scattered periventricular and subcortical T2 and FLAIR hyperintensities are greater than expected for age.  There is abnormal signal in the CSF on the FLAIR images. No pathologic enhancement is evident on the postcontrast images.  Flow is present in the major intracranial arteries.  The globes orbits are intact.  A fluid level is present in the right maxillary sinus.  The paranasal sinuses are otherwise clear.  Minimal fluid is present in the right mastoid air cells.  There is fluid in the nasopharynx.  The patient is intubated.  IMPRESSION:  1.  Incomplete CSF saturation over the convexities bilaterally. This may be related to the patient's recent lumbar puncture or possibly due to oxygenation.  Abnormal CSF is also considered. Elevated white cells were noted on the recent lumbar puncture. 2.  Scattered periventricular and subcortical white matter disease is advanced for age. The finding is nonspecific but can be seen in the setting of chronic microvascular ischemia, a demyelinating process such as multiple sclerosis, vasculitis, complicated migraine headaches, or as the sequelae of a prior infectious or inflammatory process. 3.  Mild sinus disease is potentially related to intubation.  MRI CERVICAL SPINE  Findings:  Normal signal is present in the cervical spine.  The craniocervical junction is within normal limits.  Flow is present in the major vascular structures of the neck.  C2-3:  Minimal disc bulging is present.  There is no significant stenosis.  C3-4:  A leftward disc osteophyte complex effaces the  ventral CSF. Mild left foraminal narrowing is secondary to disc and uncovertebral spurring.  C4-5:  A diffuse disc herniation is present.  There is superior and inferior extrusion of disc material extending 5 mm above and 6 mm below the disc level.  Moderate central canal stenosis is present. The canal is narrowed to 6 mm.  Moderate foraminal stenosis is worse on the left.  C5-6:  A broad-based disc herniation is present.  There is flattening of the ventral cord.  The canal is narrowed to 8 mm. Moderate to severe foraminal stenosis is present bilaterally.  C6-7:  No significant disc herniation or stenosis is present.  C7-T1:  No significant disc herniation or stenosis is present.  Diffuse dural enhancement is present throughout the cervical spine. There is enhancement around what is presumed to be disc at C4-5 and at C5-6.  There is some enhancement within the disc space at C5-6. No definite marrow enhancement is present. There is some enhancement of the anterior paraspinous musculature on the left at to C3-4, C4-5, and C5-6.  IMPRESSION: 1.  Diffuse dural enhancement throughout the cervical spine.  This could be related to infection, potentially from the C5-6 disc space.  Some dural enhancement can be seen normally following lumbar puncture. 2.  Large disc herniation with superior and inferior exterior extrusion at C4-5.  Enhancement surrounds the disc without a definite  abscess. 3.  Broad-based disc herniation with some internal enhancement of the disc at C5-6. Infection is not excluded. 4.  Left paraspinous muscle enhancement also raises suspicion for infection. 5.  Disc bulging at C3-4 as well as uncovertebral spurring leads to mild left foraminal narrowing. 5.  Moderate central canal stenosis at C4-5 greater than C5-6. 6.  Moderate foraminal stenosis bilaterally at C4-5. 7.  Moderate to severe foraminal stenosis bilaterally at C5-6.  MRI THORACIC SPINE  Findings: The thoracic spinal cord is within normal  limits.  The conus medullaris appears to be terminating at L1 but is incompletely imaged.  The postcontrast images demonstrate some posterior dural enhancement to the level of T4.  There is mild dural enhancement at this T11 and T12 as well.  There is no abscess.  A superior endplate Schmorl's node and remote compression fracture is present at T12.  There is focal thickening of the ligamentum flavum on the left at T10-11 with slight encroachment on the CSF but no significant stenosis.  The foramina are patent bilaterally.  The postcontrast images are otherwise unremarkable.  Mild dependent atelectasis is present in the lungs bilaterally. The patient is intubated.  An NG tube is in place.  The soft tissues are otherwise unremarkable.  IMPRESSION:  1.  Mild dural enhancement without evidence for an abscess.  This is likely related to dural infection.  Such findings could be seen following recent lumbar puncture. 2.  Minimal posterior encroachment by thickening of the ligamentum flavum on the left at T10-11. Original Report Authenticated By: Jamesetta Orleans. MATTERN, M.D.   Mr Thoracic Spine W Wo Contrast  10/19/2011  **ADDENDUM** CREATED: 10/19/2011 17:45:12  These results were called by telephone on 10/19/2011  at  05:45 p.m. to  Judeth Cornfield, RN, ICU, who verbally acknowledged these results.  **END ADDENDUM** SIGNED BY: Chauncey Fischer, M.D.   10/19/2011  *RADIOLOGY REPORT*  Clinical Data:  Left-sided weakness.  Status post fall lethargy. Evaluate for epidural abscess.  MRI HEAD WITHOUT AND WITH CONTRAST MRI CERVICAL AND THORACIC SPINE WITHOUT AND WITH CONTRAST  Technique:  Multiplanar, multiecho pulse sequences of the brain and surrounding structures, the cervical spine, to include the craniocervical junction and cervicothoracic junction, and the thoracic spine, were obtained without and with intravenous contrast.  Contrast: 20mL MULTIHANCE GADOBENATE DIMEGLUMINE 529 MG/ML IV SOLN  Comparison:  CT head without  contrast is 10/19/2011.  MRI HEAD  Findings:  The diffusion weighted images demonstrate no evidence for acute or subacute infarction.  Scattered periventricular and subcortical T2 and FLAIR hyperintensities are greater than expected for age.  There is abnormal signal in the CSF on the FLAIR images. No pathologic enhancement is evident on the postcontrast images.  Flow is present in the major intracranial arteries.  The globes orbits are intact.  A fluid level is present in the right maxillary sinus.  The paranasal sinuses are otherwise clear.  Minimal fluid is present in the right mastoid air cells.  There is fluid in the nasopharynx.  The patient is intubated.  IMPRESSION:  1.  Incomplete CSF saturation over the convexities bilaterally. This may be related to the patient's recent lumbar puncture or possibly due to oxygenation.  Abnormal CSF is also considered. Elevated white cells were noted on the recent lumbar puncture. 2.  Scattered periventricular and subcortical white matter disease is advanced for age. The finding is nonspecific but can be seen in the setting of chronic microvascular ischemia, a demyelinating process such as multiple sclerosis, vasculitis,  complicated migraine headaches, or as the sequelae of a prior infectious or inflammatory process. 3.  Mild sinus disease is potentially related to intubation.  MRI CERVICAL SPINE  Findings:  Normal signal is present in the cervical spine.  The craniocervical junction is within normal limits.  Flow is present in the major vascular structures of the neck.  C2-3:  Minimal disc bulging is present.  There is no significant stenosis.  C3-4:  A leftward disc osteophyte complex effaces the ventral CSF. Mild left foraminal narrowing is secondary to disc and uncovertebral spurring.  C4-5:  A diffuse disc herniation is present.  There is superior and inferior extrusion of disc material extending 5 mm above and 6 mm below the disc level.  Moderate central canal  stenosis is present. The canal is narrowed to 6 mm.  Moderate foraminal stenosis is worse on the left.  C5-6:  A broad-based disc herniation is present.  There is flattening of the ventral cord.  The canal is narrowed to 8 mm. Moderate to severe foraminal stenosis is present bilaterally.  C6-7:  No significant disc herniation or stenosis is present.  C7-T1:  No significant disc herniation or stenosis is present.  Diffuse dural enhancement is present throughout the cervical spine. There is enhancement around what is presumed to be disc at C4-5 and at C5-6.  There is some enhancement within the disc space at C5-6. No definite marrow enhancement is present. There is some enhancement of the anterior paraspinous musculature on the left at to C3-4, C4-5, and C5-6.  IMPRESSION: 1.  Diffuse dural enhancement throughout the cervical spine.  This could be related to infection, potentially from the C5-6 disc space.  Some dural enhancement can be seen normally following lumbar puncture. 2.  Large disc herniation with superior and inferior exterior extrusion at C4-5.  Enhancement surrounds the disc without a definite abscess. 3.  Broad-based disc herniation with some internal enhancement of the disc at C5-6. Infection is not excluded. 4.  Left paraspinous muscle enhancement also raises suspicion for infection. 5.  Disc bulging at C3-4 as well as uncovertebral spurring leads to mild left foraminal narrowing. 5.  Moderate central canal stenosis at C4-5 greater than C5-6. 6.  Moderate foraminal stenosis bilaterally at C4-5. 7.  Moderate to severe foraminal stenosis bilaterally at C5-6.  MRI THORACIC SPINE  Findings: The thoracic spinal cord is within normal limits.  The conus medullaris appears to be terminating at L1 but is incompletely imaged.  The postcontrast images demonstrate some posterior dural enhancement to the level of T4.  There is mild dural enhancement at this T11 and T12 as well.  There is no abscess.  A superior  endplate Schmorl's node and remote compression fracture is present at T12.  There is focal thickening of the ligamentum flavum on the left at T10-11 with slight encroachment on the CSF but no significant stenosis.  The foramina are patent bilaterally.  The postcontrast images are otherwise unremarkable.  Mild dependent atelectasis is present in the lungs bilaterally. The patient is intubated.  An NG tube is in place.  The soft tissues are otherwise unremarkable.  IMPRESSION:  1.  Mild dural enhancement without evidence for an abscess.  This is likely related to dural infection.  Such findings could be seen following recent lumbar puncture. 2.  Minimal posterior encroachment by thickening of the ligamentum flavum on the left at T10-11. Original Report Authenticated By: Jamesetta Orleans. MATTERN, M.D.   Dg Chest [facility]  10/18/2011  *RADIOLOGY REPORT*  Clinical Data: Evaluate endotracheal tube.  Hypertension.  PORTABLE CHEST - 1 VIEW  Comparison: 10/18/2011 at the 1953 hours  Findings: Interval placement of endotracheal tube with tip about 4.8 cm above the carina.  An enteric tube has been placed.  The tip is not visualized but is below the left hemidiaphragm consistent with location at least in the stomach.  Shallow inspiration.  Mild cardiac enlargement with suggestion of mild pulmonary vascular congestion.  Linear atelectasis in the lung bases.  No pneumothorax.  No blunting of costophrenic angles.  IMPRESSION: Endotracheal tube placed with tip about 4.8 cm above the carina.  Original Report Authenticated By: Marlon Pel, M.D.   Dg Chest Port 1 View  10/18/2011  *RADIOLOGY REPORT*  Clinical Data: 61 year old male with hypertension and altered mental status.  PORTABLE CHEST - 1 VIEW  Comparison: None.  Findings: Upright AP view 1953 hours.  Low lung volumes.  Right greater than left streaky bibasilar opacity.  No pneumothorax, pulmonary edema or definite effusion. There is cardiomegaly.  Other  mediastinal contours are within normal limits.  Visualized tracheal air column is within normal limits.  IMPRESSION: Low lung volumes with right greater than left basilar opacity. Favor atelectasis but lung base infection cannot be excluded.  Original Report Authenticated By: Harley Hallmark, M.D.    Review of systems not obtained due to patient factors. Blood pressure 116/71, pulse 76, temperature 100 F (37.8 C), temperature source Oral, resp. rate 15, height 6\' 2"  (1.88 m), weight 108.7 kg (239 lb 10.2 oz), SpO2 97.00%. The patient is awake and alert and follows some commands. He has definite weakness of his left upper extremity does move his right upper extremity this time. He can wiggle his toes bilaterally it is hard to assess his weakness because U strap down to the gurney.  Assessment/Plan: Impression is that of spinal cord compression C4-5 and C5-6. The patient then reported to have marked weakness but now mostly has weakness of his left upper extremity. We discussed the situation with him he wants to proceed with surgery. We discussed the fact that there is no guarantee that he'll get any improvement with surgery but is unlikely that he will get improvement without it. The risks of surgery include bleeding infection weakness numbness paralysis spinal fluid leakage coma quadriplegia hoarseness and death. We have discussed alternative methods of therapy although risks and benefits of nonintervention. He appears to understand and request we proceed.  Reinaldo Meeker, MD 10/19/2011, 7:26 PM

## 2011-10-19 NOTE — Consult Note (Signed)
Date of Admission:  10/18/2011  Date of Consult:  10/19/2011  Reason for Consult: Meningitis, Fever, Sinusitis Referring Physician: Tyson Alias  Impression/Recommendation Mental Status Change Sinusitis Meningitis? Rhabdo Would- await CSF before changing anbx (unless MRI shows obvious other cause) Await MRI- could this be subarachnoid/subdural after his fall? Check HSV PCR RMSF IgM, Ehrlichia Comment- His CSF WBC and RBC are not entirely consistent with meningitis but his WBC # is too high to be attributed to RBC alone. Can have para-meningeal inflammation from severe sinusitis. He has no rash to make me think of tick borne infection ( he does have mild decrease in PLT, mild increase in LFTs), will send serologies.   ODYN TURKO is an 61 y.o. male.  HPI: 61 yo M with hx of HTN is brought to hospital 6-1 after falling at home. He became more confused and then febrile. In ED was found to have temp 101.2, WBC 12.9.  He required intubation for airway protection. CT scan of the head showed R maxillary sinusitis. He underwent LP (Glc 88, Prot 127, WBC 46/12, RBC 42/22).  He was started on amp/ceftriaxone/vanco.  He has continued to have fever in hospital     Past Medical History  Diagnosis Date  . Hypertension     History reviewed. No pertinent past surgical history.ergies:   No Known Allergies  Medications:  Scheduled:   . acyclovir  10 mg/kg (Adjusted) Intravenous Q8H  . ampicillin (OMNIPEN) IV  2 g Intravenous Q4H  . antiseptic oral rinse  15 mL Mouth Rinse QID  . cefTRIAXone (ROCEPHIN)  IV  2 g Intravenous Q12H  . chlorhexidine  15 mL Mouth Rinse BID  . dexamethasone  10 mg Intravenous Q6H  . etomidate      . feeding supplement (PROMOTE)  1,000 mL Per Tube Q24H  . fentaNYL      . heparin  5,000 Units Subcutaneous Q8H  . insulin aspart  0-15 Units Subcutaneous Q4H  . insulin glargine  5 Units Subcutaneous QHS  . lidocaine (cardiac) 100 mg/76ml      . naloxone (NARCAN)  injection  0.4 mg Intravenous Once  . pantoprazole sodium  40 mg Per Tube Q1200  . potassium chloride  10 mEq Intravenous Q1 Hr x 4  . potassium phosphate IVPB (mmol)  30 mmol Intravenous Once  . rocuronium      . sodium chloride  1,000 mL Intravenous Once  . sodium chloride  500 mL Intravenous Once  . succinylcholine      . vancomycin  1,250 mg Intravenous Q12H  . vancomycin  1,000 mg Intravenous Once  . vancomycin  1,000 mg Intravenous Once  . DISCONTD: dexamethasone  12 mg Intravenous Q4H  . DISCONTD: dexamethasone  12 mg Intravenous Q4H  . DISCONTD: piperacillin-tazobactam (ZOSYN)  IV  3.375 g Intravenous Once    Social History:  does not have a smoking history on file. He does not have any smokeless tobacco history on file. His alcohol and drug histories not on file.  History reviewed. No pertinent family history.  General ROS: pt on vent, unobtainable.  Blood pressure 141/91, pulse 80, temperature 100 F (37.8 C), temperature source Oral, resp. rate 27, height 6\' 2"  (1.88 m), weight 108.7 kg (239 lb 10.2 oz), SpO2 97.00%. General appearance: alert, cooperative and mild distress Eyes: negative findings: pupils equal, round, reactive to light and accomodation and no photophobia Neck: no adenopathy and . he does have limitation of movement, pain with flexion and rotation.  Lungs: rhonchi/vent sounds.  Heart: regular rate and rhythm Abdomen: normal findings: bowel sounds normal and soft, non-tender and abnormal findings:  distended Extremities: edema none and varicose veins noted Skin: no rashes or nail bed lesions Neurologic: Motor: normal grip RUE, normal plantar strength RLE. No movement  LUE.  minimal side to side movement LLE.    Results for orders placed during the hospital encounter of 10/18/11 (from the past 48 hour(s))  GLUCOSE, CAPILLARY     Status: Abnormal   Collection Time   10/18/11  6:31 PM      Component Value Range Comment   Glucose-Capillary 161 (*) 70 - 99  (mg/dL)   CBC     Status: Abnormal   Collection Time   10/18/11  7:10 PM      Component Value Range Comment   WBC 12.9 (*) 4.0 - 10.5 (K/uL)    RBC 5.23  4.22 - 5.81 (MIL/uL)    Hemoglobin 16.0  13.0 - 17.0 (g/dL)    HCT 14.7  82.9 - 56.2 (%)    MCV 83.7  78.0 - 100.0 (fL)    MCH 30.6  26.0 - 34.0 (pg)    MCHC 36.5 (*) 30.0 - 36.0 (g/dL)    RDW 13.0  86.5 - 78.4 (%)    Platelets 113 (*) 150 - 400 (K/uL)   DIFFERENTIAL     Status: Abnormal   Collection Time   10/18/11  7:10 PM      Component Value Range Comment   Neutrophils Relative 82 (*) 43 - 77 (%)    Lymphocytes Relative 5 (*) 12 - 46 (%)    Monocytes Relative 13 (*) 3 - 12 (%)    Eosinophils Relative 0  0 - 5 (%)    Basophils Relative 0  0 - 1 (%)    Neutro Abs 10.6 (*) 1.7 - 7.7 (K/uL)    Lymphs Abs 0.6 (*) 0.7 - 4.0 (K/uL)    Monocytes Absolute 1.7 (*) 0.1 - 1.0 (K/uL)    Eosinophils Absolute 0.0  0.0 - 0.7 (K/uL)    Basophils Absolute 0.0  0.0 - 0.1 (K/uL)    Smear Review PLATELET COUNT CONFIRMED BY SMEAR     COMPREHENSIVE METABOLIC PANEL     Status: Abnormal   Collection Time   10/18/11  7:10 PM      Component Value Range Comment   Sodium 123 (*) 135 - 145 (mEq/L)    Potassium 4.1  3.5 - 5.1 (mEq/L) SLIGHT HEMOLYSIS   Chloride 88 (*) 96 - 112 (mEq/L)    CO2 21  19 - 32 (mEq/L)    Glucose, Bld 163 (*) 70 - 99 (mg/dL)    BUN 28 (*) 6 - 23 (mg/dL)    Creatinine, Ser 6.96  0.50 - 1.35 (mg/dL)    Calcium 8.6  8.4 - 10.5 (mg/dL)    Total Protein 8.0  6.0 - 8.3 (g/dL)    Albumin 3.3 (*) 3.5 - 5.2 (g/dL)    AST 53 (*) 0 - 37 (U/L) SLIGHT HEMOLYSIS   ALT 33  0 - 53 (U/L)    Alkaline Phosphatase 75  39 - 117 (U/L)    Total Bilirubin 1.1  0.3 - 1.2 (mg/dL)    GFR calc non Af Amer >90  >90 (mL/min)    GFR calc Af Amer >90  >90 (mL/min)   CARDIAC PANEL(CRET KIN+CKTOT+MB+TROPI)     Status: Abnormal   Collection Time   10/18/11  7:10 PM  Component Value Range Comment   Total CK 1402 (*) 7 - 232 (U/L)    CK, MB 7.2 (*) 0.3  - 4.0 (ng/mL)    Troponin I <0.30  <0.30 (ng/mL)    Relative Index 0.5  0.0 - 2.5    ETHANOL     Status: Normal   Collection Time   10/18/11  7:10 PM      Component Value Range Comment   Alcohol, Ethyl (B) <11  0 - 11 (mg/dL)   URINALYSIS, ROUTINE W REFLEX MICROSCOPIC     Status: Abnormal   Collection Time   10/18/11  8:14 PM      Component Value Range Comment   Color, Urine AMBER (*) YELLOW  BIOCHEMICALS MAY BE AFFECTED BY COLOR   APPearance CLOUDY (*) CLEAR     Specific Gravity, Urine 1.032 (*) 1.005 - 1.030     pH 6.0  5.0 - 8.0     Glucose, UA NEGATIVE  NEGATIVE (mg/dL)    Hgb urine dipstick LARGE (*) NEGATIVE     Bilirubin Urine SMALL (*) NEGATIVE     Ketones, ur TRACE (*) NEGATIVE (mg/dL)    Protein, ur 119 (*) NEGATIVE (mg/dL)    Urobilinogen, UA 1.0  0.0 - 1.0 (mg/dL)    Nitrite NEGATIVE  NEGATIVE     Leukocytes, UA TRACE (*) NEGATIVE    URINE RAPID DRUG SCREEN (HOSP PERFORMED)     Status: Abnormal   Collection Time   10/18/11  8:14 PM      Component Value Range Comment   Opiates NONE DETECTED  NONE DETECTED     Cocaine NONE DETECTED  NONE DETECTED     Benzodiazepines POSITIVE (*) NONE DETECTED     Amphetamines NONE DETECTED  NONE DETECTED     Tetrahydrocannabinol NONE DETECTED  NONE DETECTED     Barbiturates NONE DETECTED  NONE DETECTED    URINE MICROSCOPIC-ADD ON     Status: Normal   Collection Time   10/18/11  8:14 PM      Component Value Range Comment   WBC, UA 0-2  <3 (WBC/hpf)    Urine-Other MUCOUS PRESENT     LACTIC ACID, PLASMA     Status: Abnormal   Collection Time   10/18/11  9:15 PM      Component Value Range Comment   Lactic Acid, Venous 2.9 (*) 0.5 - 2.2 (mmol/L)   PROCALCITONIN     Status: Normal   Collection Time   10/18/11  9:15 PM      Component Value Range Comment   Procalcitonin 2.09     BLOOD GAS, ARTERIAL     Status: Abnormal   Collection Time   10/18/11  9:55 PM      Component Value Range Comment   FIO2 1.00      Delivery systems VENTILATOR       Mode PRESSURE REGULATED VOLUME CONTROL      VT 0.450      Rate 26      Peep/cpap 5.0      pH, Arterial 7.441  7.350 - 7.450     pCO2 arterial 33.3 (*) 35.0 - 45.0 (mmHg)    pO2, Arterial 386.0 (*) 80.0 - 100.0 (mmHg)    Bicarbonate 22.3  20.0 - 24.0 (mEq/L)    TCO2 19.5  0 - 100 (mmol/L)    Acid-base deficit 0.7  0.0 - 2.0 (mmol/L)    O2 Saturation 99.6      Patient temperature 98.6  Collection site RIGHT RADIAL      Drawn by [phone]      Sample type ARTERIAL DRAW      Allens test (pass/fail) PASS  PASS    PROTEIN AND GLUCOSE, CSF     Status: Abnormal   Collection Time   10/18/11 11:45 PM      Component Value Range Comment   Glucose, CSF 88 (*) 43 - 76 (mg/dL)    Total  Protein, CSF 127 (*) 15 - 45 (mg/dL)   GRAM STAIN     Status: Normal   Collection Time   10/18/11 11:45 PM      Component Value Range Comment   Specimen Description CSF      Special Requests Normal      Gram Stain        Value: WBC PRESENT,BOTH PMN AND MONONUCLEAR     NO ORGANISMS SEEN     CYTO SPUN SLIDE     Gram Stain Report Called to,Read Back By and Verified With: TWOODS RN AT 0141 ON [id number] BY DLONG   Report Status 10/19/2011 FINAL     CSF CELL COUNT WITH DIFFERENTIAL     Status: Abnormal   Collection Time   10/18/11 11:45 PM      Component Value Range Comment   Tube # 1      Color, CSF COLORLESS  COLORLESS     Appearance, CSF CLEAR  CLEAR     Supernatant NOT INDICATED      RBC Count, CSF 22 (*) 0 (/cu mm)    WBC, CSF 12 (*) 0 - 5 (/cu mm)    Segmented Neutrophils-CSF 68 (*) 0 - 6 (%)    Lymphs, CSF 16 (*) 40 - 80 (%)    Monocyte-Macrophage-Spinal Fluid 16  15 - 45 (%)   CSF CELL COUNT WITH DIFFERENTIAL     Status: Abnormal   Collection Time   10/18/11 11:45 PM      Component Value Range Comment   Tube # 4      Color, CSF COLORLESS  COLORLESS     Appearance, CSF CLEAR  CLEAR     Supernatant NOT INDICATED      RBC Count, CSF 42 (*) 0 (/cu mm)    WBC, CSF 46 (*) 0 - 5 (/cu mm)    Segmented  Neutrophils-CSF 86 (*) 0 - 6 (%)    Lymphs, CSF 5 (*) 40 - 80 (%)    Monocyte-Macrophage-Spinal Fluid 9 (*) 15 - 45 (%)    Other Cells, CSF RARE NRBCs     MRSA PCR SCREENING     Status: Normal   Collection Time   10/19/11 12:23 AM      Component Value Range Comment   MRSA by PCR NEGATIVE  NEGATIVE    CBC     Status: Abnormal   Collection Time   10/19/11  3:33 AM      Component Value Range Comment   WBC 11.1 (*) 4.0 - 10.5 (K/uL)    RBC 4.39  4.22 - 5.81 (MIL/uL)    Hemoglobin 13.0  13.0 - 17.0 (g/dL)    HCT 14.7 (*) 82.9 - 52.0 (%)    MCV 84.3  78.0 - 100.0 (fL)    MCH 29.6  26.0 - 34.0 (pg)    MCHC 35.1  30.0 - 36.0 (g/dL)    RDW 56.2  13.0 - 86.5 (%)    Platelets 95 (*) 150 - 400 (K/uL)   BASIC  METABOLIC PANEL     Status: Abnormal   Collection Time   10/19/11  3:33 AM      Component Value Range Comment   Sodium 127 (*) 135 - 145 (mEq/L)    Potassium 3.0 (*) 3.5 - 5.1 (mEq/L) DELTA CHECK NOTED   Chloride 94 (*) 96 - 112 (mEq/L)    CO2 23  19 - 32 (mEq/L)    Glucose, Bld 245 (*) 70 - 99 (mg/dL)    BUN 24 (*) 6 - 23 (mg/dL)    Creatinine, Ser 0.98  0.50 - 1.35 (mg/dL)    Calcium 7.5 (*) 8.4 - 10.5 (mg/dL)    GFR calc non Af Amer >90  >90 (mL/min)    GFR calc Af Amer >90  >90 (mL/min)   MAGNESIUM     Status: Normal   Collection Time   10/19/11  3:33 AM      Component Value Range Comment   Magnesium 2.0  1.5 - 2.5 (mg/dL)   PHOSPHORUS     Status: Abnormal   Collection Time   10/19/11  3:33 AM      Component Value Range Comment   Phosphorus 1.3 (*) 2.3 - 4.6 (mg/dL)   BLOOD GAS, ARTERIAL     Status: Abnormal   Collection Time   10/19/11  5:14 AM      Component Value Range Comment   FIO2 0.40      Delivery systems VENTILATOR      Mode PRESSURE REGULATED VOLUME CONTROL      VT 450      Rate 26      Peep/cpap 5.0      pH, Arterial 7.512 (*) 7.350 - 7.450     pCO2 arterial 29.0 (*) 35.0 - 45.0 (mmHg)    pO2, Arterial 107.0 (*) 80.0 - 100.0 (mmHg)    Bicarbonate 23.0  20.0 -  24.0 (mEq/L)    TCO2 20.1  0 - 100 (mmol/L)    Acid-Base Excess 1.3  0.0 - 2.0 (mmol/L)    O2 Saturation 98.6      Patient temperature 98.6      Collection site RIGHT RADIAL      Drawn by [phone]      Sample type ARTERIAL DRAW      Allens test (pass/fail) PASS  PASS    GLUCOSE, CAPILLARY     Status: Abnormal   Collection Time   10/19/11  8:42 AM      Component Value Range Comment   Glucose-Capillary 248 (*) 70 - 99 (mg/dL)    Comment 1 Documented in Chart      Comment 2 Notify RN     BLOOD GAS, ARTERIAL     Status: Abnormal   Collection Time   10/19/11  9:52 AM      Component Value Range Comment   FIO2 0.35      Delivery systems VENTILATOR      Mode PRESSURE REGULATED VOLUME CONTROL      VT 660      Rate 14      Peep/cpap 5.0      pH, Arterial 7.523 (*) 7.350 - 7.450     pCO2 arterial 25.5 (*) 35.0 - 45.0 (mmHg)    pO2, Arterial 81.3  80.0 - 100.0 (mmHg)    Bicarbonate 20.9  20.0 - 24.0 (mEq/L)    TCO2 18.1  0 - 100 (mmol/L)    Acid-base deficit 0.3  0.0 - 2.0 (mmol/L)    O2 Saturation  97.2      Patient temperature 98.6      Collection site RIGHT RADIAL      Drawn by [phone]      Sample type ARTERIAL DRAW     CBC     Status: Abnormal   Collection Time   10/19/11 10:06 AM      Component Value Range Comment   WBC 12.0 (*) 4.0 - 10.5 (K/uL)    RBC 4.39  4.22 - 5.81 (MIL/uL)    Hemoglobin 13.1  13.0 - 17.0 (g/dL)    HCT 04.5 (*) 40.9 - 52.0 (%)    MCV 83.6  78.0 - 100.0 (fL)    MCH 29.8  26.0 - 34.0 (pg)    MCHC 35.7  30.0 - 36.0 (g/dL)    RDW 81.1  91.4 - 78.2 (%)    Platelets 100 (*) 150 - 400 (K/uL) CONSISTENT WITH PREVIOUS RESULT  DIFFERENTIAL     Status: Abnormal   Collection Time   10/19/11 10:06 AM      Component Value Range Comment   Neutrophils Relative 92 (*) 43 - 77 (%)    Neutro Abs 11.1 (*) 1.7 - 7.7 (K/uL)    Lymphocytes Relative 4 (*) 12 - 46 (%)    Lymphs Abs 0.5 (*) 0.7 - 4.0 (K/uL)    Monocytes Relative 4  3 - 12 (%)    Monocytes Absolute 0.4  0.1 - 1.0  (K/uL)    Eosinophils Relative 0  0 - 5 (%)    Eosinophils Absolute 0.0  0.0 - 0.7 (K/uL)    Basophils Relative 0  0 - 1 (%)    Basophils Absolute 0.0  0.0 - 0.1 (K/uL)   GLUCOSE, CAPILLARY     Status: Abnormal   Collection Time   10/19/11 11:58 AM      Component Value Range Comment   Glucose-Capillary 196 (*) 70 - 99 (mg/dL)    Comment 1 Notify RN      Comment 2 Documented in Chart         Component Value Date/Time   SDES CSF 10/18/2011 2345   SPECREQUEST Normal 10/18/2011 2345   REPTSTATUS 10/19/2011 FINAL 10/18/2011 2345   Ct Head Wo Contrast  10/18/2011  *RADIOLOGY REPORT*  Clinical Data: 61 year old male found down.  Weakness.  CT HEAD WITHOUT CONTRAST  Technique:  Contiguous axial images were obtained from the base of the skull through the vertex without contrast.  Comparison: None.  Findings: Mixed layering and bubbly opacity in the right maxillary sinus.  Other Visualized paranasal sinuses and mastoids are clear. No acute osseous abnormality identified.  Visualized orbits and scalp soft tissues are within normal limits.  Calcified atherosclerosis at the skull base.  No suspicious intracranial vascular hyperdensity.  No ventriculomegaly. No midline shift, mass effect, or evidence of mass lesion.  No acute intracranial hemorrhage identified.  No evidence of cortically based acute infarction identified.  IMPRESSION: 1.  Normal for age noncontrast CT appearance of the brain. 2.  Right maxillary sinusitis.  Original Report Authenticated By: Harley Hallmark, M.D.   Dg Chest Port 1 View  10/18/2011  *RADIOLOGY REPORT*  Clinical Data: Evaluate endotracheal tube.  Hypertension.  PORTABLE CHEST - 1 VIEW  Comparison: 10/18/2011 at the 1953 hours  Findings: Interval placement of endotracheal tube with tip about 4.8 cm above the carina.  An enteric tube has been placed.  The tip is not visualized but is below the left hemidiaphragm consistent with location at  least in the stomach.  Shallow inspiration.  Mild  cardiac enlargement with suggestion of mild pulmonary vascular congestion.  Linear atelectasis in the lung bases.  No pneumothorax.  No blunting of costophrenic angles.  IMPRESSION: Endotracheal tube placed with tip about 4.8 cm above the carina.  Original Report Authenticated By: Marlon Pel, M.D.   Dg Chest Port 1 View  10/18/2011  *RADIOLOGY REPORT*  Clinical Data: 61 year old male with hypertension and altered mental status.  PORTABLE CHEST - 1 VIEW  Comparison: None.  Findings: Upright AP view 1953 hours.  Low lung volumes.  Right greater than left streaky bibasilar opacity.  No pneumothorax, pulmonary edema or definite effusion. There is cardiomegaly.  Other mediastinal contours are within normal limits.  Visualized tracheal air column is within normal limits.  IMPRESSION: Low lung volumes with right greater than left basilar opacity. Favor atelectasis but lung base infection cannot be excluded.  Original Report Authenticated By: Harley Hallmark, M.D.    Thank you so much for this interesting consult,   Johny Sax [phone] 10/19/2011, 1:18 PM     LOS: 1 day

## 2011-10-19 NOTE — Progress Notes (Signed)
INITIAL ADULT NUTRITION ASSESSMENT Date: 10/19/2011   Time: 10:03 AM Reason for Assessment: TF Consult, Vent, Nutrition Risk  ASSESSMENT: Male 61 y.o.  Dx: vent dependent respiratory failure, hypokalemia, hypophosphatemia, presumed meningitis, hyperglycemia, AMS  Hx:  Past Medical History  Diagnosis Date  . Hypertension    History reviewed. No pertinent past surgical history.  Related Meds: biotene, decadron, novolog, lantus, protonix, KCl, heparin, rocephin, vancocyn, Propofol increased to 19.6 ml/hr now.   Ht: 6\' 2"  (188 cm)  Wt: 239 lb 10.2 oz (108.7 kg)    Ideal Wt: 86kg % Ideal Wt: 126  Usual Wt:  Wt Readings from Last 10 Encounters:  10/18/11 239 lb 10.2 oz (108.7 kg)  07/14/06 210 lb (95.255 kg)    % Usual Wt: 100   Body mass index is 30.77 kg/(m^2).  Food/Nutrition Related Hx: Spoke with girlfriend.  Regular diet, ate well prior to admit.  Weight stable.  Occasional problems chewing/swallowing at times per nutrition risk report.  Labs:  CMP     Component Value Date/Time   NA 127* 10/19/2011 0333   K 3.0* 10/19/2011 0333   CL 94* 10/19/2011 0333   CO2 23 10/19/2011 0333   GLUCOSE 245* 10/19/2011 0333   BUN 24* 10/19/2011 0333   CREATININE 0.75 10/19/2011 0333   CALCIUM 7.5* 10/19/2011 0333   PROT 8.0 10/18/2011 1910   ALBUMIN 3.3* 10/18/2011 1910   AST 53* 10/18/2011 1910   ALT 33 10/18/2011 1910   ALKPHOS 75 10/18/2011 1910   BILITOT 1.1 10/18/2011 1910   GFRNONAA >90 10/19/2011 0333   GFRAA >90 10/19/2011 0333    I/O last 3 completed shifts: In: 2292.5 [I.V.:1213.9; IV Piggyback:1078.6] Out: 810 [Urine:810] Total I/O In: 155.4 [I.V.:155.4] Out: -   ' Diet Order: NPO  Supplements/Tube Feeding:   Osmolite 1.2 ordered and not yet started.  For MRI at 12:30 today.  IVF:     sodium chloride Last Rate: 125 mL/hr at 10/18/11 2026  feeding supplement (OSMOLITE 1.2 CAL)   fentaNYL infusion INTRAVENOUS Last Rate: 50 mcg/hr (10/19/11 0900)  propofol Last Rate: 20 mcg/kg/min  (10/19/11 0900)    Estimated Nutritional Needs:  Based on ASPEN guidelines for permissive underfeeding (60% of estimated needs)   Kcal: 1550-1700 Protein: 120-130g Fluid: >2.5L  Pt well nourished on admit.  Currently pt unable to grip with hands.  Larey Seat out of bed, high fever, for MRI today.  Sodium low and Glucose is elevated.  Propofol for sedation providing 517 kcal per day at current rate of 19.6 ml/hr.  NUTRITION DIAGNOSIS: -Inadequate oral intake (NI-2.1).  Status: Ongoing  RELATED TO: vent  AS EVIDENCE BY: npo status  MONITORING/EVALUATION(Goals): Monitor:  TF tolerance, diagnosis, propofol amounts, TF requirements, labs, I/O, ability to advance diet off vent and chewing/swallowing off vent.  EDUCATION NEEDS: -No education needs identified at this time  INTERVENTION:  Begin TF after MRI today.  Will change Osmolite 1.2 to Promote to better meet protein needs while on Propofol.  Dependent on I/O and sodium level, May need to change to a more concentrated formula off Propofol.    Begin Promote at 20 ml/hr increase 10 ml every 4 hours to goal of 45 ml/hr.  Add Prostat 30 ml bid 6/3 if tolerates TF overnight.  Promote at Goal of 45 ml per hour plus bid prostat and propofol to provide 1741 kcal, 97 g protein, 907 ml free water.  Dietitian [phone]  DOCUMENTATION CODES Per approved criteria  -Obesity Grade 1  Jeoffrey Massed 10/19/2011, 10:03 AM

## 2011-10-19 NOTE — Progress Notes (Addendum)
eLink Physician-Brief Progress Note Patient Name: Charles Ball DOB: [birth year] MRN: [id number]  Date of Service  10/19/2011   HPI/Events of Note  Body mass index is 30.77 kg/(m^2).  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

## 2011-10-19 NOTE — Op Note (Signed)
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 Localizing fluoroscopy was used prior to incision to verify the appropriate level. Transverse incision was made in the right anterior and  At the midline and headed towards the medial aspect of the sternocleidomastoid muscle. The platysma muscle was then incised transversely. Natural fascial plane between the strap muscles medially and the sternal cremaster laterally was identified and followed down to the anterior aspect the cervical spine. Longus Cole muscles were identified split in the midline to play bilaterally with Barista and unipolar coagulation. Self-retaining tract was placed for exposure and x-ray showed approach the appropriate level. Using a 15 blade the annulus of the disc at C4-5 and C5-6 was incised. Disc at C4-5 was found to be markedly disrupted and cultures were taken superficially and a piece of the disc sent for culture as well. The disc at C5-6 was also incised. Using the high-speed drill both spaces were widened and the anterior 90% of the disc material removed at both levels. The microscope was then draped brought into the field and used for the remainder of the case. Starting at C4-5 the remainder of the disc material down the posterior longitudinal ligament was removed. Ligament was incised and some unusual-looking fluid consistent with possible epidural abscess was encountered. Additional cultures were taken at this time. Thorough decompression was carried out on the spinal dura and to the proximal foramen  bilaterally and the vertebral bodies of C4 and C5 were undermined as well. At this time inspection was carried out for any evidence of residual compression at this level and none could be identified. Attention was then turned to C5-6 were decompression was carried out. Once again the posterior longitudinal ligament was incised and for decompression carried out on the spinal dura into the proximal foramen bilaterally. At this time inspection was carried out once more at both levels for any evidence of residual compression and none could be identified. Large amounts of irrigation were carried out and any bleeding controlled with upper coagulation and Gelfoam. Measurements were taken and to bone bank spacers were chosen. Both were lordotic graft were the 6 mm graft and the other one a 5 mm graft. 5 mm graft was impacted C5-6 and a 6 mm graft at C4-5. An appropriately length invisia plate was chosen and screw holes were placed followed by placing of the 14 mm screws x6. The mechanism was rotated to locked position and final fluoroscopy showed good positioning of the spacers plate and screws. Irrigation was carried out and any bleeding control proper coagulation. The was then closed with inverted Vicryl on the platysma muscle inverted 5-0 PDS in the subcuticular layer. A sterile dressing a soft collar applied and the patient was taken to the intensive care unit in stable condition.

## 2011-10-19 NOTE — Progress Notes (Signed)
Name: Charles Ball MRN: [id number] DOB: 1950-12-03    LOS: 1  PULMONARY / CRITICAL CARE MEDICINE FOLLOW UP NOTE  HPI: 61 year old male admitted initially to [facility] for presumed meningitis. He was found down with decreased movement of all four extremities. An MRI of the cervical spine showed C4-C5 and C5-C6 herniation and was transferred emergently to [facility] for surgical decompression. I am evaluating this patient on the immediate post-op at arrival to the NSICU. Of note his blood cultures grew GPC 4/4. At the time of my examination the patient is sedated with propofol and fentanyl and on mechanical ventilation.  Past Medical History  Diagnosis Date  . Hypertension    History reviewed. No pertinent past surgical history. Prior to Admission medications   Medication Sig Start Date End Date Taking? Authorizing Provider  hydrochlorothiazide (HYDRODIURIL) 25 MG tablet Take 25 mg by mouth daily.   Yes Historical Provider, MD   Allergies No Known Allergies  Family History History reviewed. No pertinent family history. Social History  does not have a smoking history on file. He does not have any smokeless tobacco history on file. His alcohol and drug histories not on file.  Review Of Systems:  Unable to provide.   Current Status: Intubated and sedated, on mechanical ventilation.  Vital Signs: Temp:  [99.1 F (37.3 C)-101.7 F (38.7 C)] 100 F (37.8 C) (06/02 1200) Pulse Rate:  [69-96] 76  (06/02 1800) Resp:  [15-28] 15  (06/02 1800) BP: (106-155)/(58-104) 116/71 mmHg (06/02 1800) SpO2:  [94 %-100 %] 97 % (06/02 1800) FiO2 (%):  [34.9 %-100 %] 40 % (06/02 2235) Weight:  [239 lb 10.2 oz (108.7 kg)] 239 lb 10.2 oz (108.7 kg) (06/01 2317)  Physical Examination: General:  Intubated, mechanically ventilated, no acute distress Neuro:  Sedated, triggering the vent, synchronous. Partially open eyes on repetitive voice command. Can barely move his RUE. No movement on painful stimuli on  LUE and both lower extremities. HEENT:  PERRL, pink conjunctivae, moist membranes Neck:  Supple, no JVD. Anterior access for cervical decompression surgery.    Cardiovascular:  RRR, no M/R/G Lungs:  Adequate air entry bilaterally, no W/R/R Abdomen:  Soft, nontender, nondistended, bowel sounds present Musculoskeletal:  Moves all extremities, no pedal edema Skin:  Superficial abdominal pressure ulcer covered    ASSESSMENT AND PLAN  PULMONARY  Lab 10/19/11 0952 10/19/11 0514 10/18/11 2155  PHART 7.523* 7.512* 7.441  PCO2ART 25.5* 29.0* 33.3*  PO2ART 81.3 107.0* 386.0*  HCO3 20.9 23.0 22.3  O2SAT 97.2 98.6 99.6   Ventilator Settings: Vent Mode:  [-] PRVC FiO2 (%):  [34.9 %-100 %] 40 % Set Rate:  [14 bmp-26 bmp] 14 bmp Vt Set:  [450 mL-660 mL] 600 mL PEEP:  [4.5 cmH20-5.2 cmH20] 5 cmH20 Plateau Pressure:  [14 cmH20-18 cmH20] 17 cmH20  ETT:  Adequate position.  A:  Respiratory failure, on mechanical ventilation for airway protection. P:   - Continue PRVC, VT 8cc/kg, Decrease FiO2 to 40%, RR14, PEEP: 5 - Will consider SBT in am.  CARDIOVASCULAR  Lab 10/18/11 2115 10/18/11 1910  TROPONINI -- <0.30  LATICACIDVEN 2.9* --  PROBNP -- --   ECG:  Normal sinus rhythm Lines: Peripheral lines  A: No issues, hemodynamically stable P:  Continue ICU monitoring.  RENAL  Lab 10/19/11 0333 10/18/11 1910  NA 127* 123*  K 3.0* 4.1  CL 94* 88*  CO2 23 21  BUN 24* 28*  CREATININE 0.75 0.73  CALCIUM 7.5* 8.6  MG  2.0 --  PHOS 1.3* --   Intake/Output      06/02 0701 - 06/03 0700   I.V. (mL/kg) 2682.5 (24.7)   IV Piggyback 869   Total Intake(mL/kg) 3551.5 (32.7)   Urine (mL/kg/hr) 1560 (0.9)   Blood 50   Total Output 1610   Net +1941.5        Foley:  6/1  A:  Hyponatremia, hypophosphatemia and hypokalemia on last labs. P:   Will order STAT CMP and replenish as needed.  GASTROINTESTINAL  Lab 10/18/11 1910  AST 53*  ALT 33  ALKPHOS 75  BILITOT 1.1  PROT 8.0    ALBUMIN 3.3*    A:  No issues P:   - Will contiue TF - PPI prophylaxis   HEMATOLOGIC  Lab 10/19/11 1006 10/19/11 0333 10/18/11 1910  HGB 13.1 13.0 16.0  HCT 36.7* 37.0* 43.8  PLT 100* 95* 113*  INR -- -- --  APTT -- -- --   A:  Thrombocytopenia stable P:  - Will continue DVT prophylaxis with sub q heparin - Intermittent compression  INFECTIOUS  Lab 10/19/11 1006 10/19/11 0333 10/18/11 2115 10/18/11 1910  WBC 12.0* 11.1* -- 12.9*  PROCALCITON -- -- 2.09 --   Cultures: Blood cultures 6/1: GPC's 4/4 Urine 6/1: CSF 6/1: Antibiotics: Vanc 6/1 >>>  Ceftiraxone 6/1 >>>  Ampicillin 6/1 >>>   A:   GPC bacteremia Acute sinusitis Questionable meningitis  P:   ID recommendations appreciated Will continue current antibiotics and acyclovir  ENDOCRINE  Lab 10/19/11 1817 10/19/11 1158 10/19/11 0842 10/18/11 1831  GLUCAP 194* 196* 248* 161*   A:  Hyperglycemia P:   Continue SSI and lantus with TF start.  NEUROLOGIC  A:  C4-C5, C5-C6 compression. Slight movement on RUE, No movement on LUE and both lower extremities. Open eyes on command. P:   -Post surgical decompression. - Continue Sedation and analgesia with propofol and fentanyl. - Daily awakening in am.  BEST PRACTICE / DISPOSITION - Level of Care:  ICU - Primary Service:  PCCM - Consultants:  ID and neurosurgery - Code Status:  Full - Diet:  Will start TF - DVT Px:  Heparin sub q - GI Px:  Protonix - Skin Integrity:  Abdominal pressure ulcer present at admission. Clean and covered.  - Social / Family:  No family at bedside at the time of my exam.    Overton Mam, M.D. Pulmonary and Critical Care Medicine [facility] Pager: [phone]  10/19/2011, 11:13 PM

## 2011-10-19 NOTE — Progress Notes (Signed)
eLink Physician-Brief Progress Note Patient Name: JAYLENN ALTIER DOB: 1951-01-20 MRN: [id number]  Date of Service  10/19/2011   HPI/Events of Note  Hypokalemia and hypophosphatemia   eICU Interventions  Potassium and phos replaced   Intervention Category Intermediate Interventions: Electrolyte abnormality - evaluation and management  Zaliah Wissner 10/19/2011, 4:22 AM

## 2011-10-19 NOTE — Transfer of Care (Signed)
Immediate Anesthesia Transfer of Care Note  Patient: Charles Ball  Procedure(s) Performed: Procedure(s) (LRB): ANTERIOR CERVICAL DECOMPRESSION/DISCECTOMY FUSION 2 LEVELS (N/A)  Patient Location: PACU and NICU  Anesthesia Type: General  Level of Consciousness: sedated  Airway & Oxygen Therapy: Patient remains intubated per anesthesia plan and Patient placed on Ventilator (see vital sign flow sheet for setting)  Post-op Assessment: Report given to PACU RN and Post -op Vital signs reviewed and stable  Post vital signs: Reviewed and stable  Complications: No apparent anesthesia complications

## 2011-10-19 NOTE — Progress Notes (Signed)
Darryl from [employer] called, all 4 bottles blood cx gram positive cocci in clusters.  Dr. Marchelle Gearing notified by phone.  Patient transferring to [facility] for neurosurgery.  [facility] here to transport patient.  Will continue to monitor.

## 2011-10-19 NOTE — Plan of Care (Signed)
Problem: Phase I Progression Outcomes Goal: HOB elevated 30 degrees Outcome: Completed/Met Date Met:  10/19/11 HOB <20 degrees 1 hour post LP.

## 2011-10-19 NOTE — Anesthesia Preprocedure Evaluation (Signed)
Anesthesia Evaluation  Patient identified by MRN, date of birth, ID band Patient awake    Reviewed: Allergy & Precautions, H&P , NPO status , Patient's Chart, lab work & pertinent test results, Unable to perform ROS - Chart review only  History of Anesthesia Complications Negative for: history of anesthetic complications  Airway       Dental   Pulmonary Current Smoker,  Intubated for airway protection   breath sounds clear to auscultation  Pulmonary exam normal       Cardiovascular hypertension, Pt. on medications Rhythm:Regular Rate:Normal     Neuro/Psych  Headaches,    GI/Hepatic negative GI ROS, Neg liver ROS,   Endo/Other  negative endocrine ROS  Renal/GU negative Renal ROS     Musculoskeletal   Abdominal (+) + obese,   Peds  Hematology plts 100K   Anesthesia Other Findings Intubated and ventilated  Reproductive/Obstetrics                           Anesthesia Physical Anesthesia Plan  ASA: III and Emergent  Anesthesia Plan: General   Post-op Pain Management:    Induction: Intravenous  Airway Management Planned: Oral ETT  Additional Equipment:   Intra-op Plan:   Post-operative Plan: Post-operative intubation/ventilation  Informed Consent: I have reviewed the patients History and Physical, chart, labs and discussed the procedure including the risks, benefits and alternatives for the proposed anesthesia with the patient or authorized representative who has indicated his/her understanding and acceptance.   History available from chart only and Only emergency history available  Plan Discussed with: CRNA and Surgeon  Anesthesia Plan Comments: (Plan routine monitors, GETA via existing ETT)        Anesthesia Quick Evaluation

## 2011-10-19 NOTE — Brief Op Note (Signed)
10/18/2011 - 10/19/2011  9:57 PM  PATIENT:  Charles Ball  61 y.o. male  PRE-OPERATIVE DIAGNOSIS:  Cervical four-five, five-six Spinal Cord Compression  POST-OPERATIVE DIAGNOSIS:  Cervical four-five, five-six Spinal Cord Compression  PROCEDURE:  Procedure(s) (LRB): ANTERIOR CERVICAL DECOMPRESSION/DISCECTOMY FUSION 2 LEVELS (N/A)  SURGEON:  Surgeon(s) and Role:    * Reinaldo Meeker, MD - Primary  PHYSICIAN ASSISTANT:   ASSISTANTS: none   ANESTHESIA:   general  EBL:  Total I/O In: 1600 [I.V.:1600] Out: 460 [Urine:410; Blood:50]  BLOOD ADMINISTERED:none  DRAINS: none   LOCAL MEDICATIONS USED:  NONE  SPECIMEN:  Source of Specimen:  Disc material  DISPOSITION OF SPECIMEN:  Micro lab  COUNTS:  YES  TOURNIQUET:  * No tourniquets in log *  DICTATION: .Dragon Dictation  PLAN OF CARE: Admit to inpatient   PATIENT DISPOSITION:  PACU - hemodynamically stable.   Delay start of Pharmacological VTE agent (>24hrs) due to surgical blood loss or risk of bleeding: yes

## 2011-10-20 ENCOUNTER — Inpatient Hospital Stay (HOSPITAL_COMMUNITY): Payer: Medicaid Other

## 2011-10-20 DIAGNOSIS — G039 Meningitis, unspecified: Secondary | ICD-10-CM

## 2011-10-20 DIAGNOSIS — R509 Fever, unspecified: Secondary | ICD-10-CM

## 2011-10-20 DIAGNOSIS — J329 Chronic sinusitis, unspecified: Secondary | ICD-10-CM

## 2011-10-20 LAB — BLOOD GAS, ARTERIAL
Bicarbonate: 24.4 mEq/L — ABNORMAL HIGH (ref 20.0–24.0)
Drawn by: 347641
MECHVT: 600 mL
O2 Saturation: 98.1 %
PEEP: 5 cmH2O
Patient temperature: 98.6
pH, Arterial: 7.436 (ref 7.350–7.450)

## 2011-10-20 LAB — CBC
HCT: 35.4 % — ABNORMAL LOW (ref 39.0–52.0)
Hemoglobin: 12.4 g/dL — ABNORMAL LOW (ref 13.0–17.0)
MCHC: 35 g/dL (ref 30.0–36.0)
RBC: 4.22 MIL/uL (ref 4.22–5.81)

## 2011-10-20 LAB — EHRLICHIA ANTIBODY PANEL
E chaffeensis (HGE) Ab, IgG: NEGATIVE
E chaffeensis (HGE) Ab, IgM: NEGATIVE

## 2011-10-20 LAB — HERPES SIMPLEX VIRUS(HSV) DNA BY PCR
HSV 1 DNA: NOT DETECTED
HSV 2 DNA: NOT DETECTED

## 2011-10-20 LAB — COMPREHENSIVE METABOLIC PANEL
ALT: 28 U/L (ref 0–53)
AST: 47 U/L — ABNORMAL HIGH (ref 0–37)
Albumin: 2.1 g/dL — ABNORMAL LOW (ref 3.5–5.2)
CO2: 22 mEq/L (ref 19–32)
Calcium: 7.5 mg/dL — ABNORMAL LOW (ref 8.4–10.5)
Chloride: 104 mEq/L (ref 96–112)
GFR calc non Af Amer: >90 mL/min (ref >90)
Sodium: 136 mEq/L (ref 135–145)
Total Bilirubin: 0.4 mg/dL (ref 0.3–1.2)

## 2011-10-20 LAB — MAGNESIUM: Magnesium: 2.4 mg/dL (ref 1.5–2.5)

## 2011-10-20 LAB — URINE CULTURE
Colony Count: NO GROWTH
Culture: NO GROWTH

## 2011-10-20 LAB — GLUCOSE, CAPILLARY
Glucose-Capillary: 160 mg/dL — ABNORMAL HIGH (ref 70–99)
Glucose-Capillary: 200 mg/dL — ABNORMAL HIGH (ref 70–99)
Glucose-Capillary: 209 mg/dL — ABNORMAL HIGH (ref 70–99)
Glucose-Capillary: 188 mg/dL — ABNORMAL HIGH (ref 70–99)

## 2011-10-20 MED ORDER — FENTANYL CITRATE 0.05 MG/ML IJ SOLN
INTRAMUSCULAR | Status: AC
  Filled 2011-10-20: qty 2

## 2011-10-20 MED ORDER — OXYCODONE HCL 5 MG PO TABS
30.0000 mg | ORAL_TABLET | ORAL | Status: DC | PRN
  Administered 2011-10-20 – 2011-10-26 (×15): 30 mg via ORAL
  Filled 2011-10-20 (×16): qty 6

## 2011-10-20 MED ORDER — ALPRAZOLAM 0.5 MG PO TABS
1.0000 mg | ORAL_TABLET | Freq: Three times a day (TID) | ORAL | Status: DC
  Administered 2011-10-20 – 2011-10-23 (×9): 1 mg via ORAL
  Filled 2011-10-20 (×2): qty 2
  Filled 2011-10-20 (×4): qty 1
  Filled 2011-10-20 (×3): qty 2
  Filled 2011-10-20: qty 1
  Filled 2011-10-20: qty 2
  Filled 2011-10-20: qty 1

## 2011-10-20 MED ORDER — PHENOL 1.4 % MT LIQD: 1.0000 | OROMUCOSAL | Status: DC | PRN

## 2011-10-20 MED ORDER — FENTANYL CITRATE 0.05 MG/ML IJ SOLN
25.0000 ug | INTRAMUSCULAR | Status: DC | PRN
  Administered 2011-10-20 (×2): 50 ug via INTRAVENOUS
  Filled 2011-10-20: qty 2

## 2011-10-20 MED ORDER — FENTANYL CITRATE 0.05 MG/ML IJ SOLN
50.0000 ug | INTRAMUSCULAR | Status: DC | PRN
  Administered 2011-10-20 – 2011-10-21 (×10): 75 ug via INTRAVENOUS
  Filled 2011-10-20 (×10): qty 2

## 2011-10-20 MED ORDER — MENTHOL 3 MG MT LOZG: 1.0000 | LOZENGE | OROMUCOSAL | Status: DC | PRN

## 2011-10-20 NOTE — Progress Notes (Signed)
Wasted in sink 245cc fentanyl and 60cc propofol. Witnessed by Cephas Darby RN.

## 2011-10-20 NOTE — Progress Notes (Signed)
INFECTIOUS DISEASE PROGRESS NOTE  ID: Charles Ball is a 61 y.o. male with   Active Problems:  Acute respiratory failure  Cervical (neck) region somatic dysfunction  Subjective: Wants collar off his neck  Abtx:  Anti-infectives     Start     Dose/Rate Route Frequency Ordered Stop   10/19/11 2007   bacitracin 16109 UNITS injection     Comments: [name], [name]: cabinet override         10/19/11 2007 10/20/11 0814   10/19/11 2005   bacitracin 50,000 Units in sodium chloride irrigation 0.9 % 500 mL irrigation  Status:  Discontinued          As needed 10/19/11 2058 10/19/11 2209   10/19/11 1200   vancomycin (VANCOCIN) 1,250 mg in sodium chloride 0.9 % 250 mL IVPB        1,250 mg 166.7 mL/hr over 90 Minutes Intravenous Every 12 hours 10/18/11 2157     10/19/11 0200   acyclovir (ZOVIRAX) 930 mg in dextrose 5 % 150 mL IVPB        10 mg/kg  92.8 kg (Adjusted) 168.6 mL/hr over 60 Minutes Intravenous 3 times per day 10/19/11 0156     10/18/11 2230   ampicillin (OMNIPEN) 2 g in sodium chloride 0.9 % 50 mL IVPB        2 g 150 mL/hr over 20 Minutes Intravenous Every 4 hours 10/18/11 2130     10/18/11 2200   cefTRIAXone (ROCEPHIN) 2 g in dextrose 5 % 50 mL IVPB        2 g 100 mL/hr over 30 Minutes Intravenous Every 12 hours 10/18/11 2130     10/18/11 2200   vancomycin (VANCOCIN) IVPB 1000 mg/200 mL premix        1,000 mg 200 mL/hr over 60 Minutes Intravenous  Once 10/18/11 2156 10/19/11 0258   10/18/11 2045   piperacillin-tazobactam (ZOSYN) IVPB 3.375 g  Status:  Discontinued        3.375 g 100 mL/hr over 30 Minutes Intravenous  Once 10/18/11 2029 10/18/11 2204   10/18/11 2045   vancomycin (VANCOCIN) IVPB 1000 mg/200 mL premix        1,000 mg 200 mL/hr over 60 Minutes Intravenous  Once 10/18/11 2029 10/18/11 2235          Medications:  Scheduled:   . acyclovir  10 mg/kg (Adjusted) Intravenous Q8H  . ALPRAZolam  1 mg Oral TID  . ampicillin (OMNIPEN) IV  2 g Intravenous  Q4H  . antiseptic oral rinse  15 mL Mouth Rinse QID  . bacitracin      . cefTRIAXone (ROCEPHIN)  IV  2 g Intravenous Q12H  . chlorhexidine  15 mL Mouth Rinse BID  . dexamethasone  4 mg Intravenous Q6H   Or  . dexamethasone  4 mg Oral Q6H  . fentaNYL      . insulin aspart  0-15 Units Subcutaneous Q4H  . insulin glargine  5 Units Subcutaneous QHS  . pantoprazole sodium  40 mg Per Tube Q1200  . potassium chloride  10 mEq Intravenous Q1 Hr x 4  . sodium chloride  3 mL Intravenous Q12H  . vancomycin  1,250 mg Intravenous Q12H  . DISCONTD: dexamethasone  10 mg Intravenous Q6H  . DISCONTD: feeding supplement (PROMOTE)  1,000 mL Per Tube Q24H  . DISCONTD: heparin  5,000 Units Subcutaneous Q8H  . DISCONTD: pantoprazole (PROTONIX) IV  40 mg Intravenous QHS    Objective: Vital signs in last  24 hours: Temp:  [97.3 F (36.3 C)-98.5 F (36.9 C)] 98.5 F (36.9 C) (06/03 1200) Pulse Rate:  [60-94] 94  (06/03 1500) Resp:  [13-25] 20  (06/03 1500) BP: (106-159)/(66-105) 144/79 mmHg (06/03 1500) SpO2:  [92 %-98 %] 92 % (06/03 1500) FiO2 (%):  [30 %-100 %] 30 % (06/03 0808) Weight:  [111.7 kg (246 lb 4.1 oz)] 111.7 kg (246 lb 4.1 oz) (06/02 2300)   General appearance: alert, cooperative and no distress Resp: clear to auscultation bilaterally Cardio: regular rate and rhythm GI: normal findings: bowel sounds normal and soft, non-tender Skin: skin breakdown/tear on abd.  Neuro- no grip LUE, good strength RUE, RLE. Some strength LLE.    Lab Results  Basename 10/20/11 0400 10/20/11 0125 10/19/11 1006 10/19/11 0333  WBC 12.2* -- 12.0* --  HGB 12.4* -- 13.1 --  HCT 35.4* -- 36.7* --  NA -- 136 -- 127*  K -- 3.7 -- 3.0*  CL -- 104 -- 94*  CO2 -- 22 -- 23  BUN -- 24* -- 24*  CREATININE -- 0.72 -- 0.75  GLU -- -- -- --   Liver Panel  Basename 10/20/11 0125 10/18/11 1910  PROT 5.8* 8.0  ALBUMIN 2.1* 3.3*  AST 47* 53*  ALT 28 33  ALKPHOS 50 75  BILITOT 0.4 1.1  BILIDIR -- --  IBILI  -- --   Sedimentation Rate No results found for this basename: ESRSEDRATE in the last 72 hours C-Reactive Protein No results found for this basename: CRP:2 in the last 72 hours  Microbiology: Recent Results (from the past 240 hour(s))  CULTURE, BLOOD (ROUTINE X 2)     Status: Normal (Preliminary result)   Collection Time   10/18/11  6:55 PM      Component Value Range Status Comment   Specimen Description BLOOD RIGHT ARM  5 ML IN [facility] BOTTLE   Final    Special Requests NONE   Final    Culture  Setup Time [id number]   Final    Culture     Final    Value: STAPHYLOCOCCUS AUREUS     Note: RIFAMPIN AND GENTAMICIN SHOULD NOT BE USED AS SINGLE DRUGS FOR TREATMENT OF STAPH INFECTIONS.     Note: Gram Stain Report Called to,Read Back By and Verified With: RN S. DILLON ON 10/19/11 AT 1500 BY DTERRY   Report Status PENDING   Incomplete   URINE CULTURE     Status: Normal   Collection Time   10/18/11  8:14 PM      Component Value Range Status Comment   Specimen Description URINE, CATHETERIZED   Final    Special Requests NONE   Final    Culture  Setup Time [id number]   Final    Colony Count NO GROWTH   Final    Culture NO GROWTH   Final    Report Status 10/20/2011 FINAL   Final   CULTURE, BLOOD (ROUTINE X 2)     Status: Normal (Preliminary result)   Collection Time   10/18/11  9:15 PM      Component Value Range Status Comment   Specimen Description BLOOD LEFT HAND   Final    Special Requests BOTTLES DRAWN AEROBIC AND ANAEROBIC 4CC   Final    Culture  Setup Time [id number]   Final    Culture     Final    Value: STAPHYLOCOCCUS AUREUS     Note: Gram Stain Report Called to,Read Back By and Verified With:  RN S. DILLON ON 10/19/11 AT 1840 BY DTERRY   Report Status PENDING   Incomplete   CSF CULTURE     Status: Normal (Preliminary result)   Collection Time   10/18/11 11:45 PM      Component Value Range Status Comment   Specimen Description CSF   Final    Special Requests Normal   Final    Gram  Stain     Final    Value: CYTOSPIN SLIDE WBC PRESENT,BOTH PMN AND MONONUCLEAR     NO ORGANISMS SEEN     Gram Stain Report Called to,Read Back By and Verified With: Gram Stain Report Called to,Read Back By and Verified With: TWOODS RN AT 0141 ON [id number] BY DLONG Performed by [facility]   Culture NO GROWTH 1 DAY   Final    Report Status PENDING   Incomplete   GRAM STAIN     Status: Normal   Collection Time   10/18/11 11:45 PM      Component Value Range Status Comment   Specimen Description CSF   Final    Special Requests Normal   Final    Gram Stain     Final    Value: WBC PRESENT,BOTH PMN AND MONONUCLEAR     NO ORGANISMS SEEN     CYTO SPUN SLIDE     Gram Stain Report Called to,Read Back By and Verified With: TWOODS RN AT 0141 ON [id number] BY DLONG   Report Status 10/19/2011 FINAL   Final   MRSA PCR SCREENING     Status: Normal   Collection Time   10/19/11 12:23 AM      Component Value Range Status Comment   MRSA by PCR NEGATIVE  NEGATIVE  Final   AFB CULTURE WITH SMEAR     Status: Normal (Preliminary result)   Collection Time   10/19/11  8:29 PM      Component Value Range Status Comment   Specimen Description WOUND NECK CERVICAL FOUR FIVE DISC SPACE   Final    Special Requests NONE   Final    ACID FAST SMEAR NO ACID FAST BACILLI SEEN   Final    Culture     Final    Value: CULTURE WILL BE EXAMINED FOR 6 WEEKS BEFORE ISSUING A FINAL REPORT   Report Status PENDING   Incomplete   GRAM STAIN     Status: Normal   Collection Time   10/19/11  8:29 PM      Component Value Range Status Comment   Specimen Description WOUND NECK CERVICAL FOUR FIVE DISC SPACE   Final    Special Requests NONE   Final    Gram Stain     Final    Value: RARE WBC PRESENT, PREDOMINANTLY MONONUCLEAR     NO ORGANISMS SEEN   Report Status 10/19/2011 FINAL   Final   AFB CULTURE WITH SMEAR     Status: Normal (Preliminary result)   Collection Time   10/19/11  8:34 PM      Component Value Range Status Comment    Specimen Description TISSUE NECK CERVICAL FOUR FIVE DISC   Final    Special Requests NONE   Final    ACID FAST SMEAR NO ACID FAST BACILLI SEEN   Final    Culture     Final    Value: CULTURE WILL BE EXAMINED FOR 6 WEEKS BEFORE ISSUING A FINAL REPORT   Report Status PENDING   Incomplete   GRAM STAIN  Status: Normal   Collection Time   10/19/11  8:34 PM      Component Value Range Status Comment   Specimen Description TISSUE NECK CERVICAL FOUR FIVE DISC   Final    Special Requests NONE   Final    Gram Stain     Final    Value: NO WBC SEEN     NO ORGANISMS SEEN   Report Status 10/19/2011 FINAL   Final   GRAM STAIN     Status: Normal   Collection Time   10/19/11 10:14 PM      Component Value Range Status Comment   Specimen Description NECK CERVICAL FOUR FIVE WPIDURAL DISC SPACE   Final    Special Requests NONE   Final    Gram Stain     Final    Value: FEW WBC PRESENT,BOTH PMN AND MONONUCLEAR     RARE GRAM POSITIVE COCCI IN PAIRS   Report Status 10/19/2011 FINAL   Final   AFB CULTURE WITH SMEAR     Status: Normal (Preliminary result)   Collection Time   10/19/11 10:14 PM      Component Value Range Status Comment   Specimen Description NECK CERVICAL FOUR FIVE WPIDURAL DISC SPACE   Final    Special Requests NONE   Final    ACID FAST SMEAR NO ACID FAST BACILLI SEEN   Final    Culture     Final    Value: CULTURE WILL BE EXAMINED FOR 6 WEEKS BEFORE ISSUING A FINAL REPORT   Report Status PENDING   Incomplete     Studies/Results: Dg Cervical Spine 2-3 Views  10/20/2011  *RADIOLOGY REPORT*  Clinical Data: Cervical fusion.  CERVICAL SPINE - 2-3 VIEW  Comparison: MRI cervical spine 10/19/2011.  Findings: Lateral cervical spine films demonstrate anterior plate and screws and interbody bone plug fusing C4-5 and C5-6.  No complicating features are demonstrated.  IMPRESSION: Anterior and interbody fusion changes at C4-5 and C5-6.  Original Report Authenticated By: P. Loralie Champagne, M.D.   Ct Head  Wo Contrast  10/18/2011  *RADIOLOGY REPORT*  Clinical Data: 61 year old male found down.  Weakness.  CT HEAD WITHOUT CONTRAST  Technique:  Contiguous axial images were obtained from the base of the skull through the vertex without contrast.  Comparison: None.  Findings: Mixed layering and bubbly opacity in the right maxillary sinus.  Other Visualized paranasal sinuses and mastoids are clear. No acute osseous abnormality identified.  Visualized orbits and scalp soft tissues are within normal limits.  Calcified atherosclerosis at the skull base.  No suspicious intracranial vascular hyperdensity.  No ventriculomegaly. No midline shift, mass effect, or evidence of mass lesion.  No acute intracranial hemorrhage identified.  No evidence of cortically based acute infarction identified.  IMPRESSION: 1.  Normal for age noncontrast CT appearance of the brain. 2.  Right maxillary sinusitis.  Original Report Authenticated By: Harley Hallmark, M.D.   Mr Laqueta Jean Wo Contrast  10/19/2011  **ADDENDUM** CREATED: 10/19/2011 17:45:12  These results were called by telephone on 10/19/2011  at  05:45 p.m. to  Judeth Cornfield, RN, ICU, who verbally acknowledged these results.  **END ADDENDUM** SIGNED BY: Chauncey Fischer, M.D.   10/19/2011  *RADIOLOGY REPORT*  Clinical Data:  Left-sided weakness.  Status post fall lethargy. Evaluate for epidural abscess.  MRI HEAD WITHOUT AND WITH CONTRAST MRI CERVICAL AND THORACIC SPINE WITHOUT AND WITH CONTRAST  Technique:  Multiplanar, multiecho pulse sequences of the brain and surrounding structures, the cervical  spine, to include the craniocervical junction and cervicothoracic junction, and the thoracic spine, were obtained without and with intravenous contrast.  Contrast: 20mL MULTIHANCE GADOBENATE DIMEGLUMINE 529 MG/ML IV SOLN  Comparison:  CT head without contrast is 10/19/2011.  MRI HEAD  Findings:  The diffusion weighted images demonstrate no evidence for acute or subacute infarction.  Scattered  periventricular and subcortical T2 and FLAIR hyperintensities are greater than expected for age.  There is abnormal signal in the CSF on the FLAIR images. No pathologic enhancement is evident on the postcontrast images.  Flow is present in the major intracranial arteries.  The globes orbits are intact.  A fluid level is present in the right maxillary sinus.  The paranasal sinuses are otherwise clear.  Minimal fluid is present in the right mastoid air cells.  There is fluid in the nasopharynx.  The patient is intubated.  IMPRESSION:  1.  Incomplete CSF saturation over the convexities bilaterally. This may be related to the patient's recent lumbar puncture or possibly due to oxygenation.  Abnormal CSF is also considered. Elevated white cells were noted on the recent lumbar puncture. 2.  Scattered periventricular and subcortical white matter disease is advanced for age. The finding is nonspecific but can be seen in the setting of chronic microvascular ischemia, a demyelinating process such as multiple sclerosis, vasculitis, complicated migraine headaches, or as the sequelae of a prior infectious or inflammatory process. 3.  Mild sinus disease is potentially related to intubation.  MRI CERVICAL SPINE  Findings:  Normal signal is present in the cervical spine.  The craniocervical junction is within normal limits.  Flow is present in the major vascular structures of the neck.  C2-3:  Minimal disc bulging is present.  There is no significant stenosis.  C3-4:  A leftward disc osteophyte complex effaces the ventral CSF. Mild left foraminal narrowing is secondary to disc and uncovertebral spurring.  C4-5:  A diffuse disc herniation is present.  There is superior and inferior extrusion of disc material extending 5 mm above and 6 mm below the disc level.  Moderate central canal stenosis is present. The canal is narrowed to 6 mm.  Moderate foraminal stenosis is worse on the left.  C5-6:  A broad-based disc herniation is present.   There is flattening of the ventral cord.  The canal is narrowed to 8 mm. Moderate to severe foraminal stenosis is present bilaterally.  C6-7:  No significant disc herniation or stenosis is present.  C7-T1:  No significant disc herniation or stenosis is present.  Diffuse dural enhancement is present throughout the cervical spine. There is enhancement around what is presumed to be disc at C4-5 and at C5-6.  There is some enhancement within the disc space at C5-6. No definite marrow enhancement is present. There is some enhancement of the anterior paraspinous musculature on the left at to C3-4, C4-5, and C5-6.  IMPRESSION: 1.  Diffuse dural enhancement throughout the cervical spine.  This could be related to infection, potentially from the C5-6 disc space.  Some dural enhancement can be seen normally following lumbar puncture. 2.  Large disc herniation with superior and inferior exterior extrusion at C4-5.  Enhancement surrounds the disc without a definite abscess. 3.  Broad-based disc herniation with some internal enhancement of the disc at C5-6. Infection is not excluded. 4.  Left paraspinous muscle enhancement also raises suspicion for infection. 5.  Disc bulging at C3-4 as well as uncovertebral spurring leads to mild left foraminal narrowing. 5.  Moderate  central canal stenosis at C4-5 greater than C5-6. 6.  Moderate foraminal stenosis bilaterally at C4-5. 7.  Moderate to severe foraminal stenosis bilaterally at C5-6.  MRI THORACIC SPINE  Findings: The thoracic spinal cord is within normal limits.  The conus medullaris appears to be terminating at L1 but is incompletely imaged.  The postcontrast images demonstrate some posterior dural enhancement to the level of T4.  There is mild dural enhancement at this T11 and T12 as well.  There is no abscess.  A superior endplate Schmorl's node and remote compression fracture is present at T12.  There is focal thickening of the ligamentum flavum on the left at T10-11 with  slight encroachment on the CSF but no significant stenosis.  The foramina are patent bilaterally.  The postcontrast images are otherwise unremarkable.  Mild dependent atelectasis is present in the lungs bilaterally. The patient is intubated.  An NG tube is in place.  The soft tissues are otherwise unremarkable.  IMPRESSION:  1.  Mild dural enhancement without evidence for an abscess.  This is likely related to dural infection.  Such findings could be seen following recent lumbar puncture. 2.  Minimal posterior encroachment by thickening of the ligamentum flavum on the left at T10-11. Original Report Authenticated By: Jamesetta Orleans. MATTERN, M.D.   Mr Cervical Spine W Wo Contrast  10/19/2011  **ADDENDUM** CREATED: 10/19/2011 17:45:12  These results were called by telephone on 10/19/2011  at  05:45 p.m. to  Judeth Cornfield, RN, ICU, who verbally acknowledged these results.  **END ADDENDUM** SIGNED BY: Chauncey Fischer, M.D.   10/19/2011  *RADIOLOGY REPORT*  Clinical Data:  Left-sided weakness.  Status post fall lethargy. Evaluate for epidural abscess.  MRI HEAD WITHOUT AND WITH CONTRAST MRI CERVICAL AND THORACIC SPINE WITHOUT AND WITH CONTRAST  Technique:  Multiplanar, multiecho pulse sequences of the brain and surrounding structures, the cervical spine, to include the craniocervical junction and cervicothoracic junction, and the thoracic spine, were obtained without and with intravenous contrast.  Contrast: 20mL MULTIHANCE GADOBENATE DIMEGLUMINE 529 MG/ML IV SOLN  Comparison:  CT head without contrast is 10/19/2011.  MRI HEAD  Findings:  The diffusion weighted images demonstrate no evidence for acute or subacute infarction.  Scattered periventricular and subcortical T2 and FLAIR hyperintensities are greater than expected for age.  There is abnormal signal in the CSF on the FLAIR images. No pathologic enhancement is evident on the postcontrast images.  Flow is present in the major intracranial arteries.  The globes  orbits are intact.  A fluid level is present in the right maxillary sinus.  The paranasal sinuses are otherwise clear.  Minimal fluid is present in the right mastoid air cells.  There is fluid in the nasopharynx.  The patient is intubated.  IMPRESSION:  1.  Incomplete CSF saturation over the convexities bilaterally. This may be related to the patient's recent lumbar puncture or possibly due to oxygenation.  Abnormal CSF is also considered. Elevated white cells were noted on the recent lumbar puncture. 2.  Scattered periventricular and subcortical white matter disease is advanced for age. The finding is nonspecific but can be seen in the setting of chronic microvascular ischemia, a demyelinating process such as multiple sclerosis, vasculitis, complicated migraine headaches, or as the sequelae of a prior infectious or inflammatory process. 3.  Mild sinus disease is potentially related to intubation.  MRI CERVICAL SPINE  Findings:  Normal signal is present in the cervical spine.  The craniocervical junction is within normal limits.  Flow is  present in the major vascular structures of the neck.  C2-3:  Minimal disc bulging is present.  There is no significant stenosis.  C3-4:  A leftward disc osteophyte complex effaces the ventral CSF. Mild left foraminal narrowing is secondary to disc and uncovertebral spurring.  C4-5:  A diffuse disc herniation is present.  There is superior and inferior extrusion of disc material extending 5 mm above and 6 mm below the disc level.  Moderate central canal stenosis is present. The canal is narrowed to 6 mm.  Moderate foraminal stenosis is worse on the left.  C5-6:  A broad-based disc herniation is present.  There is flattening of the ventral cord.  The canal is narrowed to 8 mm. Moderate to severe foraminal stenosis is present bilaterally.  C6-7:  No significant disc herniation or stenosis is present.  C7-T1:  No significant disc herniation or stenosis is present.  Diffuse dural  enhancement is present throughout the cervical spine. There is enhancement around what is presumed to be disc at C4-5 and at C5-6.  There is some enhancement within the disc space at C5-6. No definite marrow enhancement is present. There is some enhancement of the anterior paraspinous musculature on the left at to C3-4, C4-5, and C5-6.  IMPRESSION: 1.  Diffuse dural enhancement throughout the cervical spine.  This could be related to infection, potentially from the C5-6 disc space.  Some dural enhancement can be seen normally following lumbar puncture. 2.  Large disc herniation with superior and inferior exterior extrusion at C4-5.  Enhancement surrounds the disc without a definite abscess. 3.  Broad-based disc herniation with some internal enhancement of the disc at C5-6. Infection is not excluded. 4.  Left paraspinous muscle enhancement also raises suspicion for infection. 5.  Disc bulging at C3-4 as well as uncovertebral spurring leads to mild left foraminal narrowing. 5.  Moderate central canal stenosis at C4-5 greater than C5-6. 6.  Moderate foraminal stenosis bilaterally at C4-5. 7.  Moderate to severe foraminal stenosis bilaterally at C5-6.  MRI THORACIC SPINE  Findings: The thoracic spinal cord is within normal limits.  The conus medullaris appears to be terminating at L1 but is incompletely imaged.  The postcontrast images demonstrate some posterior dural enhancement to the level of T4.  There is mild dural enhancement at this T11 and T12 as well.  There is no abscess.  A superior endplate Schmorl's node and remote compression fracture is present at T12.  There is focal thickening of the ligamentum flavum on the left at T10-11 with slight encroachment on the CSF but no significant stenosis.  The foramina are patent bilaterally.  The postcontrast images are otherwise unremarkable.  Mild dependent atelectasis is present in the lungs bilaterally. The patient is intubated.  An NG tube is in place.  The soft  tissues are otherwise unremarkable.  IMPRESSION:  1.  Mild dural enhancement without evidence for an abscess.  This is likely related to dural infection.  Such findings could be seen following recent lumbar puncture. 2.  Minimal posterior encroachment by thickening of the ligamentum flavum on the left at T10-11. Original Report Authenticated By: Jamesetta Orleans. MATTERN, M.D.   Mr Thoracic Spine W Wo Contrast  10/19/2011  **ADDENDUM** CREATED: 10/19/2011 17:45:12  These results were called by telephone on 10/19/2011  at  05:45 p.m. to  Judeth Cornfield, RN, ICU, who verbally acknowledged these results.  **END ADDENDUM** SIGNED BY: Chauncey Fischer, M.D.   10/19/2011  *RADIOLOGY REPORT*  Clinical Data:  Left-sided weakness.  Status post fall lethargy. Evaluate for epidural abscess.  MRI HEAD WITHOUT AND WITH CONTRAST MRI CERVICAL AND THORACIC SPINE WITHOUT AND WITH CONTRAST  Technique:  Multiplanar, multiecho pulse sequences of the brain and surrounding structures, the cervical spine, to include the craniocervical junction and cervicothoracic junction, and the thoracic spine, were obtained without and with intravenous contrast.  Contrast: 20mL MULTIHANCE GADOBENATE DIMEGLUMINE 529 MG/ML IV SOLN  Comparison:  CT head without contrast is 10/19/2011.  MRI HEAD  Findings:  The diffusion weighted images demonstrate no evidence for acute or subacute infarction.  Scattered periventricular and subcortical T2 and FLAIR hyperintensities are greater than expected for age.  There is abnormal signal in the CSF on the FLAIR images. No pathologic enhancement is evident on the postcontrast images.  Flow is present in the major intracranial arteries.  The globes orbits are intact.  A fluid level is present in the right maxillary sinus.  The paranasal sinuses are otherwise clear.  Minimal fluid is present in the right mastoid air cells.  There is fluid in the nasopharynx.  The patient is intubated.  IMPRESSION:  1.  Incomplete CSF  saturation over the convexities bilaterally. This may be related to the patient's recent lumbar puncture or possibly due to oxygenation.  Abnormal CSF is also considered. Elevated white cells were noted on the recent lumbar puncture. 2.  Scattered periventricular and subcortical white matter disease is advanced for age. The finding is nonspecific but can be seen in the setting of chronic microvascular ischemia, a demyelinating process such as multiple sclerosis, vasculitis, complicated migraine headaches, or as the sequelae of a prior infectious or inflammatory process. 3.  Mild sinus disease is potentially related to intubation.  MRI CERVICAL SPINE  Findings:  Normal signal is present in the cervical spine.  The craniocervical junction is within normal limits.  Flow is present in the major vascular structures of the neck.  C2-3:  Minimal disc bulging is present.  There is no significant stenosis.  C3-4:  A leftward disc osteophyte complex effaces the ventral CSF. Mild left foraminal narrowing is secondary to disc and uncovertebral spurring.  C4-5:  A diffuse disc herniation is present.  There is superior and inferior extrusion of disc material extending 5 mm above and 6 mm below the disc level.  Moderate central canal stenosis is present. The canal is narrowed to 6 mm.  Moderate foraminal stenosis is worse on the left.  C5-6:  A broad-based disc herniation is present.  There is flattening of the ventral cord.  The canal is narrowed to 8 mm. Moderate to severe foraminal stenosis is present bilaterally.  C6-7:  No significant disc herniation or stenosis is present.  C7-T1:  No significant disc herniation or stenosis is present.  Diffuse dural enhancement is present throughout the cervical spine. There is enhancement around what is presumed to be disc at C4-5 and at C5-6.  There is some enhancement within the disc space at C5-6. No definite marrow enhancement is present. There is some enhancement of the anterior  paraspinous musculature on the left at to C3-4, C4-5, and C5-6.  IMPRESSION: 1.  Diffuse dural enhancement throughout the cervical spine.  This could be related to infection, potentially from the C5-6 disc space.  Some dural enhancement can be seen normally following lumbar puncture. 2.  Large disc herniation with superior and inferior exterior extrusion at C4-5.  Enhancement surrounds the disc without a definite abscess. 3.  Broad-based disc herniation with some  internal enhancement of the disc at C5-6. Infection is not excluded. 4.  Left paraspinous muscle enhancement also raises suspicion for infection. 5.  Disc bulging at C3-4 as well as uncovertebral spurring leads to mild left foraminal narrowing. 5.  Moderate central canal stenosis at C4-5 greater than C5-6. 6.  Moderate foraminal stenosis bilaterally at C4-5. 7.  Moderate to severe foraminal stenosis bilaterally at C5-6.  MRI THORACIC SPINE  Findings: The thoracic spinal cord is within normal limits.  The conus medullaris appears to be terminating at L1 but is incompletely imaged.  The postcontrast images demonstrate some posterior dural enhancement to the level of T4.  There is mild dural enhancement at this T11 and T12 as well.  There is no abscess.  A superior endplate Schmorl's node and remote compression fracture is present at T12.  There is focal thickening of the ligamentum flavum on the left at T10-11 with slight encroachment on the CSF but no significant stenosis.  The foramina are patent bilaterally.  The postcontrast images are otherwise unremarkable.  Mild dependent atelectasis is present in the lungs bilaterally. The patient is intubated.  An NG tube is in place.  The soft tissues are otherwise unremarkable.  IMPRESSION:  1.  Mild dural enhancement without evidence for an abscess.  This is likely related to dural infection.  Such findings could be seen following recent lumbar puncture. 2.  Minimal posterior encroachment by thickening of the  ligamentum flavum on the left at T10-11. Original Report Authenticated By: Jamesetta Orleans. MATTERN, M.D.   Dg Chest Port 1 View  10/20/2011  *RADIOLOGY REPORT*  Clinical Data: Evaluate endotracheal tube position  PORTABLE CHEST - 1 VIEW  Comparison: 10/17/1936  Findings: Endotracheal tube terminates 4.0 cm above carina. Nasogastric tube extends beyond the  inferior aspect of the film. Mild cardiomegaly.  Right hemidiaphragm elevation is mild. No pleural effusion or pneumothorax.  No congestive failure.  Minimal right base subsegmental atelectasis.  Improved left base aeration.  IMPRESSION: Improved bibasilar atelectasis. No acute findings.  Original Report Authenticated By: Consuello Bossier, M.D.   Dg Chest Port 1 View  10/18/2011  *RADIOLOGY REPORT*  Clinical Data: Evaluate endotracheal tube.  Hypertension.  PORTABLE CHEST - 1 VIEW  Comparison: 10/18/2011 at the 1953 hours  Findings: Interval placement of endotracheal tube with tip about 4.8 cm above the carina.  An enteric tube has been placed.  The tip is not visualized but is below the left hemidiaphragm consistent with location at least in the stomach.  Shallow inspiration.  Mild cardiac enlargement with suggestion of mild pulmonary vascular congestion.  Linear atelectasis in the lung bases.  No pneumothorax.  No blunting of costophrenic angles.  IMPRESSION: Endotracheal tube placed with tip about 4.8 cm above the carina.  Original Report Authenticated By: Marlon Pel, M.D.   Dg Chest Port 1 View  10/18/2011  *RADIOLOGY REPORT*  Clinical Data: 61 year old male with hypertension and altered mental status.  PORTABLE CHEST - 1 VIEW  Comparison: None.  Findings: Upright AP view 1953 hours.  Low lung volumes.  Right greater than left streaky bibasilar opacity.  No pneumothorax, pulmonary edema or definite effusion. There is cardiomegaly.  Other mediastinal contours are within normal limits.  Visualized tracheal air column is within normal limits.   IMPRESSION: Low lung volumes with right greater than left basilar opacity. Favor atelectasis but lung base infection cannot be excluded.  Original Report Authenticated By: Harley Hallmark, M.D.     Assessment/Plan: C4-6 disc  herniation with possible discitis and epidural abscess with spinal cord compression C4-5 GPC on op specimen BCx 2/2 s aureus  Will  Change anbx to vanco alone. Advise TEE Recheck BCx consider stop decadron- d/i Dr Jerry Caras Infectious Diseases [phone] 10/20/2011, 3:56 PM   LOS: 2 days

## 2011-10-20 NOTE — Progress Notes (Signed)
Inpatient Diabetes Program Recommendations  AACE/ADA: New Consensus Statement on Inpatient Glycemic Control (2009)  Target Ranges:  Prepandial:   less than 140 mg/dL      Peak postprandial:   less than 180 mg/dL (1-2 hours)      Critically ill patients:  140 - 180 mg/dL   Reason for Visit: Hyperglycemia  Charles Ball is a 61 year-old gentleman who was found down at home. He had likely fallen out of bed, and indicated he was unable to get up. He was conversant at that time, and denied loss of consciousness. He subsequently became progressively altered and febrile. CT head showed air and fluid in right maxillary sinus.  No hx DM.  Results for [name], [name] (MRN [id number]) as of 10/20/2011 09:52  Ref. Range 10/18/2011 18:31 10/19/2011 08:42 10/19/2011 11:58 10/19/2011 18:17  Glucose-Capillary Latest Range: 70-99 mg/dL 409 (H) 811 (H) 914 (H) 194 (H)  Results for [name], [name] (MRN [id number]) as of 10/20/2011 09:52  Ref. Range 10/19/2011 03:33 10/20/2011 01:25  Glucose Latest Range: 70-99 mg/dL 086 (H) 578 (H)      Note: Recommend ICU Hyperglycemia Protocol for glucose control. Will follow.

## 2011-10-20 NOTE — Progress Notes (Signed)
Name: Charles Ball MRN: [id number] DOB: 1951-02-19    LOS: 2  PULMONARY / CRITICAL CARE MEDICINE FOLLOW UP NOTE  HPI: 61 year old male admitted initially to [facility] 6/1 for presumed meningitis. He was found down with decreased movement of all four extremities. An MRI of the cervical spine showed C4-C5 and C5-C6 herniation and was transferred emergently to [facility] 6/2 for surgical decompression. Of note his blood cultures grew GPC 4/4. At the time of my examination the patient is sedated with propofol and fentanyl and on mechanical ventilation.   Current Status: Intubated and sedated, on mechanical ventilation. SUBJ : denies pain,   Vital Signs: Temp:  [97.3 F (36.3 C)-100 F (37.8 C)] 97.5 F (36.4 C) (06/03 0800) Pulse Rate:  [60-86] 73  (06/03 0808) Resp:  [13-27] 16  (06/03 0808) BP: (106-155)/(66-104) 118/77 mmHg (06/03 0808) SpO2:  [94 %-99 %] 97 % (06/03 0808) FiO2 (%):  [30 %-100 %] 30 % (06/03 0808) Weight:  [111.7 kg (246 lb 4.1 oz)] 111.7 kg (246 lb 4.1 oz) (06/02 2300)  Physical Examination: General:  Intubated, mechanically ventilated, no acute distress Neuro:  Sedated, triggering the vent, synchronous. Alert, interactive, power 3/5 on rt ,flicker movement 1/5 on LUE and both lower extremities. HEENT:  PERRL, pink conjunctivae, moist membranes Neck:  Supple, no JVD. Anterior access for cervical decompression surgery, c-collar  Cardiovascular:  RRR, no M/R/G Lungs:  Adequate air entry bilaterally, no W/R/R Abdomen:  Soft, nontender, nondistended, bowel sounds present Musculoskeletal:  Moves all extremities, no pedal edema Skin:  Superficial abdominal pressure ulcer covered    ASSESSMENT AND PLAN  PULMONARY  Lab 10/20/11 0400 10/19/11 0952 10/19/11 0514 10/18/11 2155  PHART 7.436 7.523* 7.512* 7.441  PCO2ART 36.9 25.5* 29.0* 33.3*  PO2ART 108.0* 81.3 107.0* 386.0*  HCO3 24.4* 20.9 23.0 22.3  O2SAT 98.1 97.2 98.6 99.6   Ventilator Settings: Vent Mode:  [-]  PSV FiO2 (%):  [30 %-100 %] 30 % Set Rate:  [14 bmp] 14 bmp Vt Set:  [600 mL-660 mL] 600 mL PEEP:  [5 cmH20] 5 cmH20 Pressure Support:  [5 cmH20] 5 cmH20 Plateau Pressure:  [14 cmH20-21 cmH20] 14 cmH20  ETT:  6/2 >> CXR : 6/3 bibasal atx, minimal elevation rt hemiDiaphragm  A:  Respiratory failure, on mechanical ventilation for airway protection. P:   - tolerating  SBT -excellent Tv on 5/5, proceed with extubation  CARDIOVASCULAR  Lab 10/18/11 2115 10/18/11 1910  TROPONINI -- <0.30  LATICACIDVEN 2.9* --  PROBNP -- --   ECG:  Normal sinus rhythm Lines: Peripheral lines  A: No issues, hemodynamically stable P:  Continue ICU monitoring.  RENAL  Lab 10/20/11 0125 10/19/11 0333 10/18/11 1910  NA 136 127* 123*  K 3.7 3.0* --  CL 104 94* 88*  CO2 22 23 21   BUN 24* 24* 28*  CREATININE 0.72 0.75 0.73  CALCIUM 7.5* 7.5* 8.6  MG 2.4 2.0 --  PHOS 2.3 1.3* --   Intake/Output      06/02 0701 - 06/03 0700 06/03 0701 - 06/04 0700   I.V. (mL/kg) 2682.5 (24)    IV Piggyback 869    Total Intake(mL/kg) 3551.5 (31.8)    Urine (mL/kg/hr) 2530 (0.9)    Stool 1    Blood 50    Total Output 2581    Net +970.5          Foley:  6/1  A:  Hyponatremia, hypophosphatemia and hypokalemia on last labs. P:  resolved  GASTROINTESTINAL  Lab 10/20/11 0125 10/18/11 1910  AST 47* 53*  ALT 28 33  ALKPHOS 50 75  BILITOT 0.4 1.1  PROT 5.8* 8.0  ALBUMIN 2.1* 3.3*    A:  No issues P:   - hold TF - PPI prophylaxis   HEMATOLOGIC  Lab 10/20/11 0400 10/19/11 1006 10/19/11 0333 10/18/11 1910  HGB 12.4* 13.1 13.0 16.0  HCT 35.4* 36.7* 37.0* 43.8  PLT 93* 100* 95* 113*  INR -- -- -- --  APTT -- -- -- --   A:  Thrombocytopenia stable P:  - resume DVT prophylaxis with sub q heparin when ok with surgery - SCDs  INFECTIOUS  Lab 10/20/11 0400 10/19/11 1006 10/19/11 0333 10/18/11 2115 10/18/11 1910  WBC 12.2* 12.0* 11.1* -- 12.9*  PROCALCITON -- -- -- 2.09 --    Cultures: Blood cultures 6/1: GPC's 4/4 >> Urine 6/1:ng CSF 6/1: ng Wound cx  6/2 - g pos cocci in pairs >> Antibiotics: Vanc 6/1 >>>  Ceftiraxone 6/1 >>>  Ampicillin 6/1 >>>   A:   GPC bacteremia Acute sinusitis Questionable meningitis vs parameningeal infalmamtion from c-spine issues (favor - note pos wound cx) - not obvious epidural abscess- WBCs 12-46 in CSF with predom neutrophils   P:   ID recommendations appreciated - doubt listeria or HSV Will continue current antibiotics and acyclovir  ENDOCRINE  Lab 10/19/11 1817 10/19/11 1158 10/19/11 0842 10/18/11 1831  GLUCAP 194* 196* 248* 161*   A:  Hyperglycemia P:   Continue SSI and lantus with TF start.  NEUROLOGIC  A:  C4-C5, C5-C6 compression. Lt hemiparesis  P:   -Post surgical decompression. - dc propofol , dilaudid,  ct fentanyl prn   BEST PRACTICE / DISPOSITION - Level of Care:  ICU - Primary Service:  PCCM - Consultants:  ID and neurosurgery - Code Status:  Full - Diet:  Will start TF - DVT Px:  Heparin sub q - GI Px:  Protonix - Skin Integrity:  Abdominal pressure ulcer present at admission. Clean and covered.  - Social / Family:  No family at bedside at the time of my exam.   Cyril Mourning MD. FCCP. [facility] Pager [phone] If no response call [phone]    10/20/2011, 8:27 AM

## 2011-10-20 NOTE — Progress Notes (Signed)
Patient ID: Charles Ball, male   DOB: November 22, 1950, 61 y.o.   MRN: [id number] Subjective: Patient reports Doing ok  Objective: Vital signs in last 24 hours: Temp:  [97.3 F (36.3 C)-100 F (37.8 C)] 97.5 F (36.4 C) (06/03 0800) Pulse Rate:  [60-86] 73  (06/03 0808) Resp:  [13-27] 16  (06/03 0808) BP: (106-155)/(66-104) 118/77 mmHg (06/03 0808) SpO2:  [94 %-99 %] 97 % (06/03 0808) FiO2 (%):  [30 %-100 %] 30 % (06/03 0808) Weight:  [111.7 kg (246 lb 4.1 oz)] 111.7 kg (246 lb 4.1 oz) (06/02 2300)  Intake/Output from previous day: 06/02 0701 - 06/03 0700 In: 3551.5 [I.V.:2682.5; IV Piggyback:869] Out: 2581 [Urine:2530; Stool:1; Blood:50] Intake/Output this shift:    awake, alert. Strength good on right, less on left though hard to assess due to bedrest, intubation.  Lab Results:  Basename 10/20/11 0400 10/19/11 1006  WBC 12.2* 12.0*  HGB 12.4* 13.1  HCT 35.4* 36.7*  PLT 93* 100*   BMET  Basename 10/20/11 0125 10/19/11 0333  NA 136 127*  K 3.7 3.0*  CL 104 94*  CO2 22 23  GLUCOSE 201* 245*  BUN 24* 24*  CREATININE 0.72 0.75  CALCIUM 7.5* 7.5*    Studies/Results: Dg Cervical Spine 2-3 Views  10/20/2011  *RADIOLOGY REPORT*  Clinical Data: Cervical fusion.  CERVICAL SPINE - 2-3 VIEW  Comparison: MRI cervical spine 10/19/2011.  Findings: Lateral cervical spine films demonstrate anterior plate and screws and interbody bone plug fusing C4-5 and C5-6.  No complicating features are demonstrated.  IMPRESSION: Anterior and interbody fusion changes at C4-5 and C5-6.  Original Report Authenticated By: P. Loralie Champagne, M.D.   Ct Head Wo Contrast  10/18/2011  *RADIOLOGY REPORT*  Clinical Data: 61 year old male found down.  Weakness.  CT HEAD WITHOUT CONTRAST  Technique:  Contiguous axial images were obtained from the base of the skull through the vertex without contrast.  Comparison: None.  Findings: Mixed layering and bubbly opacity in the right maxillary sinus.  Other Visualized  paranasal sinuses and mastoids are clear. No acute osseous abnormality identified.  Visualized orbits and scalp soft tissues are within normal limits.  Calcified atherosclerosis at the skull base.  No suspicious intracranial vascular hyperdensity.  No ventriculomegaly. No midline shift, mass effect, or evidence of mass lesion.  No acute intracranial hemorrhage identified.  No evidence of cortically based acute infarction identified.  IMPRESSION: 1.  Normal for age noncontrast CT appearance of the brain. 2.  Right maxillary sinusitis.  Original Report Authenticated By: Harley Hallmark, M.D.   Mr Laqueta Jean Wo Contrast  10/19/2011  **ADDENDUM** CREATED: 10/19/2011 17:45:12  These results were called by telephone on 10/19/2011  at  05:45 p.m. to  Judeth Cornfield, RN, ICU, who verbally acknowledged these results.  **END ADDENDUM** SIGNED BY: Chauncey Fischer, M.D.   10/19/2011  *RADIOLOGY REPORT*  Clinical Data:  Left-sided weakness.  Status post fall lethargy. Evaluate for epidural abscess.  MRI HEAD WITHOUT AND WITH CONTRAST MRI CERVICAL AND THORACIC SPINE WITHOUT AND WITH CONTRAST  Technique:  Multiplanar, multiecho pulse sequences of the brain and surrounding structures, the cervical spine, to include the craniocervical junction and cervicothoracic junction, and the thoracic spine, were obtained without and with intravenous contrast.  Contrast: 20mL MULTIHANCE GADOBENATE DIMEGLUMINE 529 MG/ML IV SOLN  Comparison:  CT head without contrast is 10/19/2011.  MRI HEAD  Findings:  The diffusion weighted images demonstrate no evidence for acute or subacute infarction.  Scattered periventricular and subcortical T2  and FLAIR hyperintensities are greater than expected for age.  There is abnormal signal in the CSF on the FLAIR images. No pathologic enhancement is evident on the postcontrast images.  Flow is present in the major intracranial arteries.  The globes orbits are intact.  A fluid level is present in the right maxillary  sinus.  The paranasal sinuses are otherwise clear.  Minimal fluid is present in the right mastoid air cells.  There is fluid in the nasopharynx.  The patient is intubated.  IMPRESSION:  1.  Incomplete CSF saturation over the convexities bilaterally. This may be related to the patient's recent lumbar puncture or possibly due to oxygenation.  Abnormal CSF is also considered. Elevated white cells were noted on the recent lumbar puncture. 2.  Scattered periventricular and subcortical white matter disease is advanced for age. The finding is nonspecific but can be seen in the setting of chronic microvascular ischemia, a demyelinating process such as multiple sclerosis, vasculitis, complicated migraine headaches, or as the sequelae of a prior infectious or inflammatory process. 3.  Mild sinus disease is potentially related to intubation.  MRI CERVICAL SPINE  Findings:  Normal signal is present in the cervical spine.  The craniocervical junction is within normal limits.  Flow is present in the major vascular structures of the neck.  C2-3:  Minimal disc bulging is present.  There is no significant stenosis.  C3-4:  A leftward disc osteophyte complex effaces the ventral CSF. Mild left foraminal narrowing is secondary to disc and uncovertebral spurring.  C4-5:  A diffuse disc herniation is present.  There is superior and inferior extrusion of disc material extending 5 mm above and 6 mm below the disc level.  Moderate central canal stenosis is present. The canal is narrowed to 6 mm.  Moderate foraminal stenosis is worse on the left.  C5-6:  A broad-based disc herniation is present.  There is flattening of the ventral cord.  The canal is narrowed to 8 mm. Moderate to severe foraminal stenosis is present bilaterally.  C6-7:  No significant disc herniation or stenosis is present.  C7-T1:  No significant disc herniation or stenosis is present.  Diffuse dural enhancement is present throughout the cervical spine. There is enhancement  around what is presumed to be disc at C4-5 and at C5-6.  There is some enhancement within the disc space at C5-6. No definite marrow enhancement is present. There is some enhancement of the anterior paraspinous musculature on the left at to C3-4, C4-5, and C5-6.  IMPRESSION: 1.  Diffuse dural enhancement throughout the cervical spine.  This could be related to infection, potentially from the C5-6 disc space.  Some dural enhancement can be seen normally following lumbar puncture. 2.  Large disc herniation with superior and inferior exterior extrusion at C4-5.  Enhancement surrounds the disc without a definite abscess. 3.  Broad-based disc herniation with some internal enhancement of the disc at C5-6. Infection is not excluded. 4.  Left paraspinous muscle enhancement also raises suspicion for infection. 5.  Disc bulging at C3-4 as well as uncovertebral spurring leads to mild left foraminal narrowing. 5.  Moderate central canal stenosis at C4-5 greater than C5-6. 6.  Moderate foraminal stenosis bilaterally at C4-5. 7.  Moderate to severe foraminal stenosis bilaterally at C5-6.  MRI THORACIC SPINE  Findings: The thoracic spinal cord is within normal limits.  The conus medullaris appears to be terminating at L1 but is incompletely imaged.  The postcontrast images demonstrate some posterior dural enhancement  to the level of T4.  There is mild dural enhancement at this T11 and T12 as well.  There is no abscess.  A superior endplate Schmorl's node and remote compression fracture is present at T12.  There is focal thickening of the ligamentum flavum on the left at T10-11 with slight encroachment on the CSF but no significant stenosis.  The foramina are patent bilaterally.  The postcontrast images are otherwise unremarkable.  Mild dependent atelectasis is present in the lungs bilaterally. The patient is intubated.  An NG tube is in place.  The soft tissues are otherwise unremarkable.  IMPRESSION:  1.  Mild dural enhancement  without evidence for an abscess.  This is likely related to dural infection.  Such findings could be seen following recent lumbar puncture. 2.  Minimal posterior encroachment by thickening of the ligamentum flavum on the left at T10-11. Original Report Authenticated By: Jamesetta Orleans. MATTERN, M.D.   Mr Cervical Spine W Wo Contrast  10/19/2011  **ADDENDUM** CREATED: 10/19/2011 17:45:12  These results were called by telephone on 10/19/2011  at  05:45 p.m. to  Judeth Cornfield, RN, ICU, who verbally acknowledged these results.  **END ADDENDUM** SIGNED BY: Chauncey Fischer, M.D.   10/19/2011  *RADIOLOGY REPORT*  Clinical Data:  Left-sided weakness.  Status post fall lethargy. Evaluate for epidural abscess.  MRI HEAD WITHOUT AND WITH CONTRAST MRI CERVICAL AND THORACIC SPINE WITHOUT AND WITH CONTRAST  Technique:  Multiplanar, multiecho pulse sequences of the brain and surrounding structures, the cervical spine, to include the craniocervical junction and cervicothoracic junction, and the thoracic spine, were obtained without and with intravenous contrast.  Contrast: 20mL MULTIHANCE GADOBENATE DIMEGLUMINE 529 MG/ML IV SOLN  Comparison:  CT head without contrast is 10/19/2011.  MRI HEAD  Findings:  The diffusion weighted images demonstrate no evidence for acute or subacute infarction.  Scattered periventricular and subcortical T2 and FLAIR hyperintensities are greater than expected for age.  There is abnormal signal in the CSF on the FLAIR images. No pathologic enhancement is evident on the postcontrast images.  Flow is present in the major intracranial arteries.  The globes orbits are intact.  A fluid level is present in the right maxillary sinus.  The paranasal sinuses are otherwise clear.  Minimal fluid is present in the right mastoid air cells.  There is fluid in the nasopharynx.  The patient is intubated.  IMPRESSION:  1.  Incomplete CSF saturation over the convexities bilaterally. This may be related to the patient's  recent lumbar puncture or possibly due to oxygenation.  Abnormal CSF is also considered. Elevated white cells were noted on the recent lumbar puncture. 2.  Scattered periventricular and subcortical white matter disease is advanced for age. The finding is nonspecific but can be seen in the setting of chronic microvascular ischemia, a demyelinating process such as multiple sclerosis, vasculitis, complicated migraine headaches, or as the sequelae of a prior infectious or inflammatory process. 3.  Mild sinus disease is potentially related to intubation.  MRI CERVICAL SPINE  Findings:  Normal signal is present in the cervical spine.  The craniocervical junction is within normal limits.  Flow is present in the major vascular structures of the neck.  C2-3:  Minimal disc bulging is present.  There is no significant stenosis.  C3-4:  A leftward disc osteophyte complex effaces the ventral CSF. Mild left foraminal narrowing is secondary to disc and uncovertebral spurring.  C4-5:  A diffuse disc herniation is present.  There is superior and inferior extrusion of  disc material extending 5 mm above and 6 mm below the disc level.  Moderate central canal stenosis is present. The canal is narrowed to 6 mm.  Moderate foraminal stenosis is worse on the left.  C5-6:  A broad-based disc herniation is present.  There is flattening of the ventral cord.  The canal is narrowed to 8 mm. Moderate to severe foraminal stenosis is present bilaterally.  C6-7:  No significant disc herniation or stenosis is present.  C7-T1:  No significant disc herniation or stenosis is present.  Diffuse dural enhancement is present throughout the cervical spine. There is enhancement around what is presumed to be disc at C4-5 and at C5-6.  There is some enhancement within the disc space at C5-6. No definite marrow enhancement is present. There is some enhancement of the anterior paraspinous musculature on the left at to C3-4, C4-5, and C5-6.  IMPRESSION: 1.   Diffuse dural enhancement throughout the cervical spine.  This could be related to infection, potentially from the C5-6 disc space.  Some dural enhancement can be seen normally following lumbar puncture. 2.  Large disc herniation with superior and inferior exterior extrusion at C4-5.  Enhancement surrounds the disc without a definite abscess. 3.  Broad-based disc herniation with some internal enhancement of the disc at C5-6. Infection is not excluded. 4.  Left paraspinous muscle enhancement also raises suspicion for infection. 5.  Disc bulging at C3-4 as well as uncovertebral spurring leads to mild left foraminal narrowing. 5.  Moderate central canal stenosis at C4-5 greater than C5-6. 6.  Moderate foraminal stenosis bilaterally at C4-5. 7.  Moderate to severe foraminal stenosis bilaterally at C5-6.  MRI THORACIC SPINE  Findings: The thoracic spinal cord is within normal limits.  The conus medullaris appears to be terminating at L1 but is incompletely imaged.  The postcontrast images demonstrate some posterior dural enhancement to the level of T4.  There is mild dural enhancement at this T11 and T12 as well.  There is no abscess.  A superior endplate Schmorl's node and remote compression fracture is present at T12.  There is focal thickening of the ligamentum flavum on the left at T10-11 with slight encroachment on the CSF but no significant stenosis.  The foramina are patent bilaterally.  The postcontrast images are otherwise unremarkable.  Mild dependent atelectasis is present in the lungs bilaterally. The patient is intubated.  An NG tube is in place.  The soft tissues are otherwise unremarkable.  IMPRESSION:  1.  Mild dural enhancement without evidence for an abscess.  This is likely related to dural infection.  Such findings could be seen following recent lumbar puncture. 2.  Minimal posterior encroachment by thickening of the ligamentum flavum on the left at T10-11. Original Report Authenticated By:  Jamesetta Orleans. MATTERN, M.D.   Mr Thoracic Spine W Wo Contrast  10/19/2011  **ADDENDUM** CREATED: 10/19/2011 17:45:12  These results were called by telephone on 10/19/2011  at  05:45 p.m. to  Judeth Cornfield, RN, ICU, who verbally acknowledged these results.  **END ADDENDUM** SIGNED BY: Chauncey Fischer, M.D.   10/19/2011  *RADIOLOGY REPORT*  Clinical Data:  Left-sided weakness.  Status post fall lethargy. Evaluate for epidural abscess.  MRI HEAD WITHOUT AND WITH CONTRAST MRI CERVICAL AND THORACIC SPINE WITHOUT AND WITH CONTRAST  Technique:  Multiplanar, multiecho pulse sequences of the brain and surrounding structures, the cervical spine, to include the craniocervical junction and cervicothoracic junction, and the thoracic spine, were obtained without and with intravenous contrast.  Contrast: 20mL MULTIHANCE GADOBENATE DIMEGLUMINE 529 MG/ML IV SOLN  Comparison:  CT head without contrast is 10/19/2011.  MRI HEAD  Findings:  The diffusion weighted images demonstrate no evidence for acute or subacute infarction.  Scattered periventricular and subcortical T2 and FLAIR hyperintensities are greater than expected for age.  There is abnormal signal in the CSF on the FLAIR images. No pathologic enhancement is evident on the postcontrast images.  Flow is present in the major intracranial arteries.  The globes orbits are intact.  A fluid level is present in the right maxillary sinus.  The paranasal sinuses are otherwise clear.  Minimal fluid is present in the right mastoid air cells.  There is fluid in the nasopharynx.  The patient is intubated.  IMPRESSION:  1.  Incomplete CSF saturation over the convexities bilaterally. This may be related to the patient's recent lumbar puncture or possibly due to oxygenation.  Abnormal CSF is also considered. Elevated white cells were noted on the recent lumbar puncture. 2.  Scattered periventricular and subcortical white matter disease is advanced for age. The finding is nonspecific but  can be seen in the setting of chronic microvascular ischemia, a demyelinating process such as multiple sclerosis, vasculitis, complicated migraine headaches, or as the sequelae of a prior infectious or inflammatory process. 3.  Mild sinus disease is potentially related to intubation.  MRI CERVICAL SPINE  Findings:  Normal signal is present in the cervical spine.  The craniocervical junction is within normal limits.  Flow is present in the major vascular structures of the neck.  C2-3:  Minimal disc bulging is present.  There is no significant stenosis.  C3-4:  A leftward disc osteophyte complex effaces the ventral CSF. Mild left foraminal narrowing is secondary to disc and uncovertebral spurring.  C4-5:  A diffuse disc herniation is present.  There is superior and inferior extrusion of disc material extending 5 mm above and 6 mm below the disc level.  Moderate central canal stenosis is present. The canal is narrowed to 6 mm.  Moderate foraminal stenosis is worse on the left.  C5-6:  A broad-based disc herniation is present.  There is flattening of the ventral cord.  The canal is narrowed to 8 mm. Moderate to severe foraminal stenosis is present bilaterally.  C6-7:  No significant disc herniation or stenosis is present.  C7-T1:  No significant disc herniation or stenosis is present.  Diffuse dural enhancement is present throughout the cervical spine. There is enhancement around what is presumed to be disc at C4-5 and at C5-6.  There is some enhancement within the disc space at C5-6. No definite marrow enhancement is present. There is some enhancement of the anterior paraspinous musculature on the left at to C3-4, C4-5, and C5-6.  IMPRESSION: 1.  Diffuse dural enhancement throughout the cervical spine.  This could be related to infection, potentially from the C5-6 disc space.  Some dural enhancement can be seen normally following lumbar puncture. 2.  Large disc herniation with superior and inferior exterior extrusion  at C4-5.  Enhancement surrounds the disc without a definite abscess. 3.  Broad-based disc herniation with some internal enhancement of the disc at C5-6. Infection is not excluded. 4.  Left paraspinous muscle enhancement also raises suspicion for infection. 5.  Disc bulging at C3-4 as well as uncovertebral spurring leads to mild left foraminal narrowing. 5.  Moderate central canal stenosis at C4-5 greater than C5-6. 6.  Moderate foraminal stenosis bilaterally at C4-5. 7.  Moderate to severe  foraminal stenosis bilaterally at C5-6.  MRI THORACIC SPINE  Findings: The thoracic spinal cord is within normal limits.  The conus medullaris appears to be terminating at L1 but is incompletely imaged.  The postcontrast images demonstrate some posterior dural enhancement to the level of T4.  There is mild dural enhancement at this T11 and T12 as well.  There is no abscess.  A superior endplate Schmorl's node and remote compression fracture is present at T12.  There is focal thickening of the ligamentum flavum on the left at T10-11 with slight encroachment on the CSF but no significant stenosis.  The foramina are patent bilaterally.  The postcontrast images are otherwise unremarkable.  Mild dependent atelectasis is present in the lungs bilaterally. The patient is intubated.  An NG tube is in place.  The soft tissues are otherwise unremarkable.  IMPRESSION:  1.  Mild dural enhancement without evidence for an abscess.  This is likely related to dural infection.  Such findings could be seen following recent lumbar puncture. 2.  Minimal posterior encroachment by thickening of the ligamentum flavum on the left at T10-11. Original Report Authenticated By: Jamesetta Orleans. MATTERN, M.D.   Dg Chest Port 1 View  10/20/2011  *RADIOLOGY REPORT*  Clinical Data: Evaluate endotracheal tube position  PORTABLE CHEST - 1 VIEW  Comparison: 10/17/1936  Findings: Endotracheal tube terminates 4.0 cm above carina. Nasogastric tube extends beyond the   inferior aspect of the film. Mild cardiomegaly.  Right hemidiaphragm elevation is mild. No pleural effusion or pneumothorax.  No congestive failure.  Minimal right base subsegmental atelectasis.  Improved left base aeration.  IMPRESSION: Improved bibasilar atelectasis. No acute findings.  Original Report Authenticated By: Consuello Bossier, M.D.   Dg Chest Port 1 View  10/18/2011  *RADIOLOGY REPORT*  Clinical Data: Evaluate endotracheal tube.  Hypertension.  PORTABLE CHEST - 1 VIEW  Comparison: 10/18/2011 at the 1953 hours  Findings: Interval placement of endotracheal tube with tip about 4.8 cm above the carina.  An enteric tube has been placed.  The tip is not visualized but is below the left hemidiaphragm consistent with location at least in the stomach.  Shallow inspiration.  Mild cardiac enlargement with suggestion of mild pulmonary vascular congestion.  Linear atelectasis in the lung bases.  No pneumothorax.  No blunting of costophrenic angles.  IMPRESSION: Endotracheal tube placed with tip about 4.8 cm above the carina.  Original Report Authenticated By: Marlon Pel, M.D.   Dg Chest Port 1 View  10/18/2011  *RADIOLOGY REPORT*  Clinical Data: 61 year old male with hypertension and altered mental status.  PORTABLE CHEST - 1 VIEW  Comparison: None.  Findings: Upright AP view 1953 hours.  Low lung volumes.  Right greater than left streaky bibasilar opacity.  No pneumothorax, pulmonary edema or definite effusion. There is cardiomegaly.  Other mediastinal contours are within normal limits.  Visualized tracheal air column is within normal limits.  IMPRESSION: Low lung volumes with right greater than left basilar opacity. Favor atelectasis but lung base infection cannot be excluded.  Original Report Authenticated By: Harley Hallmark, M.D.    Assessment/Plan: Doing well post surgery. Gram stain from epidural space shows some gm positive cocci, so suspect some diskitis and epidural extension. Will need long  term antibiotics.  LOS: 2 days  As above   Reinaldo Meeker, MD 10/20/2011, 8:41 AM

## 2011-10-20 NOTE — Progress Notes (Signed)
Orthopedic Tech Progress Note Patient Details:  Charles Ball [birth year] [id number]  Ortho Devices Type of Ortho Device: Soft collar Ortho Device/Splint Interventions: Application   Dedric Ethington T 10/20/2011, 10:39 AM

## 2011-10-20 NOTE — Progress Notes (Signed)
Pt told me that oxycodone 5/325 was not "going to touch me". I asked him why he says this. He stated that he takes Roxycodone 30mg  q 4 hr. At home. I explained that it is important to be honest regarding home meds so that we can adequately treat his acute pain in hospital. I confirmed with [facility] that pt does take Roxycodone 30mg  q 4-6hr. I called the "box" and notified Dr. Delford Field, and I called Dr Dutch Quint in [facility] to notify him as well. Orders received.

## 2011-10-20 NOTE — Procedures (Signed)
Extubation Procedure Note  Patient Details:   Name: Charles Ball DOB: 10-13-1950 MRN: [id number]   Airway Documentation:  Patient extubated per MD order and placed on 2L [place], sats 95%.  Pt has a strong productive cough, and is able to verbalize post extubation.  RT will monitor.   Evaluation  O2 sats: stable throughout Complications: No apparent complications Patient did tolerate procedure well. Bilateral Breath Sounds: Diminished;Rhonchi Suctioning: Airway Yes  [place] 10/20/2011, 8:58 AM

## 2011-10-20 NOTE — Progress Notes (Signed)
Pt stated he is still 10/10 pain after fentanyl. He also stated that he takes 1mg  Xanax TID. I verified this with [facility] at [phone]. Dr Vassie Loll notified, orders received.

## 2011-10-21 ENCOUNTER — Inpatient Hospital Stay (HOSPITAL_COMMUNITY): Payer: Medicaid Other

## 2011-10-21 ENCOUNTER — Encounter (HOSPITAL_COMMUNITY): Payer: Self-pay | Admitting: Pulmonary Disease

## 2011-10-21 DIAGNOSIS — R739 Hyperglycemia, unspecified: Secondary | ICD-10-CM | POA: Diagnosis present

## 2011-10-21 DIAGNOSIS — D696 Thrombocytopenia, unspecified: Secondary | ICD-10-CM | POA: Diagnosis present

## 2011-10-21 DIAGNOSIS — R7881 Bacteremia: Secondary | ICD-10-CM | POA: Diagnosis present

## 2011-10-21 DIAGNOSIS — G062 Extradural and subdural abscess, unspecified: Secondary | ICD-10-CM | POA: Diagnosis present

## 2011-10-21 LAB — CULTURE, BLOOD (ROUTINE X 2): Culture  Setup Time: 201306020301

## 2011-10-21 LAB — GLUCOSE, CAPILLARY
Glucose-Capillary: 161 mg/dL — ABNORMAL HIGH (ref 70–99)
Glucose-Capillary: 118 mg/dL — ABNORMAL HIGH (ref 70–99)
Glucose-Capillary: 173 mg/dL — ABNORMAL HIGH (ref 70–99)

## 2011-10-21 LAB — CBC
Hemoglobin: 12.2 g/dL — ABNORMAL LOW (ref 13.0–17.0)
Platelets: 112 10*3/uL — ABNORMAL LOW (ref 150–400)
RBC: 4.14 MIL/uL — ABNORMAL LOW (ref 4.22–5.81)
WBC: 10.8 10*3/uL — ABNORMAL HIGH (ref 4.0–10.5)

## 2011-10-21 LAB — BASIC METABOLIC PANEL
Calcium: 7.3 mg/dL — ABNORMAL LOW (ref 8.4–10.5)
GFR calc non Af Amer: >90 mL/min (ref >90)
Glucose, Bld: 162 mg/dL — ABNORMAL HIGH (ref 70–99)
Potassium: 4 mEq/L (ref 3.5–5.1)
Sodium: 139 mEq/L (ref 135–145)

## 2011-10-21 MED ORDER — INSULIN ASPART 100 UNIT/ML ~~LOC~~ SOLN
4.0000 [IU] | Freq: Three times a day (TID) | SUBCUTANEOUS | Status: DC
  Administered 2011-10-21 – 2011-10-31 (×20): 4 [IU] via SUBCUTANEOUS

## 2011-10-21 MED ORDER — INSULIN GLARGINE 100 UNIT/ML ~~LOC~~ SOLN
5.0000 [IU] | Freq: Every day | SUBCUTANEOUS | Status: DC
  Administered 2011-10-21 – 2011-10-30 (×10): 5 [IU] via SUBCUTANEOUS

## 2011-10-21 MED ORDER — CEFAZOLIN SODIUM 1-5 GM-% IV SOLN
1.0000 g | Freq: Three times a day (TID) | INTRAVENOUS | Status: DC
  Administered 2011-10-21 – 2011-10-23 (×6): 1 g via INTRAVENOUS
  Filled 2011-10-21 (×9): qty 50

## 2011-10-21 MED ORDER — INSULIN ASPART 100 UNIT/ML ~~LOC~~ SOLN: 0.0000 [IU] | Freq: Every day | SUBCUTANEOUS | Status: DC

## 2011-10-21 MED ORDER — FENTANYL CITRATE 0.05 MG/ML IJ SOLN
25.0000 ug | INTRAMUSCULAR | Status: DC | PRN
  Administered 2011-10-21 – 2011-10-24 (×10): 50 ug via INTRAVENOUS
  Administered 2011-10-24: 25 ug via INTRAVENOUS
  Administered 2011-10-24 – 2011-10-25 (×2): 50 ug via INTRAVENOUS
  Administered 2011-10-25: 25 ug via INTRAVENOUS
  Administered 2011-10-25 – 2011-10-26 (×3): 50 ug via INTRAVENOUS
  Administered 2011-10-26: 25 ug via INTRAVENOUS
  Administered 2011-10-26: 50 ug via INTRAVENOUS
  Administered 2011-10-26: 25 ug via INTRAVENOUS
  Administered 2011-10-26: 50 ug via INTRAVENOUS
  Administered 2011-10-27 (×2): 25 ug via INTRAVENOUS
  Administered 2011-10-27: 50 ug via INTRAVENOUS
  Filled 2011-10-21 (×24): qty 2

## 2011-10-21 MED ORDER — INSULIN ASPART 100 UNIT/ML ~~LOC~~ SOLN
0.0000 [IU] | Freq: Three times a day (TID) | SUBCUTANEOUS | Status: DC
  Administered 2011-10-21 – 2011-10-22 (×4): 3 [IU] via SUBCUTANEOUS
  Administered 2011-10-22 – 2011-10-23 (×3): 2 [IU] via SUBCUTANEOUS

## 2011-10-21 MED ORDER — ALPRAZOLAM 0.5 MG PO TABS: 1.0000 mg | ORAL_TABLET | Freq: Three times a day (TID) | ORAL | Status: DC

## 2011-10-21 MED ORDER — ENSURE COMPLETE PO LIQD
237.0000 mL | Freq: Two times a day (BID) | ORAL | Status: DC
  Administered 2011-10-25: 237 mL via ORAL

## 2011-10-21 NOTE — Progress Notes (Signed)
Nutrition Follow-up  Diet Order:  Full Liquids Per RN she felt pt was ready for diet advancement last night but pt only wanted Jell-O. Spoke with pt who states that he wants only jell-O and ice cream. Pt denies hx of DM. DM coordinator recommends hgbA1C level for possible dx.  Pt with surgery on 6/2 for herniated disc with spinal cord compression. Pt continues to have Left hemiparesis after surgical decompression.  Pt reports following regular diet at home. Usually skips Breakfast and typically eats hamburgers for lunch/dinner. Reports living with girlfriend and she does most of the cooking.   Meds: Scheduled Meds:   . ALPRAZolam  1 mg Oral TID  . antiseptic oral rinse  15 mL Mouth Rinse QID  .  ceFAZolin (ANCEF) IV  1 g Intravenous Q8H  . chlorhexidine  15 mL Mouth Rinse BID  . fentaNYL      . insulin aspart  0-15 Units Subcutaneous TID WC  . insulin aspart  0-5 Units Subcutaneous QHS  . insulin aspart  4 Units Subcutaneous TID WC  . insulin glargine  5 Units Subcutaneous QHS  . sodium chloride  3 mL Intravenous Q12H  . DISCONTD: acyclovir  10 mg/kg (Adjusted) Intravenous Q8H  . DISCONTD: ampicillin (OMNIPEN) IV  2 g Intravenous Q4H  . DISCONTD: cefTRIAXone (ROCEPHIN)  IV  2 g Intravenous Q12H  . DISCONTD: dexamethasone  4 mg Intravenous Q6H  . DISCONTD: dexamethasone  4 mg Oral Q6H  . DISCONTD: insulin aspart  0-15 Units Subcutaneous Q4H  . DISCONTD: insulin glargine  5 Units Subcutaneous QHS  . DISCONTD: pantoprazole sodium  40 mg Per Tube Q1200  . DISCONTD: vancomycin  1,250 mg Intravenous Q12H   Continuous Infusions:   . sodium chloride    . DISCONTD: sodium chloride Stopped (10/21/11 1045)   PRN Meds:.sodium chloride, acetaminophen, acetaminophen, fentaNYL, menthol-cetylpyridinium, ondansetron (ZOFRAN) IV, oxyCODONE, phenol, sodium chloride, DISCONTD: fentaNYL, DISCONTD: oxyCODONE-acetaminophen  Labs:  CMP     Component Value Date/Time   NA 139 10/21/2011 0350   K 4.0  10/21/2011 0350   CL 106 10/21/2011 0350   CO2 24 10/21/2011 0350   GLUCOSE 162* 10/21/2011 0350   BUN 30* 10/21/2011 0350   CREATININE 0.78 10/21/2011 0350   CALCIUM 7.3* 10/21/2011 0350   PROT 5.8* 10/20/2011 0125   ALBUMIN 2.1* 10/20/2011 0125   AST 47* 10/20/2011 0125   ALT 28 10/20/2011 0125   ALKPHOS 50 10/20/2011 0125   BILITOT 0.4 10/20/2011 0125   GFRNONAA >90 10/21/2011 0350   GFRAA >90 10/21/2011 0350   CBG (last 3)   Basename 10/21/11 1116 10/21/11 0754 10/21/11 0426  GLUCAP 166* 156* 161*    Intake/Output Summary (Last 24 hours) at 10/21/11 1210 Last data filed at 10/21/11 1045  Gross per 24 hour  Intake 3449.85 ml  Output   1245 ml  Net 2204.85 ml    Weight Status:  Pt reports usual wt is 237 lbs 246  Lbs 6/2  Re-estimated needs:  2100 - 2300 kcal; 110-125 grams; > 2.5 L/day  Nutrition Dx:  Inadequate oral intake now r/t decreased appetite AEB limited intake at meals.  Goal:  Pt will consume >/= 90% estimated needs; not met.   Intervention:    Offer ensure complete bid  Encourage po intake  Monitor:  Po intake, diet tolerance  Kendell Bane Cornelison Pager #:  [phone]

## 2011-10-21 NOTE — Progress Notes (Signed)
INFECTIOUS DISEASE PROGRESS NOTE  ID: Charles Ball is a 61 y.o. male with   Active Problems:  Acute respiratory failure  Cervical (neck) region somatic dysfunction  Subjective: C/o neck pain.  Abtx:  Anti-infectives     Start     Dose/Rate Route Frequency Ordered Stop   10/19/11 2007   bacitracin 16109 UNITS injection     Comments: [name], [name]: cabinet override         10/19/11 2007 10/20/11 0814   10/19/11 2005   bacitracin 50,000 Units in sodium chloride irrigation 0.9 % 500 mL irrigation  Status:  Discontinued          As needed 10/19/11 2058 10/19/11 2209   10/19/11 1200   vancomycin (VANCOCIN) 1,250 mg in sodium chloride 0.9 % 250 mL IVPB        1,250 mg 166.7 mL/hr over 90 Minutes Intravenous Every 12 hours 10/18/11 2157     10/19/11 0200   acyclovir (ZOVIRAX) 930 mg in dextrose 5 % 150 mL IVPB  Status:  Discontinued        10 mg/kg  92.8 kg (Adjusted) 168.6 mL/hr over 60 Minutes Intravenous 3 times per day 10/19/11 0156 10/20/11 1607   10/18/11 2230   ampicillin (OMNIPEN) 2 g in sodium chloride 0.9 % 50 mL IVPB  Status:  Discontinued        2 g 150 mL/hr over 20 Minutes Intravenous Every 4 hours 10/18/11 2130 10/20/11 1607   10/18/11 2200   cefTRIAXone (ROCEPHIN) 2 g in dextrose 5 % 50 mL IVPB  Status:  Discontinued        2 g 100 mL/hr over 30 Minutes Intravenous Every 12 hours 10/18/11 2130 10/20/11 1607   10/18/11 2200   vancomycin (VANCOCIN) IVPB 1000 mg/200 mL premix        1,000 mg 200 mL/hr over 60 Minutes Intravenous  Once 10/18/11 2156 10/19/11 0258   10/18/11 2045   piperacillin-tazobactam (ZOSYN) IVPB 3.375 g  Status:  Discontinued        3.375 g 100 mL/hr over 30 Minutes Intravenous  Once 10/18/11 2029 10/18/11 2204   10/18/11 2045   vancomycin (VANCOCIN) IVPB 1000 mg/200 mL premix        1,000 mg 200 mL/hr over 60 Minutes Intravenous  Once 10/18/11 2029 10/18/11 2235          Medications:  Scheduled:   . ALPRAZolam  1 mg Oral TID    . antiseptic oral rinse  15 mL Mouth Rinse QID  . chlorhexidine  15 mL Mouth Rinse BID  . fentaNYL      . insulin aspart  0-15 Units Subcutaneous Q4H  . insulin glargine  5 Units Subcutaneous QHS  . pantoprazole sodium  40 mg Per Tube Q1200  . sodium chloride  3 mL Intravenous Q12H  . vancomycin  1,250 mg Intravenous Q12H  . DISCONTD: acyclovir  10 mg/kg (Adjusted) Intravenous Q8H  . DISCONTD: ampicillin (OMNIPEN) IV  2 g Intravenous Q4H  . DISCONTD: cefTRIAXone (ROCEPHIN)  IV  2 g Intravenous Q12H  . DISCONTD: dexamethasone  4 mg Intravenous Q6H  . DISCONTD: dexamethasone  4 mg Oral Q6H    Objective: Vital signs in last 24 hours: Temp:  [97.8 F (36.6 C)-99.5 F (37.5 C)] 98.5 F (36.9 C) (06/04 0800) Pulse Rate:  [63-94] 69  (06/04 0800) Resp:  [8-25] 14  (06/04 0800) BP: (119-159)/(78-105) 136/89 mmHg (06/04 0800) SpO2:  [92 %-96 %] 93 % (06/04 0800)  General appearance: alert, cooperative and no distress Neck: wound clean Resp: clear to auscultation bilaterally Cardio: regular rate and rhythm GI: normal findings: bowel sounds normal and soft, non-tender Neurologic: Motor: unchanged.   Lab Results  Basename 10/21/11 0350 10/20/11 0400 10/20/11 0125  WBC 10.8* 12.2* --  HGB 12.2* 12.4* --  HCT 35.2* 35.4* --  NA 139 -- 136  K 4.0 -- 3.7  CL 106 -- 104  CO2 24 -- 22  BUN 30* -- 24*  CREATININE 0.78 -- 0.72  GLU -- -- --   Liver Panel  Basename 10/20/11 0125 10/18/11 1910  PROT 5.8* 8.0  ALBUMIN 2.1* 3.3*  AST 47* 53*  ALT 28 33  ALKPHOS 50 75  BILITOT 0.4 1.1  BILIDIR -- --  IBILI -- --   Sedimentation Rate No results found for this basename: ESRSEDRATE in the last 72 hours C-Reactive Protein No results found for this basename: CRP:2 in the last 72 hours  Microbiology: Recent Results (from the past 240 hour(s))  CULTURE, BLOOD (ROUTINE X 2)     Status: Normal   Collection Time   10/18/11  6:55 PM      Component Value Range Status Comment    Specimen Description BLOOD RIGHT ARM  5 ML IN [facility] BOTTLE   Final    Special Requests NONE   Final    Culture  Setup Time [id number]   Final    Culture     Final    Value: STAPHYLOCOCCUS AUREUS     Note: RIFAMPIN AND GENTAMICIN SHOULD NOT BE USED AS SINGLE DRUGS FOR TREATMENT OF STAPH INFECTIONS.     Note: Gram Stain Report Called to,Read Back By and Verified With: RN S. DILLON ON 10/19/11 AT 1500 BY DTERRY   Report Status 10/21/2011 FINAL   Final    Organism ID, Bacteria STAPHYLOCOCCUS AUREUS   Final   URINE CULTURE     Status: Normal   Collection Time   10/18/11  8:14 PM      Component Value Range Status Comment   Specimen Description URINE, CATHETERIZED   Final    Special Requests NONE   Final    Culture  Setup Time [id number]   Final    Colony Count NO GROWTH   Final    Culture NO GROWTH   Final    Report Status 10/20/2011 FINAL   Final   CULTURE, BLOOD (ROUTINE X 2)     Status: Normal   Collection Time   10/18/11  9:15 PM      Component Value Range Status Comment   Specimen Description BLOOD LEFT HAND   Final    Special Requests BOTTLES DRAWN AEROBIC AND ANAEROBIC 4CC   Final    Culture  Setup Time [id number]   Final    Culture     Final    Value: STAPHYLOCOCCUS AUREUS     Note: SUSCEPTIBILITIES PERFORMED ON PREVIOUS CULTURE WITHIN THE LAST 5 DAYS.     Note: Gram Stain Report Called to,Read Back By and Verified With: RN S. DILLON ON 10/19/11 AT 1840 BY DTERRY   Report Status 10/21/2011 FINAL   Final   CSF CULTURE     Status: Normal (Preliminary result)   Collection Time   10/18/11 11:45 PM      Component Value Range Status Comment   Specimen Description CSF   Final    Special Requests Normal   Final    Gram Stain     Final  Value: CYTOSPIN SLIDE WBC PRESENT,BOTH PMN AND MONONUCLEAR     NO ORGANISMS SEEN     Gram Stain Report Called to,Read Back By and Verified With: Gram Stain Report Called to,Read Back By and Verified With: TWOODS RN AT 0141 ON [id number] BY DLONG Performed by  [facility]   Culture NO GROWTH 1 DAY   Final    Report Status PENDING   Incomplete   GRAM STAIN     Status: Normal   Collection Time   10/18/11 11:45 PM      Component Value Range Status Comment   Specimen Description CSF   Final    Special Requests Normal   Final    Gram Stain     Final    Value: WBC PRESENT,BOTH PMN AND MONONUCLEAR     NO ORGANISMS SEEN     CYTO SPUN SLIDE     Gram Stain Report Called to,Read Back By and Verified With: TWOODS RN AT 0141 ON [id number] BY DLONG   Report Status 10/19/2011 FINAL   Final   MRSA PCR SCREENING     Status: Normal   Collection Time   10/19/11 12:23 AM      Component Value Range Status Comment   MRSA by PCR NEGATIVE  NEGATIVE  Final   WOUND CULTURE     Status: Normal (Preliminary result)   Collection Time   10/19/11  8:29 PM      Component Value Range Status Comment   Specimen Description WOUND NECK CERVICAL FOUR FIVE DISC SPACE   Final    Special Requests NONE   Final    Gram Stain     Final    Value: RARE WBC PRESENT, PREDOMINANTLY MONONUCLEAR     NO ORGANISMS SEEN     Performed at [facility]   Culture PENDING   Incomplete    Report Status PENDING   Incomplete   AFB CULTURE WITH SMEAR     Status: Normal (Preliminary result)   Collection Time   10/19/11  8:29 PM      Component Value Range Status Comment   Specimen Description WOUND NECK CERVICAL FOUR FIVE DISC SPACE   Final    Special Requests NONE   Final    ACID FAST SMEAR NO ACID FAST BACILLI SEEN   Final    Culture     Final    Value: CULTURE WILL BE EXAMINED FOR 6 WEEKS BEFORE ISSUING A FINAL REPORT   Report Status PENDING   Incomplete   GRAM STAIN     Status: Normal   Collection Time   10/19/11  8:29 PM      Component Value Range Status Comment   Specimen Description WOUND NECK CERVICAL FOUR FIVE DISC SPACE   Final    Special Requests NONE   Final    Gram Stain     Final    Value: RARE WBC PRESENT, PREDOMINANTLY MONONUCLEAR     NO ORGANISMS SEEN   Report Status  10/19/2011 FINAL   Final   TISSUE CULTURE     Status: Normal (Preliminary result)   Collection Time   10/19/11  8:34 PM      Component Value Range Status Comment   Specimen Description TISSUE NECK CERVICAL FOUR FIVE DISC   Final    Special Requests NONE   Final    Gram Stain     Final    Value: NO WBC SEEN     NO ORGANISMS SEEN  Performed at [facility]   Culture PENDING   Incomplete    Report Status PENDING   Incomplete   AFB CULTURE WITH SMEAR     Status: Normal (Preliminary result)   Collection Time   10/19/11  8:34 PM      Component Value Range Status Comment   Specimen Description TISSUE NECK CERVICAL FOUR FIVE DISC   Final    Special Requests NONE   Final    ACID FAST SMEAR NO ACID FAST BACILLI SEEN   Final    Culture     Final    Value: CULTURE WILL BE EXAMINED FOR 6 WEEKS BEFORE ISSUING A FINAL REPORT   Report Status PENDING   Incomplete   GRAM STAIN     Status: Normal   Collection Time   10/19/11  8:34 PM      Component Value Range Status Comment   Specimen Description TISSUE NECK CERVICAL FOUR FIVE DISC   Final    Special Requests NONE   Final    Gram Stain     Final    Value: NO WBC SEEN     NO ORGANISMS SEEN   Report Status 10/19/2011 FINAL   Final   WOUND CULTURE     Status: Normal (Preliminary result)   Collection Time   10/19/11 10:14 PM      Component Value Range Status Comment   Specimen Description WOUND NECK CERVICAL FOUR FIVE EPIDURAL DISC SPACE   Final    Special Requests NONE   Final    Gram Stain     Final    Value: FEW WBC PRESENT,BOTH PMN AND MONONUCLEAR     RARE GRAM POSITIVE COCCI IN PAIRS     Performed at [facility]   Culture PENDING   Incomplete    Report Status PENDING   Incomplete   GRAM STAIN     Status: Normal   Collection Time   10/19/11 10:14 PM      Component Value Range Status Comment   Specimen Description NECK CERVICAL FOUR FIVE WPIDURAL DISC SPACE   Final    Special Requests NONE   Final    Gram Stain     Final     Value: FEW WBC PRESENT,BOTH PMN AND MONONUCLEAR     RARE GRAM POSITIVE COCCI IN PAIRS   Report Status 10/19/2011 FINAL   Final   AFB CULTURE WITH SMEAR     Status: Normal (Preliminary result)   Collection Time   10/19/11 10:14 PM      Component Value Range Status Comment   Specimen Description NECK CERVICAL FOUR FIVE WPIDURAL DISC SPACE   Final    Special Requests NONE   Final    ACID FAST SMEAR NO ACID FAST BACILLI SEEN   Final    Culture     Final    Value: CULTURE WILL BE EXAMINED FOR 6 WEEKS BEFORE ISSUING A FINAL REPORT   Report Status PENDING   Incomplete     Studies/Results: Dg Cervical Spine 2-3 Views  10/20/2011  *RADIOLOGY REPORT*  Clinical Data: Cervical fusion.  CERVICAL SPINE - 2-3 VIEW  Comparison: MRI cervical spine 10/19/2011.  Findings: Lateral cervical spine films demonstrate anterior plate and screws and interbody bone plug fusing C4-5 and C5-6.  No complicating features are demonstrated.  IMPRESSION: Anterior and interbody fusion changes at C4-5 and C5-6.  Original Report Authenticated By: P. Loralie Champagne, M.D.   Mr Lodema Pilot Contrast  10/19/2011  **  ADDENDUM** CREATED: 10/19/2011 17:45:12  These results were called by telephone on 10/19/2011  at  05:45 p.m. to  Judeth Cornfield, RN, ICU, who verbally acknowledged these results.  **END ADDENDUM** SIGNED BY: Chauncey Fischer, M.D.   10/19/2011  *RADIOLOGY REPORT*  Clinical Data:  Left-sided weakness.  Status post fall lethargy. Evaluate for epidural abscess.  MRI HEAD WITHOUT AND WITH CONTRAST MRI CERVICAL AND THORACIC SPINE WITHOUT AND WITH CONTRAST  Technique:  Multiplanar, multiecho pulse sequences of the brain and surrounding structures, the cervical spine, to include the craniocervical junction and cervicothoracic junction, and the thoracic spine, were obtained without and with intravenous contrast.  Contrast: 20mL MULTIHANCE GADOBENATE DIMEGLUMINE 529 MG/ML IV SOLN  Comparison:  CT head without contrast is 10/19/2011.  MRI  HEAD  Findings:  The diffusion weighted images demonstrate no evidence for acute or subacute infarction.  Scattered periventricular and subcortical T2 and FLAIR hyperintensities are greater than expected for age.  There is abnormal signal in the CSF on the FLAIR images. No pathologic enhancement is evident on the postcontrast images.  Flow is present in the major intracranial arteries.  The globes orbits are intact.  A fluid level is present in the right maxillary sinus.  The paranasal sinuses are otherwise clear.  Minimal fluid is present in the right mastoid air cells.  There is fluid in the nasopharynx.  The patient is intubated.  IMPRESSION:  1.  Incomplete CSF saturation over the convexities bilaterally. This may be related to the patient's recent lumbar puncture or possibly due to oxygenation.  Abnormal CSF is also considered. Elevated white cells were noted on the recent lumbar puncture. 2.  Scattered periventricular and subcortical white matter disease is advanced for age. The finding is nonspecific but can be seen in the setting of chronic microvascular ischemia, a demyelinating process such as multiple sclerosis, vasculitis, complicated migraine headaches, or as the sequelae of a prior infectious or inflammatory process. 3.  Mild sinus disease is potentially related to intubation.  MRI CERVICAL SPINE  Findings:  Normal signal is present in the cervical spine.  The craniocervical junction is within normal limits.  Flow is present in the major vascular structures of the neck.  C2-3:  Minimal disc bulging is present.  There is no significant stenosis.  C3-4:  A leftward disc osteophyte complex effaces the ventral CSF. Mild left foraminal narrowing is secondary to disc and uncovertebral spurring.  C4-5:  A diffuse disc herniation is present.  There is superior and inferior extrusion of disc material extending 5 mm above and 6 mm below the disc level.  Moderate central canal stenosis is present. The canal is  narrowed to 6 mm.  Moderate foraminal stenosis is worse on the left.  C5-6:  A broad-based disc herniation is present.  There is flattening of the ventral cord.  The canal is narrowed to 8 mm. Moderate to severe foraminal stenosis is present bilaterally.  C6-7:  No significant disc herniation or stenosis is present.  C7-T1:  No significant disc herniation or stenosis is present.  Diffuse dural enhancement is present throughout the cervical spine. There is enhancement around what is presumed to be disc at C4-5 and at C5-6.  There is some enhancement within the disc space at C5-6. No definite marrow enhancement is present. There is some enhancement of the anterior paraspinous musculature on the left at to C3-4, C4-5, and C5-6.  IMPRESSION: 1.  Diffuse dural enhancement throughout the cervical spine.  This could be related to  infection, potentially from the C5-6 disc space.  Some dural enhancement can be seen normally following lumbar puncture. 2.  Large disc herniation with superior and inferior exterior extrusion at C4-5.  Enhancement surrounds the disc without a definite abscess. 3.  Broad-based disc herniation with some internal enhancement of the disc at C5-6. Infection is not excluded. 4.  Left paraspinous muscle enhancement also raises suspicion for infection. 5.  Disc bulging at C3-4 as well as uncovertebral spurring leads to mild left foraminal narrowing. 5.  Moderate central canal stenosis at C4-5 greater than C5-6. 6.  Moderate foraminal stenosis bilaterally at C4-5. 7.  Moderate to severe foraminal stenosis bilaterally at C5-6.  MRI THORACIC SPINE  Findings: The thoracic spinal cord is within normal limits.  The conus medullaris appears to be terminating at L1 but is incompletely imaged.  The postcontrast images demonstrate some posterior dural enhancement to the level of T4.  There is mild dural enhancement at this T11 and T12 as well.  There is no abscess.  A superior endplate Schmorl's node and remote  compression fracture is present at T12.  There is focal thickening of the ligamentum flavum on the left at T10-11 with slight encroachment on the CSF but no significant stenosis.  The foramina are patent bilaterally.  The postcontrast images are otherwise unremarkable.  Mild dependent atelectasis is present in the lungs bilaterally. The patient is intubated.  An NG tube is in place.  The soft tissues are otherwise unremarkable.  IMPRESSION:  1.  Mild dural enhancement without evidence for an abscess.  This is likely related to dural infection.  Such findings could be seen following recent lumbar puncture. 2.  Minimal posterior encroachment by thickening of the ligamentum flavum on the left at T10-11. Original Report Authenticated By: Jamesetta Orleans. MATTERN, M.D.   Mr Cervical Spine W Wo Contrast  10/19/2011  **ADDENDUM** CREATED: 10/19/2011 17:45:12  These results were called by telephone on 10/19/2011  at  05:45 p.m. to  Judeth Cornfield, RN, ICU, who verbally acknowledged these results.  **END ADDENDUM** SIGNED BY: Chauncey Fischer, M.D.   10/19/2011  *RADIOLOGY REPORT*  Clinical Data:  Left-sided weakness.  Status post fall lethargy. Evaluate for epidural abscess.  MRI HEAD WITHOUT AND WITH CONTRAST MRI CERVICAL AND THORACIC SPINE WITHOUT AND WITH CONTRAST  Technique:  Multiplanar, multiecho pulse sequences of the brain and surrounding structures, the cervical spine, to include the craniocervical junction and cervicothoracic junction, and the thoracic spine, were obtained without and with intravenous contrast.  Contrast: 20mL MULTIHANCE GADOBENATE DIMEGLUMINE 529 MG/ML IV SOLN  Comparison:  CT head without contrast is 10/19/2011.  MRI HEAD  Findings:  The diffusion weighted images demonstrate no evidence for acute or subacute infarction.  Scattered periventricular and subcortical T2 and FLAIR hyperintensities are greater than expected for age.  There is abnormal signal in the CSF on the FLAIR images. No pathologic  enhancement is evident on the postcontrast images.  Flow is present in the major intracranial arteries.  The globes orbits are intact.  A fluid level is present in the right maxillary sinus.  The paranasal sinuses are otherwise clear.  Minimal fluid is present in the right mastoid air cells.  There is fluid in the nasopharynx.  The patient is intubated.  IMPRESSION:  1.  Incomplete CSF saturation over the convexities bilaterally. This may be related to the patient's recent lumbar puncture or possibly due to oxygenation.  Abnormal CSF is also considered. Elevated white cells were noted on the  recent lumbar puncture. 2.  Scattered periventricular and subcortical white matter disease is advanced for age. The finding is nonspecific but can be seen in the setting of chronic microvascular ischemia, a demyelinating process such as multiple sclerosis, vasculitis, complicated migraine headaches, or as the sequelae of a prior infectious or inflammatory process. 3.  Mild sinus disease is potentially related to intubation.  MRI CERVICAL SPINE  Findings:  Normal signal is present in the cervical spine.  The craniocervical junction is within normal limits.  Flow is present in the major vascular structures of the neck.  C2-3:  Minimal disc bulging is present.  There is no significant stenosis.  C3-4:  A leftward disc osteophyte complex effaces the ventral CSF. Mild left foraminal narrowing is secondary to disc and uncovertebral spurring.  C4-5:  A diffuse disc herniation is present.  There is superior and inferior extrusion of disc material extending 5 mm above and 6 mm below the disc level.  Moderate central canal stenosis is present. The canal is narrowed to 6 mm.  Moderate foraminal stenosis is worse on the left.  C5-6:  A broad-based disc herniation is present.  There is flattening of the ventral cord.  The canal is narrowed to 8 mm. Moderate to severe foraminal stenosis is present bilaterally.  C6-7:  No significant disc  herniation or stenosis is present.  C7-T1:  No significant disc herniation or stenosis is present.  Diffuse dural enhancement is present throughout the cervical spine. There is enhancement around what is presumed to be disc at C4-5 and at C5-6.  There is some enhancement within the disc space at C5-6. No definite marrow enhancement is present. There is some enhancement of the anterior paraspinous musculature on the left at to C3-4, C4-5, and C5-6.  IMPRESSION: 1.  Diffuse dural enhancement throughout the cervical spine.  This could be related to infection, potentially from the C5-6 disc space.  Some dural enhancement can be seen normally following lumbar puncture. 2.  Large disc herniation with superior and inferior exterior extrusion at C4-5.  Enhancement surrounds the disc without a definite abscess. 3.  Broad-based disc herniation with some internal enhancement of the disc at C5-6. Infection is not excluded. 4.  Left paraspinous muscle enhancement also raises suspicion for infection. 5.  Disc bulging at C3-4 as well as uncovertebral spurring leads to mild left foraminal narrowing. 5.  Moderate central canal stenosis at C4-5 greater than C5-6. 6.  Moderate foraminal stenosis bilaterally at C4-5. 7.  Moderate to severe foraminal stenosis bilaterally at C5-6.  MRI THORACIC SPINE  Findings: The thoracic spinal cord is within normal limits.  The conus medullaris appears to be terminating at L1 but is incompletely imaged.  The postcontrast images demonstrate some posterior dural enhancement to the level of T4.  There is mild dural enhancement at this T11 and T12 as well.  There is no abscess.  A superior endplate Schmorl's node and remote compression fracture is present at T12.  There is focal thickening of the ligamentum flavum on the left at T10-11 with slight encroachment on the CSF but no significant stenosis.  The foramina are patent bilaterally.  The postcontrast images are otherwise unremarkable.  Mild dependent  atelectasis is present in the lungs bilaterally. The patient is intubated.  An NG tube is in place.  The soft tissues are otherwise unremarkable.  IMPRESSION:  1.  Mild dural enhancement without evidence for an abscess.  This is likely related to dural infection.  Such findings  could be seen following recent lumbar puncture. 2.  Minimal posterior encroachment by thickening of the ligamentum flavum on the left at T10-11. Original Report Authenticated By: Jamesetta Orleans. MATTERN, M.D.   Mr Thoracic Spine W Wo Contrast  10/19/2011  **ADDENDUM** CREATED: 10/19/2011 17:45:12  These results were called by telephone on 10/19/2011  at  05:45 p.m. to  Judeth Cornfield, RN, ICU, who verbally acknowledged these results.  **END ADDENDUM** SIGNED BY: Chauncey Fischer, M.D.   10/19/2011  *RADIOLOGY REPORT*  Clinical Data:  Left-sided weakness.  Status post fall lethargy. Evaluate for epidural abscess.  MRI HEAD WITHOUT AND WITH CONTRAST MRI CERVICAL AND THORACIC SPINE WITHOUT AND WITH CONTRAST  Technique:  Multiplanar, multiecho pulse sequences of the brain and surrounding structures, the cervical spine, to include the craniocervical junction and cervicothoracic junction, and the thoracic spine, were obtained without and with intravenous contrast.  Contrast: 20mL MULTIHANCE GADOBENATE DIMEGLUMINE 529 MG/ML IV SOLN  Comparison:  CT head without contrast is 10/19/2011.  MRI HEAD  Findings:  The diffusion weighted images demonstrate no evidence for acute or subacute infarction.  Scattered periventricular and subcortical T2 and FLAIR hyperintensities are greater than expected for age.  There is abnormal signal in the CSF on the FLAIR images. No pathologic enhancement is evident on the postcontrast images.  Flow is present in the major intracranial arteries.  The globes orbits are intact.  A fluid level is present in the right maxillary sinus.  The paranasal sinuses are otherwise clear.  Minimal fluid is present in the right mastoid air  cells.  There is fluid in the nasopharynx.  The patient is intubated.  IMPRESSION:  1.  Incomplete CSF saturation over the convexities bilaterally. This may be related to the patient's recent lumbar puncture or possibly due to oxygenation.  Abnormal CSF is also considered. Elevated white cells were noted on the recent lumbar puncture. 2.  Scattered periventricular and subcortical white matter disease is advanced for age. The finding is nonspecific but can be seen in the setting of chronic microvascular ischemia, a demyelinating process such as multiple sclerosis, vasculitis, complicated migraine headaches, or as the sequelae of a prior infectious or inflammatory process. 3.  Mild sinus disease is potentially related to intubation.  MRI CERVICAL SPINE  Findings:  Normal signal is present in the cervical spine.  The craniocervical junction is within normal limits.  Flow is present in the major vascular structures of the neck.  C2-3:  Minimal disc bulging is present.  There is no significant stenosis.  C3-4:  A leftward disc osteophyte complex effaces the ventral CSF. Mild left foraminal narrowing is secondary to disc and uncovertebral spurring.  C4-5:  A diffuse disc herniation is present.  There is superior and inferior extrusion of disc material extending 5 mm above and 6 mm below the disc level.  Moderate central canal stenosis is present. The canal is narrowed to 6 mm.  Moderate foraminal stenosis is worse on the left.  C5-6:  A broad-based disc herniation is present.  There is flattening of the ventral cord.  The canal is narrowed to 8 mm. Moderate to severe foraminal stenosis is present bilaterally.  C6-7:  No significant disc herniation or stenosis is present.  C7-T1:  No significant disc herniation or stenosis is present.  Diffuse dural enhancement is present throughout the cervical spine. There is enhancement around what is presumed to be disc at C4-5 and at C5-6.  There is some enhancement within the disc  space at  C5-6. No definite marrow enhancement is present. There is some enhancement of the anterior paraspinous musculature on the left at to C3-4, C4-5, and C5-6.  IMPRESSION: 1.  Diffuse dural enhancement throughout the cervical spine.  This could be related to infection, potentially from the C5-6 disc space.  Some dural enhancement can be seen normally following lumbar puncture. 2.  Large disc herniation with superior and inferior exterior extrusion at C4-5.  Enhancement surrounds the disc without a definite abscess. 3.  Broad-based disc herniation with some internal enhancement of the disc at C5-6. Infection is not excluded. 4.  Left paraspinous muscle enhancement also raises suspicion for infection. 5.  Disc bulging at C3-4 as well as uncovertebral spurring leads to mild left foraminal narrowing. 5.  Moderate central canal stenosis at C4-5 greater than C5-6. 6.  Moderate foraminal stenosis bilaterally at C4-5. 7.  Moderate to severe foraminal stenosis bilaterally at C5-6.  MRI THORACIC SPINE  Findings: The thoracic spinal cord is within normal limits.  The conus medullaris appears to be terminating at L1 but is incompletely imaged.  The postcontrast images demonstrate some posterior dural enhancement to the level of T4.  There is mild dural enhancement at this T11 and T12 as well.  There is no abscess.  A superior endplate Schmorl's node and remote compression fracture is present at T12.  There is focal thickening of the ligamentum flavum on the left at T10-11 with slight encroachment on the CSF but no significant stenosis.  The foramina are patent bilaterally.  The postcontrast images are otherwise unremarkable.  Mild dependent atelectasis is present in the lungs bilaterally. The patient is intubated.  An NG tube is in place.  The soft tissues are otherwise unremarkable.  IMPRESSION:  1.  Mild dural enhancement without evidence for an abscess.  This is likely related to dural infection.  Such findings could be  seen following recent lumbar puncture. 2.  Minimal posterior encroachment by thickening of the ligamentum flavum on the left at T10-11. Original Report Authenticated By: Jamesetta Orleans. MATTERN, M.D.   Dg Chest Port 1 View  10/21/2011  *RADIOLOGY REPORT*  Clinical Data: Postop cervical spine surgery.  PORTABLE CHEST - 1 VIEW  Comparison: 10/20/2011.  Findings: The heart is mildly enlarged but stable.  The endotracheal tube and NG tubes have been removed.  Low lung volumes with increase and vascular crowding atelectasis.  Probable mild vascular congestion.  The  IMPRESSION:  1.  Low lung volumes post extubation with increase and vascular crowding and atelectasis. 2.  Mild vascular congestion.  Original Report Authenticated By: P. Loralie Champagne, M.D.   Dg Chest Port 1 View  10/20/2011  *RADIOLOGY REPORT*  Clinical Data: Evaluate endotracheal tube position  PORTABLE CHEST - 1 VIEW  Comparison: 10/17/1936  Findings: Endotracheal tube terminates 4.0 cm above carina. Nasogastric tube extends beyond the  inferior aspect of the film. Mild cardiomegaly.  Right hemidiaphragm elevation is mild. No pleural effusion or pneumothorax.  No congestive failure.  Minimal right base subsegmental atelectasis.  Improved left base aeration.  IMPRESSION: Improved bibasilar atelectasis. No acute findings.  Original Report Authenticated By: Consuello Bossier, M.D.     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

## 2011-10-21 NOTE — Progress Notes (Addendum)
UR completed.  Noted that neurosurgeon recommending aggressive rehab at d/c. Pt already has PT eval ordered but to be considered for CIR, will also need OT eval.

## 2011-10-21 NOTE — Progress Notes (Signed)
Name: Charles Ball MRN: [id number] DOB: [birth year]    LOS: 3  PULMONARY / CRITICAL CARE MEDICINE FOLLOW UP NOTE  HPI: 61 year old male admitted initially to [facility] 6/1 for presumed meningitis. He was found down with decreased movement of all four extremities. An MRI of the cervical spine showed C4-C5 and C5-C6 herniation and was transferred emergently to [facility] 6/2 for surgical decompression. Of note his blood cultures grew GPC 4/4. At the time of my examination the patient is sedated with propofol and fentanyl and on mechanical ventilation.   Current Status: RASS 0. CAM-ICU negative. No distress. No new complaints   Vital Signs: Temp:  [97.8 F (36.6 C)-99.1 F (37.3 C)] 98 F (36.7 C) (06/04 1800) Pulse Rate:  [63-89] 72  (06/04 1800) Resp:  [8-20] 20  (06/04 1800) BP: (119-170)/(71-95) 148/88 mmHg (06/04 1800) SpO2:  [92 %-98 %] 98 % (06/04 1800)  Physical Examination: General:  NAD Neuro:  L hemiparesis HEENT:  PERRL, pink conjunctivae, moist membranes Neck:  Supple, no JVD. ACDF scar noted Cardiovascular:  RRR, no M/R/G Lungs:  Clear Abdomen:  Soft, nontender, nondistended, bowel sounds present Ext: no edema  CXR : 6/4 NAD  BMET    Component Value Date/Time   NA 139 10/21/2011 0350   K 4.0 10/21/2011 0350   CL 106 10/21/2011 0350   CO2 24 10/21/2011 0350   GLUCOSE 162* 10/21/2011 0350   BUN 30* 10/21/2011 0350   CREATININE 0.78 10/21/2011 0350   CALCIUM 7.3* 10/21/2011 0350   GFRNONAA >90 10/21/2011 0350   GFRAA >90 10/21/2011 0350    CBC    Component Value Date/Time   WBC 10.8* 10/21/2011 0350   RBC 4.14* 10/21/2011 0350   HGB 12.2* 10/21/2011 0350   HCT 35.2* 10/21/2011 0350   PLT 112* 10/21/2011 0350   MCV 85.0 10/21/2011 0350   MCH 29.5 10/21/2011 0350   MCHC 34.7 10/21/2011 0350   RDW 13.2 10/21/2011 0350   LYMPHSABS 0.5* 10/19/2011 1006   MONOABS 0.4 10/19/2011 1006   EOSABS 0.0 10/19/2011 1006   BASOSABS 0.0 10/19/2011 1006      ASSESSMENT AND PLAN  C4-C5, C5-C6 compression.  Lt hemiparesis  -Post surgical decompression ([place]zer) - Cont analgesia as ordered   Staph bacteremia/cervical discitis/epidural abscess Cultures: Blood cultures 6/1:  Staph aureus CSF 6/1: ng Wound cx  6/2 - staph aureus Antibiotics: per ID  Respiratory failure, s/p intubation for airway protection - resolved  Mild ventricular and atrial ectopy - Cont telemetry  Mild thrombocytopenia  - SCDs for DVT prophy   Hyperglycemia - Continue SSI and lantus   Transfer out of ICU. Discussed with Dr Butler Denmark. TRH to assume care 6/5 AM and PCCM will sign off   Billy Fischer, MD;  PCCM service; [place] [phone]

## 2011-10-21 NOTE — Progress Notes (Signed)
Inpatient Diabetes Program Recommendations  AACE/ADA: New Consensus Statement on Inpatient Glycemic Control (2009)  Target Ranges:  Prepandial:   less than 140 mg/dL      Peak postprandial:   less than 180 mg/dL (1-2 hours)      Critically ill patients:  140 - 180 mg/dL   Reason for Visit: Admitted with "hyperglycemia" noted  Inpatient Diabetes Program Recommendations HgbA1C: Please check.  Pt admitted with "hyperglycemia", but there is no diagnosis of diabetes.  Can confirm dx of dm or "at risk for diabets". Diet: Noted orders to advance as tolerated .Marland KitchenMarland Kitchenplease advance to carb modfied  Note: Thank you, Lenor Coffin, RN, CNS, Diabetes Coordinator [phone])

## 2011-10-21 NOTE — Evaluation (Signed)
Physical Therapy Evaluation Patient Details Name: Charles Ball MRN: [id number] DOB: [birth year] Today's Date: 10/21/2011 Time: 4098-1191 PT Time Calculation (min): 24 min  PT Assessment / Plan / Recommendation Clinical Impression  Pt is 61 y/o male admitted for fall at home 6/1.  Pt with acute respiratory and mechancial ventialation needed.  Pt found to have C4-C6 epidural abcess with spinal cord compression.  Therefore pt received anterior decompression surgery however continues to have left hemiparesis.      PT Assessment  Patient needs continued PT services    Follow Up Recommendations  Inpatient Rehab    Barriers to Discharge        lEquipment Recommendations  Defer to next venue    Recommendations for Other Services Rehab consult;OT consult   Frequency Min 5X/week    Precautions / Restrictions Precautions Precautions: Fall soft collar when OOB  Pertinent Vitals/Pain 10/10 neck pain;  premedicated      Mobility  Bed Mobility Bed Mobility: Rolling Right;Rolling Left;Right Sidelying to Sit;Supine to Sit;Sitting - Scoot to [place] of Bed Rolling Right: 1: +2 Total assist Rolling Right: Patient Percentage: 10% Rolling Left: 1: +2 Total assist Rolling Left: Patient Percentage: 50% Right Sidelying to Sit: 1: +2 Total assist Right Sidelying to Sit: Patient Percentage: 30% Supine to Sit: 1: +2 Total assist Supine to Sit: Patient Percentage: 10% Sitting - Scoot to Edge of Bed: 1: +2 Total assist Sitting - Scoot to Edge of Bed: Patient Percentage: 20% Details for Bed Mobility Assistance: +2 assist with all mobility and needs max cues for technique.      Exercises General Exercises - Upper Extremity Shoulder Flexion: AAROM;Both;Supine;5 reps Elbow Flexion: AAROM;Both;5 reps;Supine Elbow Extension: AAROM;Both;5 reps;Supine General Exercises - Lower Extremity Hip Flexion/Marching: AAROM;Both;5 reps;Supine   PT Diagnosis: Difficulty walking;Abnormality of gait;Generalized  weakness;Acute pain  PT Problem List: Decreased strength;Decreased range of motion;Decreased activity tolerance;Decreased balance;Decreased mobility;Decreased knowledge of use of DME;Pain PT Treatment Interventions: DME instruction;Functional mobility training;Therapeutic activities;Therapeutic exercise;Balance training;Neuromuscular re-education;Patient/family education;Wheelchair mobility training   PT Goals Acute Rehab PT Goals PT Goal Formulation: With patient Time For Goal Achievement: 11/04/11 Potential to Achieve Goals: Fair Pt will Roll Supine to Right Side: with mod assist PT Goal: Rolling Supine to Right Side - Progress: Goal set today Pt will Roll Supine to Left Side: with max assist PT Goal: Rolling Supine to Left Side - Progress: Goal set today Pt will go Supine/Side to Sit: with max assist PT Goal: Supine/Side to Sit - Progress: Goal set today Pt will Sit at Edge of Bed: with supervision;1-2 min PT Goal: Sit at [place] Of Bed - Progress: Goal set today Pt will go Sit to Supine/Side: with max assist PT Goal: Sit to Supine/Side - Progress: Goal set today Pt will Transfer Bed to Chair/Chair to Bed: with +2 total assist (sliding board or stand pivot) PT Transfer Goal: Bed to Chair/Chair to Bed - Progress: Goal set today Additional Goals Additional Goal #1: Pt will be able to stand using stand frame for 5 minutes to prepare for stand pivot transfers. PT Goal: Additional Goal #1 - Progress: Goal set today  Visit Information  Last PT Received On: 10/21/11 Assistance Needed: +2    Subjective Data  Subjective: "I can't move my left side at all."   Prior Functioning  Home Living Lives With:  (girlfriend) Available Help at Discharge: Family Type of Home: Mobile home Home Access: Ramped entrance Home Layout: One level Bathroom Shower/Tub: Tub/shower unit;Door Bathroom Toilet: Standard Bathroom Accessibility: Yes  How Accessible: Accessible via wheelchair Home Adaptive  Equipment: None Prior Function Level of Independence: Independent Driving: Yes Communication Communication: No difficulties Dominant Hand: Right    Cognition  Overall Cognitive Status: Appears within functional limits for tasks assessed/performed Orientation Level: Appears intact for tasks assessed (Used calendar for time) Behavior During Session: Other (comment) (Pt tends to be apprehensive with overall mobility.) Cognition - Other Comments: Pt with childlike tendancies at times.    Extremity/Trunk Assessment Right Upper Extremity Assessment RUE ROM/Strength/Tone: Deficits RUE ROM/Strength/Tone Deficits: 3+/5 bicep, 3+/5 tricep RUE Sensation: WFL - Light Touch RUE Coordination: Deficits Left Upper Extremity Assessment LUE ROM/Strength/Tone: Deficits LUE ROM/Strength/Tone Deficits: trace activation except 2/5 triceps LUE Sensation: Deficits LUE Sensation Deficits: impaired light touch Right Lower Extremity Assessment RLE ROM/Strength/Tone: Deficits RLE ROM/Strength/Tone Deficits: At least 3/5 with bed mobility RLE Sensation: WFL - Light Touch RLE Coordination: WFL - gross/fine motor Left Lower Extremity Assessment LLE ROM/Strength/Tone: Deficits LLE ROM/Strength/Tone Deficits: 1/5 quad; 1/5 hamstring LLE Sensation: Deficits LLE Sensation Deficits: light touch LLE Coordination: Deficits   Balance Balance Balance Assessed: Yes Static Sitting Balance Static Sitting - Balance Support: Feet supported Static Sitting - Level of Assistance: 3: Mod assist;2: Max assist Static Sitting - Comment/# of Minutes: ~10 minutes; Initial max (A) to maintain balance and trunk control with swaying in all directions.  Pt able to progress with mod (A).  End of Session PT - End of Session Equipment Utilized During Treatment: Cervical collar Activity Tolerance: Patient limited by fatigue;Patient limited by pain Patient left: in bed;with call bell/phone within reach;with bed alarm set Nurse  Communication: Mobility status;Need for lift equipment   Onesha Krebbs 10/21/2011, 2:48 PM Jake Shark, PT DPT [phone]

## 2011-10-21 NOTE — Progress Notes (Signed)
Patient ID: Charles Ball, male   DOB: [birth year], 61 y.o.   MRN: [id number] Doing well from neurosurgical standpoint. Still has left hemiparesis. Needs aggressive rehab. Wound looks fine. OK for d/c from ICU anytime from my stand point.

## 2011-10-21 NOTE — Anesthesia Postprocedure Evaluation (Signed)
  Anesthesia Post-op Note  Patient: Charles Ball  Procedure(s) Performed: Procedure(s) (LRB): ANTERIOR CERVICAL DECOMPRESSION/DISCECTOMY FUSION 2 LEVELS (N/A)  Patient Location: PACU and NICU  Anesthesia Type: General  Level of Consciousness: awake, alert  and oriented  Airway and Oxygen Therapy: Patient connected to nasal cannula oxygen  Post-op Pain: mild  Post-op Assessment: Post-op Vital signs reviewed, Patient's Cardiovascular Status Stable, Respiratory Function Stable, Patent Airway, No signs of Nausea or vomiting and Pain level controlled  Post-op Vital Signs: Reviewed and stable  Complications: No apparent anesthesia complications

## 2011-10-22 ENCOUNTER — Encounter (HOSPITAL_COMMUNITY): Payer: Self-pay | Admitting: Physical Medicine and Rehabilitation

## 2011-10-22 DIAGNOSIS — R7881 Bacteremia: Secondary | ICD-10-CM

## 2011-10-22 DIAGNOSIS — R52 Pain, unspecified: Secondary | ICD-10-CM

## 2011-10-22 DIAGNOSIS — G061 Intraspinal abscess and granuloma: Secondary | ICD-10-CM

## 2011-10-22 DIAGNOSIS — M5090 Cervical disc disorder, unspecified, unspecified cervical region: Secondary | ICD-10-CM

## 2011-10-22 DIAGNOSIS — G062 Extradural and subdural abscess, unspecified: Secondary | ICD-10-CM

## 2011-10-22 DIAGNOSIS — M4712 Other spondylosis with myelopathy, cervical region: Secondary | ICD-10-CM

## 2011-10-22 DIAGNOSIS — G959 Disease of spinal cord, unspecified: Secondary | ICD-10-CM

## 2011-10-22 LAB — GLUCOSE, CAPILLARY
Glucose-Capillary: 166 mg/dL — ABNORMAL HIGH (ref 70–99)
Glucose-Capillary: 157 mg/dL — ABNORMAL HIGH (ref 70–99)

## 2011-10-22 LAB — WOUND CULTURE

## 2011-10-22 LAB — CSF CULTURE W GRAM STAIN

## 2011-10-22 LAB — TISSUE CULTURE: Gram Stain: NONE SEEN

## 2011-10-22 MED ORDER — SENNOSIDES-DOCUSATE SODIUM 8.6-50 MG PO TABS
2.0000 | ORAL_TABLET | Freq: Two times a day (BID) | ORAL | Status: DC
  Administered 2011-10-22 – 2011-10-30 (×8): 2 via ORAL
  Filled 2011-10-22 (×20): qty 2

## 2011-10-22 MED ORDER — HYDROCHLOROTHIAZIDE 12.5 MG PO CAPS
12.5000 mg | ORAL_CAPSULE | Freq: Every day | ORAL | Status: DC
  Administered 2011-10-22 – 2011-10-29 (×8): 12.5 mg via ORAL
  Filled 2011-10-22 (×9): qty 1

## 2011-10-22 MED ORDER — POLYETHYLENE GLYCOL 3350 17 G PO PACK
17.0000 g | PACK | Freq: Every day | ORAL | Status: DC
  Administered 2011-10-22 – 2011-10-23 (×2): 17 g via ORAL
  Filled 2011-10-22 (×2): qty 1

## 2011-10-22 MED ORDER — GLYCERIN (LAXATIVE) 2.1 G RE SUPP
1.0000 | Freq: Every day | RECTAL | Status: DC | PRN
  Administered 2011-10-22: 1 via RECTAL
  Filled 2011-10-22 (×3): qty 1

## 2011-10-22 MED ORDER — LISINOPRIL 10 MG PO TABS
10.0000 mg | ORAL_TABLET | Freq: Every day | ORAL | Status: DC
  Administered 2011-10-22 – 2011-10-23 (×2): 10 mg via ORAL
  Filled 2011-10-22 (×3): qty 1

## 2011-10-22 NOTE — Consult Note (Signed)
Physical Medicine and Rehabilitation Consult Reason for Consult: Spinal cord compression with left hemiparesis Referring Physician:  Dr. Laural Benes   HPI: Charles Ball is a 61 y.o. male who was found down at home on 10/18/11. He had likely fallen out of bed, and indicated he was unable to get up-laid on the floor for 14 hours. He was conversant at that time, and denied loss of consciousness. He subsequently became progressively altered, febrile and required intubation. CT head showed air and fluid in right maxillary sinus. Started on van, rocephin and ampicillin for suspicion of meningitis. LP done revealing glucose 88, WBC 46 and RBC 42B. Patient was noted to have decreased movement on left side. MRI brain done revealing scattered periventricular and subcortical disease-advanced for age, abnormal signal in CSF on flair images. MRI C-spine  Revealed diffuse dural enhancement throughout cervical spine, large disc herniation C4-C5, broad base disc herniation C5-C6 with internal enhancement, moderate stenosis B-C4-C5, moderate-severe foraminal stenosis B-C5/C6 with cord flattening, left paraspinal muscle enhancement.  BC X 2 positive for staph aureus.  Evaluated by Dr. Gerlene Fee and patient underwent C4-C6 decompression with fusion on 06/02. Wound culture and tissue culture positive for staph. Dr Ninetta Lights consulted and ancef recommended for possible diskitis with epidural abscess. TEE advised for work up. Patient continues with left hemiparesis and acute on chronic pain issues.  PT evaluation done yesterday. MD, PT recommending CIR.    Review of Systems  HENT: Positive for neck pain. Negative for hearing loss.   Eyes: Negative for blurred vision.  Respiratory: Positive for cough. Negative for sputum production and shortness of breath.   Cardiovascular: Negative for chest pain and palpitations.  Gastrointestinal: Positive for constipation. Negative for heartburn and nausea.  Musculoskeletal: Positive for  myalgias, back pain and joint pain.  Neurological: Positive for sensory change and focal weakness. Negative for headaches.   Past Medical History  Diagnosis Date  . Hypertension   . Insomnia secondary to chronic pain   . Generalized headaches    Past Surgical History  Procedure Date  . Right rotator cuff repair   . Left calcaneal surgery   . Anterior cervical decomp/discectomy fusion 10/19/2011    Procedure: ANTERIOR CERVICAL DECOMPRESSION/DISCECTOMY FUSION 2 LEVELS;  Surgeon: Reinaldo Meeker, MD;  Location: [facility];  Service: Neurosurgery;  Laterality: N/A;  Cervical four-five, five-six Anterior Cervical Decompression Fusion   Family History  Problem Relation Age of Onset  . Cancer Mother   . Cancer Father   . Cancer Maternal Grandmother   . Cancer Maternal Grandfather   . Cancer Paternal Grandmother   . Cancer Paternal Grandfather   . Diabetes Brother   . Diabetes Brother   . Hypertension Mother   . Hypertension Father   . Hypertension Brother   . Hypertension Brother    Social History: Lives with girlfriend.  Unemployed optician. He  reports that he has never smoked. He has never used smokeless tobacco. He reports that he drinks alcohol?  He reports that he does not use illicit drugs. Girlfriend unemployed but in good health.   Allergies: No Known Allergies  Medications Prior to Admission  Medication Sig Dispense Refill  . ALPRAZolam (XANAX) 1 MG tablet Take 1 mg by mouth 3 (three) times daily.      . hydrochlorothiazide (HYDRODIURIL) 25 MG tablet Take 25 mg by mouth daily.        Home: Home Living Lives With:  (girlfriend) Available Help at Discharge: Family Type of Home: Mobile home Home  Access: Ramped entrance Home Layout: One level Bathroom Shower/Tub: Tub/shower unit;Door [employer] Toilet: Standard Bathroom Accessibility: Yes How Accessible: Accessible via wheelchair Home Adaptive Equipment: None  Functional History: Prior Function Driving:  Yes Functional Status:  Mobility: Bed Mobility Bed Mobility: Rolling Right;Rolling Left;Right Sidelying to Sit;Supine to Sit;Sitting - Scoot to [place] of Bed Rolling Right: 1: +2 Total assist Rolling Right: Patient Percentage: 10% Rolling Left: 1: +2 Total assist Rolling Left: Patient Percentage: 50% Right Sidelying to Sit: 1: +2 Total assist Right Sidelying to Sit: Patient Percentage: 30% Supine to Sit: 1: +2 Total assist Supine to Sit: Patient Percentage: 10% Sitting - Scoot to Edge of Bed: 1: +2 Total assist Sitting - Scoot to Edge of Bed: Patient Percentage: 20%        ADL:    Cognition: Cognition Orientation Level: Oriented X4 Cognition Overall Cognitive Status: Appears within functional limits for tasks assessed/performed Orientation Level: Appears intact for tasks assessed (Used calendar for time) Behavior During Session: Other (comment) (Pt tends to be apprehensive with overall mobility.) Cognition - Other Comments: Pt with childlike tendancies at times.  Blood pressure 170/105, pulse 81, temperature 100.1 F (37.8 C), temperature source Oral, resp. rate 21, height 5\' 10"  (1.778 m), weight 115.939 kg (255 lb 9.6 oz), SpO2 94.00%. Physical Exam  Constitutional: He is oriented to person, place, and time. He appears well-developed. He appears lethargic. He is easily aroused.  HENT:  Head: Normocephalic and atraumatic.  Eyes: Pupils are equal, round, and reactive to light.  Neck:       Rotated to left and needs cues to stay midline.  Neck incision clean and dry with steri-strips in place.  Cardiovascular: Normal rate and regular rhythm.   Pulmonary/Chest: Effort normal. He has no wheezes. He has no rales.  Abdominal: Soft. There is tenderness.       Large denuded blister on abdomen  Musculoskeletal: He exhibits edema (LUE >RUE. +pedal edema) and tenderness.       Pain with attempts at ROM left shoulder.   Neurological: He is oriented to person, place, and time and  easily aroused. He appears lethargic. He displays abnormal reflex.       Slumped to the left with inability to correct. Arousable but difficulty  eliciting answers.  Moaning frequently with intermittent coughing noted. Quadriparesis left > right.  Left hemisensory deficits.   Skin: Skin is warm and dry.  motor strength is 0/5 in the left upper extremity and left lower extremity Right upper extremity 2 minus at the deltoid 3 minus at the bicep 2 minus at the tricep 1 at the finger flexors and extensors Right lower extremity is 0/5 in the hip flexors and knee extensors 1/5 in the ankle dorsiflexors plantar flexors Sensation is intact in all 4 extremities Neck Steri-Strips left anterior cervical incision healing well  Results for orders placed during the hospital encounter of 10/18/11 (from the past 24 hour(s))  GLUCOSE, CAPILLARY     Status: Abnormal   Collection Time   10/21/11 11:16 AM      Component Value Range   Glucose-Capillary 166 (*) 70 - 99 (mg/dL)  GLUCOSE, CAPILLARY     Status: Abnormal   Collection Time   10/21/11  1:06 PM      Component Value Range   Glucose-Capillary 118 (*) 70 - 99 (mg/dL)   Comment 1 Notify RN     Comment 2 Documented in Chart    GLUCOSE, CAPILLARY     Status: Abnormal  Collection Time   10/21/11  4:50 PM      Component Value Range   Glucose-Capillary 173 (*) 70 - 99 (mg/dL)   Comment 1 Documented in Chart     Comment 2 Notify RN    GLUCOSE, CAPILLARY     Status: Abnormal   Collection Time   10/21/11  9:07 PM      Component Value Range   Glucose-Capillary 187 (*) 70 - 99 (mg/dL)   Comment 1 Documented in Chart     Comment 2 Notify RN    GLUCOSE, CAPILLARY     Status: Abnormal   Collection Time   10/22/11  7:53 AM      Component Value Range   Glucose-Capillary 166 (*) 70 - 99 (mg/dL)   Comment 1 Documented in Chart     Comment 2 Notify RN     Dg Chest Port 1 View  10/21/2011  *RADIOLOGY REPORT*  Clinical Data: Postop cervical spine surgery.  PORTABLE  CHEST - 1 VIEW  Comparison: 10/20/2011.  Findings: The heart is mildly enlarged but stable.  The endotracheal tube and NG tubes have been removed.  Low lung volumes with increase and vascular crowding atelectasis.  Probable mild vascular congestion.  The  IMPRESSION:  1.  Low lung volumes post extubation with increase and vascular crowding and atelectasis. 2.  Mild vascular congestion.  Original Report Authenticated By: P. Loralie Champagne, M.D.    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 Does the need for close, 24 hr/day medical supervision in concert with the patient's rehab needs make it unreasonable for this patient to be served in a less intensive setting? Potentially 2. Co-Morbidities requiring supervision/potential complications: possible epidural abscess, hypertension, neurogenic bowel and bladder 3. Due to bladder management, bowel management, safety, skin/wound care, disease management, medication administration, pain management and patient education, does the patient require 24 hr/day rehab nursing? Yes 4. Does the patient require coordinated care of a physician, rehab nurse, PT (1-2 hrs/day, 5 days/week) and OT (1-2 hrs/day, 5 days/week) to address physical and functional deficits in the context of the above medical diagnosis(es)? Potentially Addressing deficits in the following areas: balance, endurance, locomotion, strength, transferring, bowel/bladder control, bathing, dressing, toileting and psychosocial support 5. Can the patient actively participate in an intensive therapy program of at least 3 hrs of therapy per day at least 5 days per week? Potentially 6. The potential for patient to make measurable gains while on inpatient rehab is good 7. Anticipated functional outcomes upon discharge from inpatient rehab are min assist wheelchair level with PT, min  assist ADLs wheelchair level with OT, not applicable with SLP. 8. Estimated rehab length of stay to reach the above functional goals is: 3 week 9. Does the patient have adequate social supports to accommodate these discharge functional goals? Potentially 10. Anticipated D/C setting: Home 11. Anticipated post D/C treatments: HH therapy 12. Overall Rehab/Functional Prognosis: good  RECOMMENDATIONS: This patient's condition is appropriate for continued rehabilitative care in the following setting: CIR Patient has agreed to participate in recommended program. Potentially Note that insurance prior authorization may be required for reimbursement for recommended care.  Comment:currently not able to tolerate a more intensive rehabilitation program. Rehabilitation RN will follow PT and OT progress. When his ability to participate improves and if a 24 7 caregiver is identified then a CIR admission would be appropriate    10/22/2011

## 2011-10-22 NOTE — Progress Notes (Signed)
INFECTIOUS DISEASE PROGRESS NOTE  ID: Charles Ball is a 61 y.o. male with  Active Problems:  Acute respiratory failure  Cervical (neck) region somatic dysfunction  Epidural abscess  Thrombocytopenia  Hyperglycemia  Bacteremia due to Staphylococcus  Subjective: Per wife, has slept most of day  Abtx:  Anti-infectives     Start     Dose/Rate Route Frequency Ordered Stop   10/21/11 1400   ceFAZolin (ANCEF) IVPB 1 g/50 mL premix        1 g 100 mL/hr over 30 Minutes Intravenous 3 times per day 10/21/11 1202     10/19/11 2007   bacitracin 16109 UNITS injection     Comments: [name], [name]: cabinet override         10/19/11 2007 10/20/11 0814   10/19/11 2005   bacitracin 50,000 Units in sodium chloride irrigation 0.9 % 500 mL irrigation  Status:  Discontinued          As needed 10/19/11 2058 10/19/11 2209   10/19/11 1200   vancomycin (VANCOCIN) 1,250 mg in sodium chloride 0.9 % 250 mL IVPB  Status:  Discontinued        1,250 mg 166.7 mL/hr over 90 Minutes Intravenous Every 12 hours 10/18/11 2157 10/21/11 1202   10/19/11 0200   acyclovir (ZOVIRAX) 930 mg in dextrose 5 % 150 mL IVPB  Status:  Discontinued        10 mg/kg  92.8 kg (Adjusted) 168.6 mL/hr over 60 Minutes Intravenous 3 times per day 10/19/11 0156 10/20/11 1607   10/18/11 2230   ampicillin (OMNIPEN) 2 g in sodium chloride 0.9 % 50 mL IVPB  Status:  Discontinued        2 g 150 mL/hr over 20 Minutes Intravenous Every 4 hours 10/18/11 2130 10/20/11 1607   10/18/11 2200   cefTRIAXone (ROCEPHIN) 2 g in dextrose 5 % 50 mL IVPB  Status:  Discontinued        2 g 100 mL/hr over 30 Minutes Intravenous Every 12 hours 10/18/11 2130 10/20/11 1607   10/18/11 2200   vancomycin (VANCOCIN) IVPB 1000 mg/200 mL premix        1,000 mg 200 mL/hr over 60 Minutes Intravenous  Once 10/18/11 2156 10/19/11 0258   10/18/11 2045   piperacillin-tazobactam (ZOSYN) IVPB 3.375 g  Status:  Discontinued        3.375 g 100 mL/hr over 30  Minutes Intravenous  Once 10/18/11 2029 10/18/11 2204   10/18/11 2045   vancomycin (VANCOCIN) IVPB 1000 mg/200 mL premix        1,000 mg 200 mL/hr over 60 Minutes Intravenous  Once 10/18/11 2029 10/18/11 2235          Medications:  Scheduled:   . ALPRAZolam  1 mg Oral TID  . antiseptic oral rinse  15 mL Mouth Rinse QID  .  ceFAZolin (ANCEF) IV  1 g Intravenous Q8H  . chlorhexidine  15 mL Mouth Rinse BID  . feeding supplement  237 mL Oral BID BM  . hydrochlorothiazide  12.5 mg Oral Daily  . insulin aspart  0-15 Units Subcutaneous TID WC  . insulin aspart  0-5 Units Subcutaneous QHS  . insulin aspart  4 Units Subcutaneous TID WC  . insulin glargine  5 Units Subcutaneous QHS  . lisinopril  10 mg Oral Daily  . polyethylene glycol  17 g Oral Daily  . senna-docusate  2 tablet Oral BID  . sodium chloride  3 mL Intravenous Q12H  Objective: Vital signs in last 24 hours: Temp:  [98 F (36.7 C)-100.1 F (37.8 C)] 98 F (36.7 C) (06/05 1757) Pulse Rate:  [81-92] 92  (06/05 1757) Resp:  [18-21] 18  (06/05 1757) BP: (168-174)/(95-111) 174/111 mmHg (06/05 1757) SpO2:  [92 %-95 %] 93 % (06/05 1757) Weight:  [115.939 kg (255 lb 9.6 oz)] 115.939 kg (255 lb 9.6 oz) (06/04 2110)   General appearance: no distress Resp: clear to auscultation bilaterally Cardio: regular rate and rhythm GI: normal findings: bowel sounds normal and soft, non-tender  Lab Results  Basename 10/21/11 0350 10/20/11 0400 10/20/11 0125  WBC 10.8* 12.2* --  HGB 12.2* 12.4* --  HCT 35.2* 35.4* --  NA 139 -- 136  K 4.0 -- 3.7  CL 106 -- 104  CO2 24 -- 22  BUN 30* -- 24*  CREATININE 0.78 -- 0.72  GLU -- -- --   Liver Panel  Basename 10/20/11 0125  PROT 5.8*  ALBUMIN 2.1*  AST 47*  ALT 28  ALKPHOS 50  BILITOT 0.4  BILIDIR --  IBILI --   Sedimentation Rate No results found for this basename: ESRSEDRATE in the last 72 hours C-Reactive Protein No results found for this basename: CRP:2 in the  last 72 hours  Microbiology: Recent Results (from the past 240 hour(s))  CULTURE, BLOOD (ROUTINE X 2)     Status: Normal   Collection Time   10/18/11  6:55 PM      Component Value Range Status Comment   Specimen Description BLOOD RIGHT ARM  5 ML IN [facility] BOTTLE   Final    Special Requests NONE   Final    Culture  Setup Time [id number]   Final    Culture     Final    Value: STAPHYLOCOCCUS AUREUS     Note: RIFAMPIN AND GENTAMICIN SHOULD NOT BE USED AS SINGLE DRUGS FOR TREATMENT OF STAPH INFECTIONS.     Note: Gram Stain Report Called to,Read Back By and Verified With: RN S. DILLON ON 10/19/11 AT 1500 BY DTERRY   Report Status 10/21/2011 FINAL   Final    Organism ID, Bacteria STAPHYLOCOCCUS AUREUS   Final   URINE CULTURE     Status: Normal   Collection Time   10/18/11  8:14 PM      Component Value Range Status Comment   Specimen Description URINE, CATHETERIZED   Final    Special Requests NONE   Final    Culture  Setup Time [id number]   Final    Colony Count NO GROWTH   Final    Culture NO GROWTH   Final    Report Status 10/20/2011 FINAL   Final   CULTURE, BLOOD (ROUTINE X 2)     Status: Normal   Collection Time   10/18/11  9:15 PM      Component Value Range Status Comment   Specimen Description BLOOD LEFT HAND   Final    Special Requests BOTTLES DRAWN AEROBIC AND ANAEROBIC 4CC   Final    Culture  Setup Time [id number]   Final    Culture     Final    Value: STAPHYLOCOCCUS AUREUS     Note: SUSCEPTIBILITIES PERFORMED ON PREVIOUS CULTURE WITHIN THE LAST 5 DAYS.     Note: Gram Stain Report Called to,Read Back By and Verified With: RN S. DILLON ON 10/19/11 AT 1840 BY DTERRY   Report Status 10/21/2011 FINAL   Final   CSF CULTURE  Status: Normal   Collection Time   10/18/11 11:45 PM      Component Value Range Status Comment   Specimen Description CSF   Final    Special Requests Normal   Final    Gram Stain     Final    Value: CYTOSPIN SLIDE WBC PRESENT,BOTH PMN AND MONONUCLEAR     NO  ORGANISMS SEEN     Gram Stain Report Called to,Read Back By and Verified With: Gram Stain Report Called to,Read Back By and Verified With: TWOODS RN AT 0141 ON [id number] BY DLONG Performed by [facility]   Culture NO GROWTH 3 DAYS   Final    Report Status 10/22/2011 FINAL   Final   GRAM STAIN     Status: Normal   Collection Time   10/18/11 11:45 PM      Component Value Range Status Comment   Specimen Description CSF   Final    Special Requests Normal   Final    Gram Stain     Final    Value: WBC PRESENT,BOTH PMN AND MONONUCLEAR     NO ORGANISMS SEEN     CYTO SPUN SLIDE     Gram Stain Report Called to,Read Back By and Verified With: TWOODS RN AT 0141 ON [id number] BY DLONG   Report Status 10/19/2011 FINAL   Final   MRSA PCR SCREENING     Status: Normal   Collection Time   10/19/11 12:23 AM      Component Value Range Status Comment   MRSA by PCR NEGATIVE  NEGATIVE  Final   WOUND CULTURE     Status: Normal   Collection Time   10/19/11  8:29 PM      Component Value Range Status Comment   Specimen Description WOUND NECK CERVICAL FOUR FIVE DISC SPACE   Final    Special Requests NONE   Final    Gram Stain     Final    Value: RARE WBC PRESENT, PREDOMINANTLY MONONUCLEAR     NO ORGANISMS SEEN     Performed at [facility]   Culture     Final    Value: MODERATE STAPHYLOCOCCUS AUREUS     Note: SUSCEPTIBILITIES PERFORMED ON PREVIOUS CULTURE WITHIN THE LAST 5 DAYS.   Report Status 10/22/2011 FINAL   Final   AFB CULTURE WITH SMEAR     Status: Normal (Preliminary result)   Collection Time   10/19/11  8:29 PM      Component Value Range Status Comment   Specimen Description WOUND NECK CERVICAL FOUR FIVE DISC SPACE   Final    Special Requests NONE   Final    ACID FAST SMEAR NO ACID FAST BACILLI SEEN   Final    Culture     Final    Value: CULTURE WILL BE EXAMINED FOR 6 WEEKS BEFORE ISSUING A FINAL REPORT   Report Status PENDING   Incomplete   GRAM STAIN     Status: Normal   Collection Time    10/19/11  8:29 PM      Component Value Range Status Comment   Specimen Description WOUND NECK CERVICAL FOUR FIVE DISC SPACE   Final    Special Requests NONE   Final    Gram Stain     Final    Value: RARE WBC PRESENT, PREDOMINANTLY MONONUCLEAR     NO ORGANISMS SEEN   Report Status 10/19/2011 FINAL   Final   ANAEROBIC CULTURE  Status: Normal (Preliminary result)   Collection Time   10/19/11  8:33 PM      Component Value Range Status Comment   Specimen Description NECK CERVICAL FOUR FIVE DISC SPACE   Final    Special Requests NONE   Final    Gram Stain PENDING   Incomplete    Culture     Final    Value: NO ANAEROBES ISOLATED; CULTURE IN PROGRESS FOR 5 DAYS   Report Status PENDING   Incomplete   TISSUE CULTURE     Status: Normal   Collection Time   10/19/11  8:34 PM      Component Value Range Status Comment   Specimen Description TISSUE NECK CERVICAL FOUR FIVE DISC   Final    Special Requests NONE   Final    Gram Stain     Final    Value: NO WBC SEEN     NO ORGANISMS SEEN     Performed at [facility]   Culture     Final    Value: MODERATE STAPHYLOCOCCUS AUREUS     Note: RIFAMPIN AND GENTAMICIN SHOULD NOT BE USED AS SINGLE DRUGS FOR TREATMENT OF STAPH INFECTIONS.   Report Status 10/22/2011 FINAL   Final    Organism ID, Bacteria STAPHYLOCOCCUS AUREUS   Final   AFB CULTURE WITH SMEAR     Status: Normal (Preliminary result)   Collection Time   10/19/11  8:34 PM      Component Value Range Status Comment   Specimen Description TISSUE NECK CERVICAL FOUR FIVE DISC   Final    Special Requests NONE   Final    ACID FAST SMEAR NO ACID FAST BACILLI SEEN   Final    Culture     Final    Value: CULTURE WILL BE EXAMINED FOR 6 WEEKS BEFORE ISSUING A FINAL REPORT   Report Status PENDING   Incomplete   GRAM STAIN     Status: Normal   Collection Time   10/19/11  8:34 PM      Component Value Range Status Comment   Specimen Description TISSUE NECK CERVICAL FOUR FIVE DISC   Final    Special  Requests NONE   Final    Gram Stain     Final    Value: NO WBC SEEN     NO ORGANISMS SEEN   Report Status 10/19/2011 FINAL   Final   ANAEROBIC CULTURE     Status: Normal (Preliminary result)   Collection Time   10/19/11  8:37 PM      Component Value Range Status Comment   Specimen Description TISSUE CERIVAL FOUR FIVE DISC   Final    Special Requests NONE   Final    Gram Stain PENDING   Incomplete    Culture     Final    Value: NO ANAEROBES ISOLATED; CULTURE IN PROGRESS FOR 5 DAYS   Report Status PENDING   Incomplete   WOUND CULTURE     Status: Normal   Collection Time   10/19/11 10:14 PM      Component Value Range Status Comment   Specimen Description WOUND NECK CERVICAL FOUR FIVE EPIDURAL DISC SPACE   Final    Special Requests NONE   Final    Gram Stain     Final    Value: FEW WBC PRESENT,BOTH PMN AND MONONUCLEAR     RARE GRAM POSITIVE COCCI IN PAIRS     Performed at [facility]  Culture     Final    Value: MODERATE STAPHYLOCOCCUS AUREUS     Note: RIFAMPIN AND GENTAMICIN SHOULD NOT BE USED AS SINGLE DRUGS FOR TREATMENT OF STAPH INFECTIONS.   Report Status 10/22/2011 FINAL   Final    Organism ID, Bacteria STAPHYLOCOCCUS AUREUS   Final   ANAEROBIC CULTURE     Status: Normal (Preliminary result)   Collection Time   10/19/11 10:14 PM      Component Value Range Status Comment   Specimen Description NECK CERVICAL FOUR FIVE EPIDURAL DISC SPACE   Final    Special Requests NONE   Final    Gram Stain PENDING   Incomplete    Culture     Final    Value: NO ANAEROBES ISOLATED; CULTURE IN PROGRESS FOR 5 DAYS   Report Status PENDING   Incomplete   GRAM STAIN     Status: Normal   Collection Time   10/19/11 10:14 PM      Component Value Range Status Comment   Specimen Description NECK CERVICAL FOUR FIVE WPIDURAL DISC SPACE   Final    Special Requests NONE   Final    Gram Stain     Final    Value: FEW WBC PRESENT,BOTH PMN AND MONONUCLEAR     RARE GRAM POSITIVE COCCI IN PAIRS   Report  Status 10/19/2011 FINAL   Final   AFB CULTURE WITH SMEAR     Status: Normal (Preliminary result)   Collection Time   10/19/11 10:14 PM      Component Value Range Status Comment   Specimen Description NECK CERVICAL FOUR FIVE WPIDURAL DISC SPACE   Final    Special Requests NONE   Final    ACID FAST SMEAR NO ACID FAST BACILLI SEEN   Final    Culture     Final    Value: CULTURE WILL BE EXAMINED FOR 6 WEEKS BEFORE ISSUING A FINAL REPORT   Report Status PENDING   Incomplete   CULTURE, BLOOD (ROUTINE X 2)     Status: Normal (Preliminary result)   Collection Time   10/20/11  4:40 PM      Component Value Range Status Comment   Specimen Description BLOOD LEFT HAND   Final    Special Requests BOTTLES DRAWN AEROBIC AND ANAEROBIC 5CC   Final    Culture  Setup Time [id number]   Final    Culture     Final    Value:        BLOOD CULTURE RECEIVED NO GROWTH TO DATE CULTURE WILL BE HELD FOR 5 DAYS BEFORE ISSUING A FINAL NEGATIVE REPORT   Report Status PENDING   Incomplete     Studies/Results: Dg Chest Port 1 View  10/21/2011  *RADIOLOGY REPORT*  Clinical Data: Postop cervical spine surgery.  PORTABLE CHEST - 1 VIEW  Comparison: 10/20/2011.  Findings: The heart is mildly enlarged but stable.  The endotracheal tube and NG tubes have been removed.  Low lung volumes with increase and vascular crowding atelectasis.  Probable mild vascular congestion.  The  IMPRESSION:  1.  Low lung volumes post extubation with increase and vascular crowding and atelectasis. 2.  Mild vascular congestion.  Original Report Authenticated By: P. Loralie Champagne, M.D.     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  Appreciate rehab eval.  Watch MS  Johny Sax Infectious Diseases [phone] 10/22/2011, 6:02 PM   LOS: 4 days

## 2011-10-22 NOTE — Progress Notes (Signed)
Physical Therapy Treatment Patient Details Name: Charles Ball MRN: [id number] DOB: 07/18/1950 Today's Date: 10/22/2011 Time: 7846-9629 PT Time Calculation (min): 45 min  PT Assessment / Plan / Recommendation Comments on Treatment Session  Pt with limited participation today wanting to put this off till tomorrow. Needing a lot of encouragement for full participation as well as effort with exercises. Educated girlfriend on quad sets and 2 shoulder exercises she could assist with.     Follow Up Recommendations  Inpatient Rehab    Barriers to Discharge        Equipment Recommendations  Defer to next venue    Recommendations for Other Services OT consult;Rehab consult  Frequency Min 5X/week   Plan Discharge plan remains appropriate;Frequency remains appropriate    Precautions / Restrictions Precautions Precautions: Fall       Mobility  Bed Mobility Rolling Right: 1: +2 Total assist Rolling Right: Patient Percentage: 20% Rolling Left: Patient Percentage: 0% Details for Bed Mobility Assistance: rolling today appears more difficult per notes from yesterday, cues for sequencing for rolling (to roll left pt needed facilitation to reach with RUE but unable to grab for rail with little to no grip in right hand (swelling? weakness?), facilitation at hips and trunk for initiation of roll and use of pad to assist with follow through); rolling right was a total assist; no other bed mobility performed but pt repositioned with pillows under each arm     Exercises General Exercises - Upper Extremity Shoulder Flexion: AAROM;Both;5 reps;Supine Shoulder Extension: AROM;Right;5 reps;Supine (instruction on how to use soft touch call bell) Elbow Flexion: AAROM;Both;5 reps;Supine (10 on right, trace activity of left bicep) Elbow Extension: AAROM;Both;5 reps General Exercises - Lower Extremity Ankle Circles/Pumps: AAROM;PROM;Both;10 reps;Supine (passive DF stretch on left no mm activity, AAROM on  right) Quad Sets: AROM;Right;5 reps;Supine Heel Slides: AAROM;5 reps;PROM;Both (passive left )     PT Goals Acute Rehab PT Goals PT Goal: Rolling Supine to Right Side - Progress: Progressing toward goal PT Goal: Rolling Supine to Left Side - Progress: Progressing toward goal Pt will Perform Home Exercise Program: with min assist PT Goal: Perform Home Exercise Program - Progress: Goal set today  Visit Information  Last PT Received On: 10/22/11 Assistance Needed: +2 (+3 would be helpful)    Subjective Data  Subjective: I want my fentanyl, can we do this tomorrow.    Cognition  Overall Cognitive Status: Impaired Area of Impairment:  (slow processing today) Arousal/Alertness: Lethargic Orientation Level: Appears intact for tasks assessed Behavior During Session: Lethargic Cognition - Other Comments: again with childlike speak    Balance     End of Session PT - End of Session Activity Tolerance: Patient limited by fatigue;Patient limited by pain Patient left: in bed;with call bell/phone within reach (soft touch CB behind elbow, pt able to extend shld to push) Nurse Communication: Mobility status;Patient requests pain meds    Charles Ball 10/22/2011, 2:37 PM

## 2011-10-22 NOTE — Progress Notes (Signed)
BP elevated this AM. Pt has been in pain. Pt other wise asymptomatic. Dr. Laural Benes made aware. Will continue to monitor. [place], Rosanna Randy

## 2011-10-22 NOTE — Progress Notes (Signed)
BP 172/108 after scheduled BP meds and pain med. Dr. Laural Benes text paged to make aware. [place], Rosanna Randy

## 2011-10-22 NOTE — Progress Notes (Signed)
I will clarify caregivers and follow pt's progress with therapy to assist in determining rehab venue of CIR vs SNF rehab. I will follow up tomorrow. [phone] with questions.

## 2011-10-22 NOTE — Progress Notes (Signed)
[facility] Progress Note  10/22/2011   Subjective: Pt reporting neck pain and no BM.    Objective:  Vital signs in last 24 hours: Filed Vitals:   10/21/11 1800 10/21/11 2110 10/22/11 0502 10/22/11 1130  BP: 148/88 168/95 170/105 169/107  Pulse: 72 86 81 87  Temp: 98 F (36.7 C) 98.1 F (36.7 C) 100.1 F (37.8 C) 98.8 F (37.1 C)  TempSrc: Oral Oral Oral Oral  Resp: 20 19 21 18   Height:  5\' 10"  (1.778 m)    Weight:  115.939 kg (255 lb 9.6 oz)    SpO2: 98% 95% 94% 92%   Weight change:   Intake/Output Summary (Last 24 hours) at 10/22/11 1323 Last data filed at 10/22/11 1300  Gross per 24 hour  Intake    220 ml  Output     50 ml  Net    170 ml   No results found for this basename: HGBA1C   Lab Results  Component Value Date   CREATININE 0.78 10/21/2011    Review of Systems As above, otherwise all reviewed and reported negative  Physical Exam General - awake, no distress, cooperative HEENT - NCAT, MM dry Neck - wounds clean and dry Lungs - BBS, CTA CV - normal s1, s2 sounds Abd - soft, distended, no masses palpated Ext - no cyanosis, SCDs to bilateral LEs  Lab Results: Results for orders placed during the hospital encounter of 10/18/11 (from the past 24 hour(s))  GLUCOSE, CAPILLARY     Status: Abnormal   Collection Time   10/21/11  4:50 PM      Component Value Range   Glucose-Capillary 173 (*) 70 - 99 (mg/dL)   Comment 1 Documented in Chart     Comment 2 Notify RN    GLUCOSE, CAPILLARY     Status: Abnormal   Collection Time   10/21/11  9:07 PM      Component Value Range   Glucose-Capillary 187 (*) 70 - 99 (mg/dL)   Comment 1 Documented in Chart     Comment 2 Notify RN    GLUCOSE, CAPILLARY     Status: Abnormal   Collection Time   10/22/11  7:53 AM      Component Value Range   Glucose-Capillary 166 (*) 70 - 99 (mg/dL)   Comment 1 Documented in Chart     Comment 2 Notify RN    GLUCOSE, CAPILLARY     Status: Abnormal   Collection Time   10/22/11 12:03  PM      Component Value Range   Glucose-Capillary 157 (*) 70 - 99 (mg/dL)   Comment 1 Documented in Chart     Comment 2 Notify RN      Micro Results: Recent Results (from the past 240 hour(s))  CULTURE, BLOOD (ROUTINE X 2)     Status: Normal   Collection Time   10/18/11  6:55 PM      Component Value Range Status Comment   Specimen Description BLOOD RIGHT ARM  5 ML IN [facility] BOTTLE   Final    Special Requests NONE   Final    Culture  Setup Time [id number]   Final    Culture     Final    Value: STAPHYLOCOCCUS AUREUS     Note: RIFAMPIN AND GENTAMICIN SHOULD NOT BE USED AS SINGLE DRUGS FOR TREATMENT OF STAPH INFECTIONS.     Note: Gram Stain Report Called to,Read Back By and Verified With: RN S. DILLON ON 10/19/11  AT 1500 BY DTERRY   Report Status 10/21/2011 FINAL   Final    Organism ID, Bacteria STAPHYLOCOCCUS AUREUS   Final   URINE CULTURE     Status: Normal   Collection Time   10/18/11  8:14 PM      Component Value Range Status Comment   Specimen Description URINE, CATHETERIZED   Final    Special Requests NONE   Final    Culture  Setup Time [id number]   Final    Colony Count NO GROWTH   Final    Culture NO GROWTH   Final    Report Status 10/20/2011 FINAL   Final   CULTURE, BLOOD (ROUTINE X 2)     Status: Normal   Collection Time   10/18/11  9:15 PM      Component Value Range Status Comment   Specimen Description BLOOD LEFT HAND   Final    Special Requests BOTTLES DRAWN AEROBIC AND ANAEROBIC 4CC   Final    Culture  Setup Time [id number]   Final    Culture     Final    Value: STAPHYLOCOCCUS AUREUS     Note: SUSCEPTIBILITIES PERFORMED ON PREVIOUS CULTURE WITHIN THE LAST 5 DAYS.     Note: Gram Stain Report Called to,Read Back By and Verified With: RN S. DILLON ON 10/19/11 AT 1840 BY DTERRY   Report Status 10/21/2011 FINAL   Final   CSF CULTURE     Status: Normal   Collection Time   10/18/11 11:45 PM      Component Value Range Status Comment   Specimen Description CSF   Final     Special Requests Normal   Final    Gram Stain     Final    Value: CYTOSPIN SLIDE WBC PRESENT,BOTH PMN AND MONONUCLEAR     NO ORGANISMS SEEN     Gram Stain Report Called to,Read Back By and Verified With: Gram Stain Report Called to,Read Back By and Verified With: TWOODS RN AT 0141 ON [id number] BY DLONG Performed by [facility]   Culture NO GROWTH 3 DAYS   Final    Report Status 10/22/2011 FINAL   Final   GRAM STAIN     Status: Normal   Collection Time   10/18/11 11:45 PM      Component Value Range Status Comment   Specimen Description CSF   Final    Special Requests Normal   Final    Gram Stain     Final    Value: WBC PRESENT,BOTH PMN AND MONONUCLEAR     NO ORGANISMS SEEN     CYTO SPUN SLIDE     Gram Stain Report Called to,Read Back By and Verified With: TWOODS RN AT 0141 ON [id number] BY DLONG   Report Status 10/19/2011 FINAL   Final   MRSA PCR SCREENING     Status: Normal   Collection Time   10/19/11 12:23 AM      Component Value Range Status Comment   MRSA by PCR NEGATIVE  NEGATIVE  Final   WOUND CULTURE     Status: Normal   Collection Time   10/19/11  8:29 PM      Component Value Range Status Comment   Specimen Description WOUND NECK CERVICAL FOUR FIVE DISC SPACE   Final    Special Requests NONE   Final    Gram Stain     Final    Value: RARE WBC PRESENT, PREDOMINANTLY MONONUCLEAR  NO ORGANISMS SEEN     Performed at [facility]   Culture     Final    Value: MODERATE STAPHYLOCOCCUS AUREUS     Note: SUSCEPTIBILITIES PERFORMED ON PREVIOUS CULTURE WITHIN THE LAST 5 DAYS.   Report Status 10/22/2011 FINAL   Final   AFB CULTURE WITH SMEAR     Status: Normal (Preliminary result)   Collection Time   10/19/11  8:29 PM      Component Value Range Status Comment   Specimen Description WOUND NECK CERVICAL FOUR FIVE DISC SPACE   Final    Special Requests NONE   Final    ACID FAST SMEAR NO ACID FAST BACILLI SEEN   Final    Culture     Final    Value: CULTURE WILL BE EXAMINED FOR  6 WEEKS BEFORE ISSUING A FINAL REPORT   Report Status PENDING   Incomplete   GRAM STAIN     Status: Normal   Collection Time   10/19/11  8:29 PM      Component Value Range Status Comment   Specimen Description WOUND NECK CERVICAL FOUR FIVE DISC SPACE   Final    Special Requests NONE   Final    Gram Stain     Final    Value: RARE WBC PRESENT, PREDOMINANTLY MONONUCLEAR     NO ORGANISMS SEEN   Report Status 10/19/2011 FINAL   Final   ANAEROBIC CULTURE     Status: Normal (Preliminary result)   Collection Time   10/19/11  8:33 PM      Component Value Range Status Comment   Specimen Description NECK CERVICAL FOUR FIVE DISC SPACE   Final    Special Requests NONE   Final    Gram Stain PENDING   Incomplete    Culture     Final    Value: NO ANAEROBES ISOLATED; CULTURE IN PROGRESS FOR 5 DAYS   Report Status PENDING   Incomplete   TISSUE CULTURE     Status: Normal   Collection Time   10/19/11  8:34 PM      Component Value Range Status Comment   Specimen Description TISSUE NECK CERVICAL FOUR FIVE DISC   Final    Special Requests NONE   Final    Gram Stain     Final    Value: NO WBC SEEN     NO ORGANISMS SEEN     Performed at [facility]   Culture     Final    Value: MODERATE STAPHYLOCOCCUS AUREUS     Note: RIFAMPIN AND GENTAMICIN SHOULD NOT BE USED AS SINGLE DRUGS FOR TREATMENT OF STAPH INFECTIONS.   Report Status 10/22/2011 FINAL   Final    Organism ID, Bacteria STAPHYLOCOCCUS AUREUS   Final   AFB CULTURE WITH SMEAR     Status: Normal (Preliminary result)   Collection Time   10/19/11  8:34 PM      Component Value Range Status Comment   Specimen Description TISSUE NECK CERVICAL FOUR FIVE DISC   Final    Special Requests NONE   Final    ACID FAST SMEAR NO ACID FAST BACILLI SEEN   Final    Culture     Final    Value: CULTURE WILL BE EXAMINED FOR 6 WEEKS BEFORE ISSUING A FINAL REPORT   Report Status PENDING   Incomplete   GRAM STAIN     Status: Normal   Collection Time   10/19/11  8:34  PM      Component Value Range Status Comment   Specimen Description TISSUE NECK CERVICAL FOUR FIVE DISC   Final    Special Requests NONE   Final    Gram Stain     Final    Value: NO WBC SEEN     NO ORGANISMS SEEN   Report Status 10/19/2011 FINAL   Final   ANAEROBIC CULTURE     Status: Normal (Preliminary result)   Collection Time   10/19/11  8:37 PM      Component Value Range Status Comment   Specimen Description TISSUE CERIVAL FOUR FIVE DISC   Final    Special Requests NONE   Final    Gram Stain PENDING   Incomplete    Culture     Final    Value: NO ANAEROBES ISOLATED; CULTURE IN PROGRESS FOR 5 DAYS   Report Status PENDING   Incomplete   WOUND CULTURE     Status: Normal   Collection Time   10/19/11 10:14 PM      Component Value Range Status Comment   Specimen Description WOUND NECK CERVICAL FOUR FIVE EPIDURAL DISC SPACE   Final    Special Requests NONE   Final    Gram Stain     Final    Value: FEW WBC PRESENT,BOTH PMN AND MONONUCLEAR     RARE GRAM POSITIVE COCCI IN PAIRS     Performed at [facility]   Culture     Final    Value: MODERATE STAPHYLOCOCCUS AUREUS     Note: RIFAMPIN AND GENTAMICIN SHOULD NOT BE USED AS SINGLE DRUGS FOR TREATMENT OF STAPH INFECTIONS.   Report Status 10/22/2011 FINAL   Final    Organism ID, Bacteria STAPHYLOCOCCUS AUREUS   Final   ANAEROBIC CULTURE     Status: Normal (Preliminary result)   Collection Time   10/19/11 10:14 PM      Component Value Range Status Comment   Specimen Description NECK CERVICAL FOUR FIVE EPIDURAL DISC SPACE   Final    Special Requests NONE   Final    Gram Stain PENDING   Incomplete    Culture     Final    Value: NO ANAEROBES ISOLATED; CULTURE IN PROGRESS FOR 5 DAYS   Report Status PENDING   Incomplete   GRAM STAIN     Status: Normal   Collection Time   10/19/11 10:14 PM      Component Value Range Status Comment   Specimen Description NECK CERVICAL FOUR FIVE WPIDURAL DISC SPACE   Final    Special Requests NONE   Final     Gram Stain     Final    Value: FEW WBC PRESENT,BOTH PMN AND MONONUCLEAR     RARE GRAM POSITIVE COCCI IN PAIRS   Report Status 10/19/2011 FINAL   Final   AFB CULTURE WITH SMEAR     Status: Normal (Preliminary result)   Collection Time   10/19/11 10:14 PM      Component Value Range Status Comment   Specimen Description NECK CERVICAL FOUR FIVE WPIDURAL DISC SPACE   Final    Special Requests NONE   Final    ACID FAST SMEAR NO ACID FAST BACILLI SEEN   Final    Culture     Final    Value: CULTURE WILL BE EXAMINED FOR 6 WEEKS BEFORE ISSUING A FINAL REPORT   Report Status PENDING   Incomplete   CULTURE, BLOOD (ROUTINE X 2)  Status: Normal (Preliminary result)   Collection Time   10/20/11  4:40 PM      Component Value Range Status Comment   Specimen Description BLOOD LEFT HAND   Final    Special Requests BOTTLES DRAWN AEROBIC AND ANAEROBIC 5CC   Final    Culture  Setup Time [id number]   Final    Culture     Final    Value:        BLOOD CULTURE RECEIVED NO GROWTH TO DATE CULTURE WILL BE HELD FOR 5 DAYS BEFORE ISSUING A FINAL NEGATIVE REPORT   Report Status PENDING   Incomplete     Medications:  Scheduled Meds:   . ALPRAZolam  1 mg Oral TID  . antiseptic oral rinse  15 mL Mouth Rinse QID  .  ceFAZolin (ANCEF) IV  1 g Intravenous Q8H  . chlorhexidine  15 mL Mouth Rinse BID  . feeding supplement  237 mL Oral BID BM  . insulin aspart  0-15 Units Subcutaneous TID WC  . insulin aspart  0-5 Units Subcutaneous QHS  . insulin aspart  4 Units Subcutaneous TID WC  . insulin glargine  5 Units Subcutaneous QHS  . polyethylene glycol  17 g Oral Daily  . sodium chloride  3 mL Intravenous Q12H   Continuous Infusions:   . sodium chloride     PRN Meds:.sodium chloride, acetaminophen, acetaminophen, fentaNYL, Glycerin (Adult), menthol-cetylpyridinium, ondansetron (ZOFRAN) IV, oxyCODONE, phenol, sodium chloride  Assessment/Plan:  C4-C5, C5-C6 compression. Lt hemiparesis  -Post surgical  decompression Patent examiner)  - Cont analgesia - see orders  Staph bacteremia/cervical discitis/epidural abscess  Cultures:  Blood cultures 6/1: Staph aureus  CSF 6/1: ng  Wound cx 6/2 - staph aureus  Continue cefazolin IV Will consult cardiology for TEE  Respiratory failure, s/p intubation for airway protection - resolved   Constipation  - intensify medical therapy, see orders  Mild ventricular and atrial ectopy - Cont telemetry   Mild thrombocytopenia  - SCDs for DVT prophy   Hyperglycemia - controlled - Continue SSI and lantus   LOS: 4 days   Juda Lajeunesse 10/22/2011, 1:23 PM  Cleora Fleet, MD, CDE, FAAFP [facility] [place], [place]  [phone]

## 2011-10-23 ENCOUNTER — Inpatient Hospital Stay (HOSPITAL_COMMUNITY): Payer: Medicaid Other

## 2011-10-23 ENCOUNTER — Encounter (HOSPITAL_COMMUNITY)
Admission: EM | Disposition: A | Discharge status: Skilled Nursing Facility | Payer: Self-pay | Source: Ambulatory Visit | Attending: Internal Medicine

## 2011-10-23 ENCOUNTER — Ambulatory Visit (HOSPITAL_COMMUNITY): Admit: 2011-10-23 | Payer: Self-pay | Admitting: Cardiology

## 2011-10-23 DIAGNOSIS — R52 Pain, unspecified: Secondary | ICD-10-CM

## 2011-10-23 DIAGNOSIS — J329 Chronic sinusitis, unspecified: Secondary | ICD-10-CM

## 2011-10-23 DIAGNOSIS — G959 Disease of spinal cord, unspecified: Secondary | ICD-10-CM

## 2011-10-23 DIAGNOSIS — G061 Intraspinal abscess and granuloma: Secondary | ICD-10-CM

## 2011-10-23 DIAGNOSIS — R509 Fever, unspecified: Secondary | ICD-10-CM

## 2011-10-23 DIAGNOSIS — G039 Meningitis, unspecified: Secondary | ICD-10-CM

## 2011-10-23 DIAGNOSIS — R7881 Bacteremia: Secondary | ICD-10-CM

## 2011-10-23 HISTORY — PX: TEE WITHOUT CARDIOVERSION: SHX5443

## 2011-10-23 LAB — CBC
MCH: 29.5 pg (ref 26.0–34.0)
MCHC: 34.3 g/dL (ref 30.0–36.0)
MCV: 86 fL (ref 78.0–100.0)
Platelets: 168 10*3/uL (ref 150–400)
RBC: 4.65 MIL/uL (ref 4.22–5.81)
RDW: 13.6 % (ref 11.5–15.5)

## 2011-10-23 LAB — COMPREHENSIVE METABOLIC PANEL
AST: 29 U/L (ref 0–37)
CO2: 21 mEq/L (ref 19–32)
Calcium: 7.7 mg/dL — ABNORMAL LOW (ref 8.4–10.5)
Creatinine, Ser: 3.39 mg/dL — ABNORMAL HIGH (ref 0.50–1.35)
GFR calc non Af Amer: 19 mL/min — ABNORMAL LOW (ref >90)
Total Protein: 5.8 g/dL — ABNORMAL LOW (ref 6.0–8.3)

## 2011-10-23 SURGERY — ECHOCARDIOGRAM, TRANSESOPHAGEAL
Anesthesia: Moderate Sedation

## 2011-10-23 MED ORDER — MIDAZOLAM HCL 10 MG/2ML IJ SOLN
INTRAMUSCULAR | Status: DC | PRN
  Administered 2011-10-23: 2 mg via INTRAVENOUS
  Administered 2011-10-23 (×2): 1 mg via INTRAVENOUS

## 2011-10-23 MED ORDER — BUTAMBEN-TETRACAINE-BENZOCAINE 2-2-14 % EX AERO
INHALATION_SPRAY | CUTANEOUS | Status: DC | PRN
  Administered 2011-10-23: 1 via TOPICAL

## 2011-10-23 MED ORDER — MIDAZOLAM HCL 10 MG/2ML IJ SOLN: 10.0000 mg | Freq: Once | INTRAMUSCULAR | Status: DC

## 2011-10-23 MED ORDER — FENTANYL CITRATE 0.05 MG/ML IJ SOLN
INTRAMUSCULAR | Status: DC | PRN
  Administered 2011-10-23 (×2): 25 ug via INTRAVENOUS

## 2011-10-23 MED ORDER — CEFAZOLIN SODIUM 1-5 GM-% IV SOLN
1.0000 g | Freq: Two times a day (BID) | INTRAVENOUS | Status: DC
  Administered 2011-10-23 – 2011-10-24 (×2): 1 g via INTRAVENOUS
  Filled 2011-10-23 (×3): qty 50

## 2011-10-23 MED ORDER — SODIUM CHLORIDE 0.9 % IV SOLN
INTRAVENOUS | Status: DC
  Administered 2011-10-23 (×2): via INTRAVENOUS

## 2011-10-23 MED ORDER — SODIUM CHLORIDE 0.45 % IV SOLN: INTRAVENOUS | Status: DC

## 2011-10-23 MED ORDER — FENTANYL CITRATE 0.05 MG/ML IJ SOLN: 250.0000 ug | Freq: Once | INTRAMUSCULAR | Status: DC

## 2011-10-23 MED ORDER — SODIUM CHLORIDE 0.9 % IV BOLUS (SEPSIS)
500.0000 mL | Freq: Once | INTRAVENOUS | Status: AC
  Administered 2011-10-23: 500 mL via INTRAVENOUS

## 2011-10-23 MED ORDER — ALPRAZOLAM 0.5 MG PO TABS
0.5000 mg | ORAL_TABLET | Freq: Three times a day (TID) | ORAL | Status: DC | PRN
  Administered 2011-10-23 – 2011-10-27 (×2): 0.5 mg via ORAL
  Filled 2011-10-23: qty 1
  Filled 2011-10-23: qty 2

## 2011-10-23 MED ORDER — BENZOCAINE 20 % MT SOLN
1.0000 "application " | OROMUCOSAL | Status: DC | PRN
  Filled 2011-10-23: qty 57

## 2011-10-23 MED ORDER — POLYETHYLENE GLYCOL 3350 17 G PO PACK
17.0000 g | PACK | Freq: Two times a day (BID) | ORAL | Status: DC
  Administered 2011-10-23 – 2011-10-28 (×3): 17 g via ORAL
  Filled 2011-10-23 (×17): qty 1

## 2011-10-23 NOTE — Progress Notes (Signed)
I contacted pt's girlfriend of 12 years by phone with his permission. She works 4 days per week and his adult children work full time.  Patient does not have caregiver support needed at home. He will need SNF rehab until functionally able to return home with intermittent assist or caregivers can be arranged 24/7 at home. Girlfriend is in agreement.  I will alert financial counselor to begin disability and medicaid applications asap. Recommend bowel regimen every day, not prn for this incomplete spinal cord injury. Please call with any questions. [phone]. I have discussed with SW.

## 2011-10-23 NOTE — CV Procedure (Signed)
Patient sedated with 4 mg IV Versed and 50 micrograms of IV Fentanyl.  Despite multiple attempts unable to pass the TEE scope into the esophagus. Dr. Rhea Belton (gastroenterology) also attempted without success.  Procedure aborted.  If TEE required will need to reschedule with anesthesia for direct visualization and placement of TEE probe.   Charles Sannes [place] MD, [facility]

## 2011-10-23 NOTE — Progress Notes (Signed)
The patient's family member came out into the hall way asking for linen, when I brought the linen in the room, the family had on gloves and was disimpacting the patient. I advised the family member to stop and began to assess the patient myself. The rectum area was red and swollen. I asked the patient how he felt, and he stated that he badly needed to have a bowel movement. I informed him that the MD had prescribed him medication and that he had a bowel movement yesterday (10/22/11). Patient and family member verbalized understanding of no more further disimpactions unless done by a healthcare personnel. Will continue to monitor.

## 2011-10-23 NOTE — Progress Notes (Signed)
Physical Therapy Treatment Patient Details Name: Charles Ball MRN: [id number] DOB: [birth year] Today's Date: 10/23/2011 Time: 7829-5621 PT Time Calculation (min): 49 min  PT Assessment / Plan / Recommendation Comments on Treatment Session  Pt participated as much as he could today  "Just bear with me".  Little voluntary movement on R and none to trace movement on L side.  Pain limiting, mostly nec and stomach    Follow Up Recommendations  Inpatient Rehab    Barriers to Discharge        Equipment Recommendations  Defer to next venue    Recommendations for Other Services OT consult;Rehab consult  Frequency Min 5X/week   Plan Discharge plan remains appropriate;Frequency remains appropriate    Precautions / Restrictions Precautions Precautions: Fall Required Braces or Orthoses: Other Brace/Splint Other Brace/Splint: soft cervical collar Restrictions Weight Bearing Restrictions: No   Pertinent Vitals/Pain 10/10     Mobility  Bed Mobility Bed Mobility: Rolling Right;Right Sidelying to Sit;Sit to Sidelying Right Rolling Right: 1: +2 Total assist Rolling Right: Patient Percentage: 10% Right Sidelying to Sit: 1: +2 Total assist;HOB flat Right Sidelying to Sit: Patient Percentage: 10% Sit to Sidelying Right: 1: +2 Total assist;HOB flat Sit to Sidelying Right: Patient Percentage: 0% Details for Bed Mobility Assistance: vc's for technique for normalized movement; truncal and extremity assist to help with roll, support and assist R UE to help Transfers Transfers: Not assessed Ambulation/Gait Stairs: No Wheelchair Mobility Wheelchair Mobility: No    Exercises General Exercises - Upper Extremity Shoulder Flexion: AAROM;Both;10 reps;Seated Shoulder Extension: AAROM;PROM;Both;5 reps;Seated Elbow Flexion: AAROM;Both;5 reps;Supine Elbow Extension: AAROM;Both;5 reps General Exercises - Lower Extremity Ankle Circles/Pumps: AAROM;PROM;Both;10 reps;Supine Heel Slides:  AAROM;PROM;Both;10 reps;Supine   PT Diagnosis:    PT Problem List:   PT Treatment Interventions:     PT Goals Acute Rehab PT Goals PT Goal Formulation: With patient Time For Goal Achievement: 11/04/11 Potential to Achieve Goals: Fair PT Goal: Rolling Supine to Right Side - Progress: Progressing toward goal PT Goal: Supine/Side to Sit - Progress: Progressing toward goal PT Goal: Sit at Edge Of Bed - Progress: Progressing toward goal PT Goal: Sit to Supine/Side - Progress: Not met Additional Goals PT Goal: Additional Goal #1 - Progress: Not met  Visit Information  Last PT Received On: 10/23/11 Assistance Needed: +2    Subjective Data  Subjective: I want the fentanyl and oxy, but not the zanax   Cognition  Overall Cognitive Status: Appears within functional limits for tasks assessed/performed Arousal/Alertness: Lethargic Orientation Level: Appears intact for tasks assessed Behavior During Session: Lethargic    Balance  Balance Balance Assessed: Yes Static Sitting Balance Static Sitting - Balance Support: Feet supported Static Sitting - Level of Assistance: 2: Max assist Static Sitting - Comment/# of Minutes: 20 generally max assist.  worked to activate the trunk muscles, but doing A /AA/P LAQ, basic moving reaching activtiy  End of Session PT - End of Session Activity Tolerance: Patient limited by fatigue;Patient limited by pain Patient left: in bed;with call bell/phone within reach Nurse Communication: Mobility status;Patient requests pain meds    Rayshon Albaugh, Eliseo Gum 10/23/2011, 12:22 PM  10/23/2011  Courtland Bing, PT [phone] (pager)

## 2011-10-23 NOTE — H&P (View-Only) (Signed)
[facility] Progress Note  10/23/2011   Subjective: Pt reporting persistent neck pain.  He has not been eating or drinking well because of sedation.  He says he doesn't want to take xanax any longer.    Objective:  Vital signs in last 24 hours: Filed Vitals:   10/22/11 1847 10/22/11 2018 10/23/11 0449 10/23/11 1000  BP: 172/108 161/101 121/79 154/85  Pulse:  94 88 81  Temp:  97.9 F (36.6 C) 97.8 F (36.6 C) 97.9 F (36.6 C)  TempSrc:  Oral Oral Oral  Resp:  18 18 20  Height:      Weight:  114.6 kg (252 lb 10.4 oz)    SpO2:  97% 96% 96%   Weight change: -1.34 kg (-2 lb 15.3 oz)  Intake/Output Summary (Last 24 hours) at 10/23/11 1158 Last data filed at 10/23/11 0858  Gross per 24 hour  Intake    270 ml  Output      1 ml  Net    269 ml   No results found for this basename: HGBA1C   Lab Results  Component Value Date   CREATININE 3.39* 10/23/2011    Review of Systems As above, otherwise all reviewed and reported negative  Physical Exam General - awake, no distress, cooperative  HEENT - NCAT, MM dry  Neck - wounds clean and dry Lungs - BBS, CTA  CV - normal s1, s2 sounds  Abd - soft, distended, no masses palpated  Ext - no cyanosis, SCDs to bilateral LEs  Lab Results: Results for orders placed during the hospital encounter of 10/18/11 (from the past 24 hour(s))  GLUCOSE, CAPILLARY     Status: Abnormal   Collection Time   10/22/11 12:03 PM      Component Value Range   Glucose-Capillary 157 (*) 70 - 99 (mg/dL)   Comment 1 Documented in Chart     Comment 2 Notify RN    GLUCOSE, CAPILLARY     Status: Abnormal   Collection Time   10/22/11  4:54 PM      Component Value Range   Glucose-Capillary 130 (*) 70 - 99 (mg/dL)   Comment 1 Documented in Chart     Comment 2 Notify RN    GLUCOSE, CAPILLARY     Status: Abnormal   Collection Time   10/22/11  8:22 PM      Component Value Range   Glucose-Capillary 114 (*) 70 - 99 (mg/dL)  CBC     Status: Abnormal   Collection Time   10/23/11  6:35 AM      Component Value Range   WBC 20.9 (*) 4.0 - 10.5 (K/uL)   RBC 4.65  4.22 - 5.81 (MIL/uL)   Hemoglobin 13.7  13.0 - 17.0 (g/dL)   HCT 40.0  39.0 - 52.0 (%)   MCV 86.0  78.0 - 100.0 (fL)   MCH 29.5  26.0 - 34.0 (pg)   MCHC 34.3  30.0 - 36.0 (g/dL)   RDW 13.6  11.5 - 15.5 (%)   Platelets 168  150 - 400 (K/uL)  COMPREHENSIVE METABOLIC PANEL     Status: Abnormal   Collection Time   10/23/11  6:35 AM      Component Value Range   Sodium 133 (*) 135 - 145 (mEq/L)   Potassium 4.0  3.5 - 5.1 (mEq/L)   Chloride 97  96 - 112 (mEq/L)   CO2 21  19 - 32 (mEq/L)   Glucose, Bld 159 (*) 70 - 99 (  mg/dL)   BUN 57 (*) 6 - 23 (mg/dL)   Creatinine, Ser 3.39 (*) 0.50 - 1.35 (mg/dL)   Calcium 7.7 (*) 8.4 - 10.5 (mg/dL)   Total Protein 5.8 (*) 6.0 - 8.3 (g/dL)   Albumin 2.1 (*) 3.5 - 5.2 (g/dL)   AST 29  0 - 37 (U/L)   ALT 22  0 - 53 (U/L)   Alkaline Phosphatase 59  39 - 117 (U/L)   Total Bilirubin 0.4  0.3 - 1.2 (mg/dL)   GFR calc non Af Amer 19 (*) >90 (mL/min)   GFR calc Af Amer 21 (*) >90 (mL/min)  GLUCOSE, CAPILLARY     Status: Abnormal   Collection Time   10/23/11  8:49 AM      Component Value Range   Glucose-Capillary 137 (*) 70 - 99 (mg/dL)    Micro Results: Recent Results (from the past 240 hour(s))  CULTURE, BLOOD (ROUTINE X 2)     Status: Normal   Collection Time   10/18/11  6:55 PM      Component Value Range Status Comment   Specimen Description BLOOD RIGHT ARM  5 ML IN EACH BOTTLE   Final    Special Requests NONE   Final    Culture  Setup Time [id number]   Final    Culture     Final    Value: STAPHYLOCOCCUS AUREUS     Note: RIFAMPIN AND GENTAMICIN SHOULD NOT BE USED AS SINGLE DRUGS FOR TREATMENT OF STAPH INFECTIONS.     Note: Gram Stain Report Called to,Read Back By and Verified With: RN S. DILLON ON 10/19/11 AT 1500 BY DTERRY   Report Status 10/21/2011 FINAL   Final    Organism ID, Bacteria STAPHYLOCOCCUS AUREUS   Final   URINE CULTURE      Status: Normal   Collection Time   10/18/11  8:14 PM      Component Value Range Status Comment   Specimen Description URINE, CATHETERIZED   Final    Special Requests NONE   Final    Culture  Setup Time [id number]   Final    Colony Count NO GROWTH   Final    Culture NO GROWTH   Final    Report Status 10/20/2011 FINAL   Final   CULTURE, BLOOD (ROUTINE X 2)     Status: Normal   Collection Time   10/18/11  9:15 PM      Component Value Range Status Comment   Specimen Description BLOOD LEFT HAND   Final    Special Requests BOTTLES DRAWN AEROBIC AND ANAEROBIC 4CC   Final    Culture  Setup Time [id number]   Final    Culture     Final    Value: STAPHYLOCOCCUS AUREUS     Note: SUSCEPTIBILITIES PERFORMED ON PREVIOUS CULTURE WITHIN THE LAST 5 DAYS.     Note: Gram Stain Report Called to,Read Back By and Verified With: RN S. DILLON ON 10/19/11 AT 1840 BY DTERRY   Report Status 10/21/2011 FINAL   Final   CSF CULTURE     Status: Normal   Collection Time   10/18/11 11:45 PM      Component Value Range Status Comment   Specimen Description CSF   Final    Special Requests Normal   Final    Gram Stain     Final    Value: CYTOSPIN SLIDE WBC PRESENT,BOTH PMN AND MONONUCLEAR     NO ORGANISMS SEEN       Gram Stain Report Called to,Read Back By and Verified With: Gram Stain Report Called to,Read Back By and Verified With: TWOODS RN AT 0141 ON [id number] BY DLONG Performed by [facility]   Culture NO GROWTH 3 DAYS   Final    Report Status 10/22/2011 FINAL   Final   GRAM STAIN     Status: Normal   Collection Time   10/18/11 11:45 PM      Component Value Range Status Comment   Specimen Description CSF   Final    Special Requests Normal   Final    Gram Stain     Final    Value: WBC PRESENT,BOTH PMN AND MONONUCLEAR     NO ORGANISMS SEEN     CYTO SPUN SLIDE     Gram Stain Report Called to,Read Back By and Verified With: TWOODS RN AT 0141 ON [id number] BY DLONG   Report Status 10/19/2011 FINAL   Final   MRSA  PCR SCREENING     Status: Normal   Collection Time   10/19/11 12:23 AM      Component Value Range Status Comment   MRSA by PCR NEGATIVE  NEGATIVE  Final   WOUND CULTURE     Status: Normal   Collection Time   10/19/11  8:29 PM      Component Value Range Status Comment   Specimen Description WOUND NECK CERVICAL FOUR FIVE DISC SPACE   Final    Special Requests NONE   Final    Gram Stain     Final    Value: RARE WBC PRESENT, PREDOMINANTLY MONONUCLEAR     NO ORGANISMS SEEN     Performed at [facility]   Culture     Final    Value: MODERATE STAPHYLOCOCCUS AUREUS     Note: SUSCEPTIBILITIES PERFORMED ON PREVIOUS CULTURE WITHIN THE LAST 5 DAYS.   Report Status 10/22/2011 FINAL   Final   AFB CULTURE WITH SMEAR     Status: Normal (Preliminary result)   Collection Time   10/19/11  8:29 PM      Component Value Range Status Comment   Specimen Description WOUND NECK CERVICAL FOUR FIVE DISC SPACE   Final    Special Requests NONE   Final    ACID FAST SMEAR NO ACID FAST BACILLI SEEN   Final    Culture     Final    Value: CULTURE WILL BE EXAMINED FOR 6 WEEKS BEFORE ISSUING A FINAL REPORT   Report Status PENDING   Incomplete   GRAM STAIN     Status: Normal   Collection Time   10/19/11  8:29 PM      Component Value Range Status Comment   Specimen Description WOUND NECK CERVICAL FOUR FIVE DISC SPACE   Final    Special Requests NONE   Final    Gram Stain     Final    Value: RARE WBC PRESENT, PREDOMINANTLY MONONUCLEAR     NO ORGANISMS SEEN   Report Status 10/19/2011 FINAL   Final   ANAEROBIC CULTURE     Status: Normal (Preliminary result)   Collection Time   10/19/11  8:33 PM      Component Value Range Status Comment   Specimen Description NECK CERVICAL FOUR FIVE DISC SPACE   Final    Special Requests NONE   Final    Gram Stain PENDING   Incomplete    Culture     Final    Value: NO   ANAEROBES ISOLATED; CULTURE IN PROGRESS FOR 5 DAYS   Report Status PENDING   Incomplete   TISSUE CULTURE      Status: Normal   Collection Time   10/19/11  8:34 PM      Component Value Range Status Comment   Specimen Description TISSUE NECK CERVICAL FOUR FIVE DISC   Final    Special Requests NONE   Final    Gram Stain     Final    Value: NO WBC SEEN     NO ORGANISMS SEEN     Performed at [facility]   Culture     Final    Value: MODERATE STAPHYLOCOCCUS AUREUS     Note: RIFAMPIN AND GENTAMICIN SHOULD NOT BE USED AS SINGLE DRUGS FOR TREATMENT OF STAPH INFECTIONS.   Report Status 10/22/2011 FINAL   Final    Organism ID, Bacteria STAPHYLOCOCCUS AUREUS   Final   AFB CULTURE WITH SMEAR     Status: Normal (Preliminary result)   Collection Time   10/19/11  8:34 PM      Component Value Range Status Comment   Specimen Description TISSUE NECK CERVICAL FOUR FIVE DISC   Final    Special Requests NONE   Final    ACID FAST SMEAR NO ACID FAST BACILLI SEEN   Final    Culture     Final    Value: CULTURE WILL BE EXAMINED FOR 6 WEEKS BEFORE ISSUING A FINAL REPORT   Report Status PENDING   Incomplete   GRAM STAIN     Status: Normal   Collection Time   10/19/11  8:34 PM      Component Value Range Status Comment   Specimen Description TISSUE NECK CERVICAL FOUR FIVE DISC   Final    Special Requests NONE   Final    Gram Stain     Final    Value: NO WBC SEEN     NO ORGANISMS SEEN   Report Status 10/19/2011 FINAL   Final   ANAEROBIC CULTURE     Status: Normal (Preliminary result)   Collection Time   10/19/11  8:37 PM      Component Value Range Status Comment   Specimen Description TISSUE CERIVAL FOUR FIVE DISC   Final    Special Requests NONE   Final    Gram Stain PENDING   Incomplete    Culture     Final    Value: NO ANAEROBES ISOLATED; CULTURE IN PROGRESS FOR 5 DAYS   Report Status PENDING   Incomplete   WOUND CULTURE     Status: Normal   Collection Time   10/19/11 10:14 PM      Component Value Range Status Comment   Specimen Description WOUND NECK CERVICAL FOUR FIVE EPIDURAL DISC SPACE   Final     Special Requests NONE   Final    Gram Stain     Final    Value: FEW WBC PRESENT,BOTH PMN AND MONONUCLEAR     RARE GRAM POSITIVE COCCI IN PAIRS     Performed at [facility]   Culture     Final    Value: MODERATE STAPHYLOCOCCUS AUREUS     Note: RIFAMPIN AND GENTAMICIN SHOULD NOT BE USED AS SINGLE DRUGS FOR TREATMENT OF STAPH INFECTIONS.   Report Status 10/22/2011 FINAL   Final    Organism ID, Bacteria STAPHYLOCOCCUS AUREUS   Final   ANAEROBIC CULTURE     Status: Normal (Preliminary result)   Collection   Time   10/19/11 10:14 PM      Component Value Range Status Comment   Specimen Description NECK CERVICAL FOUR FIVE EPIDURAL DISC SPACE   Final    Special Requests NONE   Final    Gram Stain PENDING   Incomplete    Culture     Final    Value: NO ANAEROBES ISOLATED; CULTURE IN PROGRESS FOR 5 DAYS   Report Status PENDING   Incomplete   GRAM STAIN     Status: Normal   Collection Time   10/19/11 10:14 PM      Component Value Range Status Comment   Specimen Description NECK CERVICAL FOUR FIVE WPIDURAL DISC SPACE   Final    Special Requests NONE   Final    Gram Stain     Final    Value: FEW WBC PRESENT,BOTH PMN AND MONONUCLEAR     RARE GRAM POSITIVE COCCI IN PAIRS   Report Status 10/19/2011 FINAL   Final   AFB CULTURE WITH SMEAR     Status: Normal (Preliminary result)   Collection Time   10/19/11 10:14 PM      Component Value Range Status Comment   Specimen Description NECK CERVICAL FOUR FIVE WPIDURAL DISC SPACE   Final    Special Requests NONE   Final    ACID FAST SMEAR NO ACID FAST BACILLI SEEN   Final    Culture     Final    Value: CULTURE WILL BE EXAMINED FOR 6 WEEKS BEFORE ISSUING A FINAL REPORT   Report Status PENDING   Incomplete   CULTURE, BLOOD (ROUTINE X 2)     Status: Normal (Preliminary result)   Collection Time   10/20/11  4:40 PM      Component Value Range Status Comment   Specimen Description BLOOD LEFT HAND   Final    Special Requests BOTTLES DRAWN AEROBIC AND  ANAEROBIC 5CC   Final    Culture  Setup Time [id number]   Final    Culture     Final    Value:        BLOOD CULTURE RECEIVED NO GROWTH TO DATE CULTURE WILL BE HELD FOR 5 DAYS BEFORE ISSUING A FINAL NEGATIVE REPORT   Report Status PENDING   Incomplete     Medications:  Scheduled Meds:   . antiseptic oral rinse  15 mL Mouth Rinse QID  .  ceFAZolin (ANCEF) IV  1 g Intravenous Q8H  . chlorhexidine  15 mL Mouth Rinse BID  . feeding supplement  237 mL Oral BID BM  . fentaNYL  250 mcg Intravenous Once  . hydrochlorothiazide  12.5 mg Oral Daily  . insulin aspart  0-15 Units Subcutaneous TID WC  . insulin aspart  0-5 Units Subcutaneous QHS  . insulin aspart  4 Units Subcutaneous TID WC  . insulin glargine  5 Units Subcutaneous QHS  . midazolam  10 mg Intravenous Once  . polyethylene glycol  17 g Oral Daily  . senna-docusate  2 tablet Oral BID  . sodium chloride  500 mL Intravenous Once  . DISCONTD: ALPRAZolam  1 mg Oral TID  . DISCONTD: lisinopril  10 mg Oral Daily  . DISCONTD: sodium chloride  3 mL Intravenous Q12H   Continuous Infusions:   . sodium chloride    . DISCONTD: sodium chloride    . DISCONTD: sodium chloride     PRN Meds:.acetaminophen, acetaminophen, ALPRAZolam, benzocaine, fentaNYL, Glycerin (Adult), menthol-cetylpyridinium, ondansetron (ZOFRAN) IV, oxyCODONE, phenol, DISCONTD: sodium   chloride, DISCONTD: sodium chloride  Assessment/Plan:  Acute Renal Failure / Dehydration -bolus IVFs and start NS (see orders) -DC lisinopril  C4-C5, C5-C6 compression. Lt hemiparesis  -Post surgical decompression ([place])  - Cont analgesia - see orders  -rehab CIR consult pending  Anxiety Disorder - changing alprazolam to 0.5 mg TID prn only, hold for sedation  Staph bacteremia/cervical discitis/epidural abscess  Cultures:  Blood cultures 6/1: Staph aureus  CSF 6/1: ng  Wound cx 6/2 - staph aureus  Continue cefazolin IV  TEE planned for today   Constipation  -continue  laxative therapy  Respiratory failure, s/p intubation for airway protection - resolved  Constipation  - intensify medical therapy, see orders   Mild ventricular and atrial ectopy - Cont telemetry   Mild thrombocytopenia - resolved - SCDs for DVT prophy   Hyperglycemia - controlled  - Continue SSI and lantus   LOS: 5 days   Charles Ball 10/23/2011, 11:58 AM   Laverne Klugh L. Christyan Reger, MD, CDE, FAAFP [facility] [employer] [place], [place]  [phone]  

## 2011-10-23 NOTE — Progress Notes (Signed)
[facility] Progress Note  10/23/2011   Subjective: Pt reporting persistent neck pain.  He has not been eating or drinking well because of sedation.  He says he doesn't want to take xanax any longer.    Objective:  Vital signs in last 24 hours: Filed Vitals:   10/22/11 1847 10/22/11 2018 10/23/11 0449 10/23/11 1000  BP: 172/108 161/101 121/79 154/85  Pulse:  94 88 81  Temp:  97.9 F (36.6 C) 97.8 F (36.6 C) 97.9 F (36.6 C)  TempSrc:  Oral Oral Oral  Resp:  18 18 20   Height:      Weight:  114.6 kg (252 lb 10.4 oz)    SpO2:  97% 96% 96%   Weight change: -1.34 kg (-2 lb 15.3 oz)  Intake/Output Summary (Last 24 hours) at 10/23/11 1158 Last data filed at 10/23/11 0858  Gross per 24 hour  Intake    270 ml  Output      1 ml  Net    269 ml   No results found for this basename: HGBA1C   Lab Results  Component Value Date   CREATININE 3.39* 10/23/2011    Review of Systems As above, otherwise all reviewed and reported negative  Physical Exam General - awake, no distress, cooperative  HEENT - NCAT, MM dry  Neck - wounds clean and dry Lungs - BBS, CTA  CV - normal s1, s2 sounds  Abd - soft, distended, no masses palpated  Ext - no cyanosis, SCDs to bilateral LEs  Lab Results: Results for orders placed during the hospital encounter of 10/18/11 (from the past 24 hour(s))  GLUCOSE, CAPILLARY     Status: Abnormal   Collection Time   10/22/11 12:03 PM      Component Value Range   Glucose-Capillary 157 (*) 70 - 99 (mg/dL)   Comment 1 Documented in Chart     Comment 2 Notify RN    GLUCOSE, CAPILLARY     Status: Abnormal   Collection Time   10/22/11  4:54 PM      Component Value Range   Glucose-Capillary 130 (*) 70 - 99 (mg/dL)   Comment 1 Documented in Chart     Comment 2 Notify RN    GLUCOSE, CAPILLARY     Status: Abnormal   Collection Time   10/22/11  8:22 PM      Component Value Range   Glucose-Capillary 114 (*) 70 - 99 (mg/dL)  CBC     Status: Abnormal   Collection Time   10/23/11  6:35 AM      Component Value Range   WBC 20.9 (*) 4.0 - 10.5 (K/uL)   RBC 4.65  4.22 - 5.81 (MIL/uL)   Hemoglobin 13.7  13.0 - 17.0 (g/dL)   HCT 40.9  81.1 - 91.4 (%)   MCV 86.0  78.0 - 100.0 (fL)   MCH 29.5  26.0 - 34.0 (pg)   MCHC 34.3  30.0 - 36.0 (g/dL)   RDW 78.2  95.6 - 21.3 (%)   Platelets 168  150 - 400 (K/uL)  COMPREHENSIVE METABOLIC PANEL     Status: Abnormal   Collection Time   10/23/11  6:35 AM      Component Value Range   Sodium 133 (*) 135 - 145 (mEq/L)   Potassium 4.0  3.5 - 5.1 (mEq/L)   Chloride 97  96 - 112 (mEq/L)   CO2 21  19 - 32 (mEq/L)   Glucose, Bld 159 (*) 70 - 99 (  mg/dL)   BUN 57 (*) 6 - 23 (mg/dL)   Creatinine, Ser 4.09 (*) 0.50 - 1.35 (mg/dL)   Calcium 7.7 (*) 8.4 - 10.5 (mg/dL)   Total Protein 5.8 (*) 6.0 - 8.3 (g/dL)   Albumin 2.1 (*) 3.5 - 5.2 (g/dL)   AST 29  0 - 37 (U/L)   ALT 22  0 - 53 (U/L)   Alkaline Phosphatase 59  39 - 117 (U/L)   Total Bilirubin 0.4  0.3 - 1.2 (mg/dL)   GFR calc non Af Amer 19 (*) >90 (mL/min)   GFR calc Af Amer 21 (*) >90 (mL/min)  GLUCOSE, CAPILLARY     Status: Abnormal   Collection Time   10/23/11  8:49 AM      Component Value Range   Glucose-Capillary 137 (*) 70 - 99 (mg/dL)    Micro Results: Recent Results (from the past 240 hour(s))  CULTURE, BLOOD (ROUTINE X 2)     Status: Normal   Collection Time   10/18/11  6:55 PM      Component Value Range Status Comment   Specimen Description BLOOD RIGHT ARM  5 ML IN [facility] BOTTLE   Final    Special Requests NONE   Final    Culture  Setup Time [id number]   Final    Culture     Final    Value: STAPHYLOCOCCUS AUREUS     Note: RIFAMPIN AND GENTAMICIN SHOULD NOT BE USED AS SINGLE DRUGS FOR TREATMENT OF STAPH INFECTIONS.     Note: Gram Stain Report Called to,Read Back By and Verified With: RN S. DILLON ON 10/19/11 AT 1500 BY DTERRY   Report Status 10/21/2011 FINAL   Final    Organism ID, Bacteria STAPHYLOCOCCUS AUREUS   Final   URINE CULTURE      Status: Normal   Collection Time   10/18/11  8:14 PM      Component Value Range Status Comment   Specimen Description URINE, CATHETERIZED   Final    Special Requests NONE   Final    Culture  Setup Time [id number]   Final    Colony Count NO GROWTH   Final    Culture NO GROWTH   Final    Report Status 10/20/2011 FINAL   Final   CULTURE, BLOOD (ROUTINE X 2)     Status: Normal   Collection Time   10/18/11  9:15 PM      Component Value Range Status Comment   Specimen Description BLOOD LEFT HAND   Final    Special Requests BOTTLES DRAWN AEROBIC AND ANAEROBIC 4CC   Final    Culture  Setup Time [id number]   Final    Culture     Final    Value: STAPHYLOCOCCUS AUREUS     Note: SUSCEPTIBILITIES PERFORMED ON PREVIOUS CULTURE WITHIN THE LAST 5 DAYS.     Note: Gram Stain Report Called to,Read Back By and Verified With: RN S. DILLON ON 10/19/11 AT 1840 BY DTERRY   Report Status 10/21/2011 FINAL   Final   CSF CULTURE     Status: Normal   Collection Time   10/18/11 11:45 PM      Component Value Range Status Comment   Specimen Description CSF   Final    Special Requests Normal   Final    Gram Stain     Final    Value: CYTOSPIN SLIDE WBC PRESENT,BOTH PMN AND MONONUCLEAR     NO ORGANISMS SEEN  Gram Stain Report Called to,Read Back By and Verified With: Gram Stain Report Called to,Read Back By and Verified With: TWOODS RN AT 0141 ON [id number] BY DLONG Performed by [facility]   Culture NO GROWTH 3 DAYS   Final    Report Status 10/22/2011 FINAL   Final   GRAM STAIN     Status: Normal   Collection Time   10/18/11 11:45 PM      Component Value Range Status Comment   Specimen Description CSF   Final    Special Requests Normal   Final    Gram Stain     Final    Value: WBC PRESENT,BOTH PMN AND MONONUCLEAR     NO ORGANISMS SEEN     CYTO SPUN SLIDE     Gram Stain Report Called to,Read Back By and Verified With: TWOODS RN AT 0141 ON [id number] BY DLONG   Report Status 10/19/2011 FINAL   Final   MRSA  PCR SCREENING     Status: Normal   Collection Time   10/19/11 12:23 AM      Component Value Range Status Comment   MRSA by PCR NEGATIVE  NEGATIVE  Final   WOUND CULTURE     Status: Normal   Collection Time   10/19/11  8:29 PM      Component Value Range Status Comment   Specimen Description WOUND NECK CERVICAL FOUR FIVE DISC SPACE   Final    Special Requests NONE   Final    Gram Stain     Final    Value: RARE WBC PRESENT, PREDOMINANTLY MONONUCLEAR     NO ORGANISMS SEEN     Performed at [facility]   Culture     Final    Value: MODERATE STAPHYLOCOCCUS AUREUS     Note: SUSCEPTIBILITIES PERFORMED ON PREVIOUS CULTURE WITHIN THE LAST 5 DAYS.   Report Status 10/22/2011 FINAL   Final   AFB CULTURE WITH SMEAR     Status: Normal (Preliminary result)   Collection Time   10/19/11  8:29 PM      Component Value Range Status Comment   Specimen Description WOUND NECK CERVICAL FOUR FIVE DISC SPACE   Final    Special Requests NONE   Final    ACID FAST SMEAR NO ACID FAST BACILLI SEEN   Final    Culture     Final    Value: CULTURE WILL BE EXAMINED FOR 6 WEEKS BEFORE ISSUING A FINAL REPORT   Report Status PENDING   Incomplete   GRAM STAIN     Status: Normal   Collection Time   10/19/11  8:29 PM      Component Value Range Status Comment   Specimen Description WOUND NECK CERVICAL FOUR FIVE DISC SPACE   Final    Special Requests NONE   Final    Gram Stain     Final    Value: RARE WBC PRESENT, PREDOMINANTLY MONONUCLEAR     NO ORGANISMS SEEN   Report Status 10/19/2011 FINAL   Final   ANAEROBIC CULTURE     Status: Normal (Preliminary result)   Collection Time   10/19/11  8:33 PM      Component Value Range Status Comment   Specimen Description NECK CERVICAL FOUR FIVE DISC SPACE   Final    Special Requests NONE   Final    Gram Stain PENDING   Incomplete    Culture     Final    Value: NO  ANAEROBES ISOLATED; CULTURE IN PROGRESS FOR 5 DAYS   Report Status PENDING   Incomplete   TISSUE CULTURE      Status: Normal   Collection Time   10/19/11  8:34 PM      Component Value Range Status Comment   Specimen Description TISSUE NECK CERVICAL FOUR FIVE DISC   Final    Special Requests NONE   Final    Gram Stain     Final    Value: NO WBC SEEN     NO ORGANISMS SEEN     Performed at [facility]   Culture     Final    Value: MODERATE STAPHYLOCOCCUS AUREUS     Note: RIFAMPIN AND GENTAMICIN SHOULD NOT BE USED AS SINGLE DRUGS FOR TREATMENT OF STAPH INFECTIONS.   Report Status 10/22/2011 FINAL   Final    Organism ID, Bacteria STAPHYLOCOCCUS AUREUS   Final   AFB CULTURE WITH SMEAR     Status: Normal (Preliminary result)   Collection Time   10/19/11  8:34 PM      Component Value Range Status Comment   Specimen Description TISSUE NECK CERVICAL FOUR FIVE DISC   Final    Special Requests NONE   Final    ACID FAST SMEAR NO ACID FAST BACILLI SEEN   Final    Culture     Final    Value: CULTURE WILL BE EXAMINED FOR 6 WEEKS BEFORE ISSUING A FINAL REPORT   Report Status PENDING   Incomplete   GRAM STAIN     Status: Normal   Collection Time   10/19/11  8:34 PM      Component Value Range Status Comment   Specimen Description TISSUE NECK CERVICAL FOUR FIVE DISC   Final    Special Requests NONE   Final    Gram Stain     Final    Value: NO WBC SEEN     NO ORGANISMS SEEN   Report Status 10/19/2011 FINAL   Final   ANAEROBIC CULTURE     Status: Normal (Preliminary result)   Collection Time   10/19/11  8:37 PM      Component Value Range Status Comment   Specimen Description TISSUE CERIVAL FOUR FIVE DISC   Final    Special Requests NONE   Final    Gram Stain PENDING   Incomplete    Culture     Final    Value: NO ANAEROBES ISOLATED; CULTURE IN PROGRESS FOR 5 DAYS   Report Status PENDING   Incomplete   WOUND CULTURE     Status: Normal   Collection Time   10/19/11 10:14 PM      Component Value Range Status Comment   Specimen Description WOUND NECK CERVICAL FOUR FIVE EPIDURAL DISC SPACE   Final     Special Requests NONE   Final    Gram Stain     Final    Value: FEW WBC PRESENT,BOTH PMN AND MONONUCLEAR     RARE GRAM POSITIVE COCCI IN PAIRS     Performed at [facility]   Culture     Final    Value: MODERATE STAPHYLOCOCCUS AUREUS     Note: RIFAMPIN AND GENTAMICIN SHOULD NOT BE USED AS SINGLE DRUGS FOR TREATMENT OF STAPH INFECTIONS.   Report Status 10/22/2011 FINAL   Final    Organism ID, Bacteria STAPHYLOCOCCUS AUREUS   Final   ANAEROBIC CULTURE     Status: Normal (Preliminary result)   Collection  Time   10/19/11 10:14 PM      Component Value Range Status Comment   Specimen Description NECK CERVICAL FOUR FIVE EPIDURAL DISC SPACE   Final    Special Requests NONE   Final    Gram Stain PENDING   Incomplete    Culture     Final    Value: NO ANAEROBES ISOLATED; CULTURE IN PROGRESS FOR 5 DAYS   Report Status PENDING   Incomplete   GRAM STAIN     Status: Normal   Collection Time   10/19/11 10:14 PM      Component Value Range Status Comment   Specimen Description NECK CERVICAL FOUR FIVE WPIDURAL DISC SPACE   Final    Special Requests NONE   Final    Gram Stain     Final    Value: FEW WBC PRESENT,BOTH PMN AND MONONUCLEAR     RARE GRAM POSITIVE COCCI IN PAIRS   Report Status 10/19/2011 FINAL   Final   AFB CULTURE WITH SMEAR     Status: Normal (Preliminary result)   Collection Time   10/19/11 10:14 PM      Component Value Range Status Comment   Specimen Description NECK CERVICAL FOUR FIVE WPIDURAL DISC SPACE   Final    Special Requests NONE   Final    ACID FAST SMEAR NO ACID FAST BACILLI SEEN   Final    Culture     Final    Value: CULTURE WILL BE EXAMINED FOR 6 WEEKS BEFORE ISSUING A FINAL REPORT   Report Status PENDING   Incomplete   CULTURE, BLOOD (ROUTINE X 2)     Status: Normal (Preliminary result)   Collection Time   10/20/11  4:40 PM      Component Value Range Status Comment   Specimen Description BLOOD LEFT HAND   Final    Special Requests BOTTLES DRAWN AEROBIC AND  ANAEROBIC 5CC   Final    Culture  Setup Time [id number]   Final    Culture     Final    Value:        BLOOD CULTURE RECEIVED NO GROWTH TO DATE CULTURE WILL BE HELD FOR 5 DAYS BEFORE ISSUING A FINAL NEGATIVE REPORT   Report Status PENDING   Incomplete     Medications:  Scheduled Meds:   . antiseptic oral rinse  15 mL Mouth Rinse QID  .  ceFAZolin (ANCEF) IV  1 g Intravenous Q8H  . chlorhexidine  15 mL Mouth Rinse BID  . feeding supplement  237 mL Oral BID BM  . fentaNYL  250 mcg Intravenous Once  . hydrochlorothiazide  12.5 mg Oral Daily  . insulin aspart  0-15 Units Subcutaneous TID WC  . insulin aspart  0-5 Units Subcutaneous QHS  . insulin aspart  4 Units Subcutaneous TID WC  . insulin glargine  5 Units Subcutaneous QHS  . midazolam  10 mg Intravenous Once  . polyethylene glycol  17 g Oral Daily  . senna-docusate  2 tablet Oral BID  . sodium chloride  500 mL Intravenous Once  . DISCONTD: ALPRAZolam  1 mg Oral TID  . DISCONTD: lisinopril  10 mg Oral Daily  . DISCONTD: sodium chloride  3 mL Intravenous Q12H   Continuous Infusions:   . sodium chloride    . DISCONTD: sodium chloride    . DISCONTD: sodium chloride     PRN Meds:.acetaminophen, acetaminophen, ALPRAZolam, benzocaine, fentaNYL, Glycerin (Adult), menthol-cetylpyridinium, ondansetron (ZOFRAN) IV, oxyCODONE, phenol, DISCONTD: sodium  chloride, DISCONTD: sodium chloride  Assessment/Plan:  Acute Renal Failure / Dehydration -bolus IVFs and start NS (see orders) -DC lisinopril  C4-C5, C5-C6 compression. Lt hemiparesis  -Post surgical decompression Patent examiner)  - Cont analgesia - see orders  -rehab CIR consult pending  Anxiety Disorder - changing alprazolam to 0.5 mg TID prn only, hold for sedation  Staph bacteremia/cervical discitis/epidural abscess  Cultures:  Blood cultures 6/1: Staph aureus  CSF 6/1: ng  Wound cx 6/2 - staph aureus  Continue cefazolin IV  TEE planned for today   Constipation  -continue  laxative therapy  Respiratory failure, s/p intubation for airway protection - resolved  Constipation  - intensify medical therapy, see orders   Mild ventricular and atrial ectopy - Cont telemetry   Mild thrombocytopenia - resolved - SCDs for DVT prophy   Hyperglycemia - controlled  - Continue SSI and lantus   LOS: 5 days   [place] 10/23/2011, 11:58 AM   Cleora Fleet, MD, CDE, FAAFP [facility] [place], [place]  [phone]

## 2011-10-23 NOTE — Interval H&P Note (Signed)
History and Physical Interval Note:  10/23/2011 2:17 PM  Charles Ball  has presented today for TEE, with the diagnosis of bacteremia   The various methods of treatment have been discussed with the patient and family. After consideration of risks, benefits and other options for treatment, the patient has consented to  Procedure(s) (LRB): TRANSESOPHAGEAL ECHOCARDIOGRAM (TEE) (N/A) as a surgical intervention .  The patients' history has been reviewed, patient examined, no change in status, stable for surgery.  I have reviewed the patients' chart and labs.  Questions were answered to the patient's satisfaction.     Theron [facility] 10/23/2011 2:17 PM

## 2011-10-23 NOTE — Progress Notes (Signed)
Wasted 6 mg of versed and 50 mcg of fentynl with S. Cockeran. Unable to waste in pyxis due to patient name being removed.

## 2011-10-23 NOTE — Progress Notes (Addendum)
INFECTIOUS DISEASE PROGRESS NOTE  ID: Charles Ball is a 61 y.o. male with   Active Problems:  Acute respiratory failure  Cervical (neck) region somatic dysfunction  Epidural abscess  Thrombocytopenia  Hyperglycemia  Bacteremia due to Staphylococcus  Subjective: Groggy post TEE  Abtx:  Anti-infectives     Start     Dose/Rate Route Frequency Ordered Stop   10/21/11 1400   ceFAZolin (ANCEF) IVPB 1 g/50 mL premix        1 g 100 mL/hr over 30 Minutes Intravenous 3 times per day 10/21/11 1202     10/19/11 2007   bacitracin 81191 UNITS injection     Comments: [name], [name]: cabinet override         10/19/11 2007 10/20/11 0814   10/19/11 2005   bacitracin 50,000 Units in sodium chloride irrigation 0.9 % 500 mL irrigation  Status:  Discontinued          As needed 10/19/11 2058 10/19/11 2209   10/19/11 1200   vancomycin (VANCOCIN) 1,250 mg in sodium chloride 0.9 % 250 mL IVPB  Status:  Discontinued        1,250 mg 166.7 mL/hr over 90 Minutes Intravenous Every 12 hours 10/18/11 2157 10/21/11 1202   10/19/11 0200   acyclovir (ZOVIRAX) 930 mg in dextrose 5 % 150 mL IVPB  Status:  Discontinued        10 mg/kg  92.8 kg (Adjusted) 168.6 mL/hr over 60 Minutes Intravenous 3 times per day 10/19/11 0156 10/20/11 1607   10/18/11 2230   ampicillin (OMNIPEN) 2 g in sodium chloride 0.9 % 50 mL IVPB  Status:  Discontinued        2 g 150 mL/hr over 20 Minutes Intravenous Every 4 hours 10/18/11 2130 10/20/11 1607   10/18/11 2200   cefTRIAXone (ROCEPHIN) 2 g in dextrose 5 % 50 mL IVPB  Status:  Discontinued        2 g 100 mL/hr over 30 Minutes Intravenous Every 12 hours 10/18/11 2130 10/20/11 1607   10/18/11 2200   vancomycin (VANCOCIN) IVPB 1000 mg/200 mL premix        1,000 mg 200 mL/hr over 60 Minutes Intravenous  Once 10/18/11 2156 10/19/11 0258   10/18/11 2045   piperacillin-tazobactam (ZOSYN) IVPB 3.375 g  Status:  Discontinued        3.375 g 100 mL/hr over 30 Minutes Intravenous   Once 10/18/11 2029 10/18/11 2204   10/18/11 2045   vancomycin (VANCOCIN) IVPB 1000 mg/200 mL premix        1,000 mg 200 mL/hr over 60 Minutes Intravenous  Once 10/18/11 2029 10/18/11 2235          Medications:  Scheduled:   . antiseptic oral rinse  15 mL Mouth Rinse QID  .  ceFAZolin (ANCEF) IV  1 g Intravenous Q8H  . chlorhexidine  15 mL Mouth Rinse BID  . feeding supplement  237 mL Oral BID BM  . hydrochlorothiazide  12.5 mg Oral Daily  . insulin aspart  0-15 Units Subcutaneous TID WC  . insulin aspart  0-5 Units Subcutaneous QHS  . insulin aspart  4 Units Subcutaneous TID WC  . insulin glargine  5 Units Subcutaneous QHS  . polyethylene glycol  17 g Oral Daily  . senna-docusate  2 tablet Oral BID  . sodium chloride  500 mL Intravenous Once  . DISCONTD: ALPRAZolam  1 mg Oral TID  . DISCONTD: fentaNYL  250 mcg Intravenous Once  . DISCONTD: lisinopril  10 mg Oral Daily  . DISCONTD: midazolam  10 mg Intravenous Once  . DISCONTD: sodium chloride  3 mL Intravenous Q12H    Objective: Vital signs in last 24 hours: Temp:  [97.8 F (36.6 C)-98.8 F (37.1 C)] 98.8 F (37.1 C) (06/06 1651) Pulse Rate:  [80-94] 80  (06/06 1651) Resp:  [15-33] 18  (06/06 1651) BP: (111-175)/(59-123) 117/70 mmHg (06/06 1651) SpO2:  [90 %-97 %] 95 % (06/06 1651) Weight:  [114.6 kg (252 lb 10.4 oz)] 114.6 kg (252 lb 10.4 oz) (06/05 2018)   General appearance: groggy post TEE attempt Resp: clear to auscultation bilaterally Cardio: regular rate and rhythm GI: normal findings: bowel sounds normal and soft, non-tender and abnormal findings:  distended Neurologic: Motor: unchanged  Lab Results  Basename 10/23/11 0635 10/21/11 0350  WBC 20.9* 10.8*  HGB 13.7 12.2*  HCT 40.0 35.2*  NA 133* 139  K 4.0 4.0  CL 97 106  CO2 21 24  BUN 57* 30*  CREATININE 3.39* 0.78  GLU -- --   Liver Panel  Basename 10/23/11 0635  PROT 5.8*  ALBUMIN 2.1*  AST 29  ALT 22  ALKPHOS 59  BILITOT 0.4    BILIDIR --  IBILI --   Sedimentation Rate No results found for this basename: ESRSEDRATE in the last 72 hours C-Reactive Protein No results found for this basename: CRP:2 in the last 72 hours  Microbiology: Recent Results (from the past 240 hour(s))  CULTURE, BLOOD (ROUTINE X 2)     Status: Normal   Collection Time   10/18/11  6:55 PM      Component Value Range Status Comment   Specimen Description BLOOD RIGHT ARM  5 ML IN [facility] BOTTLE   Final    Special Requests NONE   Final    Culture  Setup Time [id number]   Final    Culture     Final    Value: STAPHYLOCOCCUS AUREUS     Note: RIFAMPIN AND GENTAMICIN SHOULD NOT BE USED AS SINGLE DRUGS FOR TREATMENT OF STAPH INFECTIONS.     Note: Gram Stain Report Called to,Read Back By and Verified With: RN S. DILLON ON 10/19/11 AT 1500 BY DTERRY   Report Status 10/21/2011 FINAL   Final    Organism ID, Bacteria STAPHYLOCOCCUS AUREUS   Final   URINE CULTURE     Status: Normal   Collection Time   10/18/11  8:14 PM      Component Value Range Status Comment   Specimen Description URINE, CATHETERIZED   Final    Special Requests NONE   Final    Culture  Setup Time [id number]   Final    Colony Count NO GROWTH   Final    Culture NO GROWTH   Final    Report Status 10/20/2011 FINAL   Final   CULTURE, BLOOD (ROUTINE X 2)     Status: Normal   Collection Time   10/18/11  9:15 PM      Component Value Range Status Comment   Specimen Description BLOOD LEFT HAND   Final    Special Requests BOTTLES DRAWN AEROBIC AND ANAEROBIC 4CC   Final    Culture  Setup Time [id number]   Final    Culture     Final    Value: STAPHYLOCOCCUS AUREUS     Note: SUSCEPTIBILITIES PERFORMED ON PREVIOUS CULTURE WITHIN THE LAST 5 DAYS.     Note: Gram Stain Report Called to,Read Back By and Verified With: RN  S. DILLON ON 10/19/11 AT 1840 BY DTERRY   Report Status 10/21/2011 FINAL   Final   CSF CULTURE     Status: Normal   Collection Time   10/18/11 11:45 PM      Component Value  Range Status Comment   Specimen Description CSF   Final    Special Requests Normal   Final    Gram Stain     Final    Value: CYTOSPIN SLIDE WBC PRESENT,BOTH PMN AND MONONUCLEAR     NO ORGANISMS SEEN     Gram Stain Report Called to,Read Back By and Verified With: Gram Stain Report Called to,Read Back By and Verified With: TWOODS RN AT 0141 ON [id number] BY DLONG Performed by [facility]   Culture NO GROWTH 3 DAYS   Final    Report Status 10/22/2011 FINAL   Final   GRAM STAIN     Status: Normal   Collection Time   10/18/11 11:45 PM      Component Value Range Status Comment   Specimen Description CSF   Final    Special Requests Normal   Final    Gram Stain     Final    Value: WBC PRESENT,BOTH PMN AND MONONUCLEAR     NO ORGANISMS SEEN     CYTO SPUN SLIDE     Gram Stain Report Called to,Read Back By and Verified With: TWOODS RN AT 0141 ON [id number] BY DLONG   Report Status 10/19/2011 FINAL   Final   MRSA PCR SCREENING     Status: Normal   Collection Time   10/19/11 12:23 AM      Component Value Range Status Comment   MRSA by PCR NEGATIVE  NEGATIVE  Final   WOUND CULTURE     Status: Normal   Collection Time   10/19/11  8:29 PM      Component Value Range Status Comment   Specimen Description WOUND NECK CERVICAL FOUR FIVE DISC SPACE   Final    Special Requests NONE   Final    Gram Stain     Final    Value: RARE WBC PRESENT, PREDOMINANTLY MONONUCLEAR     NO ORGANISMS SEEN     Performed at [facility]   Culture     Final    Value: MODERATE STAPHYLOCOCCUS AUREUS     Note: SUSCEPTIBILITIES PERFORMED ON PREVIOUS CULTURE WITHIN THE LAST 5 DAYS.   Report Status 10/22/2011 FINAL   Final   AFB CULTURE WITH SMEAR     Status: Normal (Preliminary result)   Collection Time   10/19/11  8:29 PM      Component Value Range Status Comment   Specimen Description WOUND NECK CERVICAL FOUR FIVE DISC SPACE   Final    Special Requests NONE   Final    ACID FAST SMEAR NO ACID FAST BACILLI SEEN   Final     Culture     Final    Value: CULTURE WILL BE EXAMINED FOR 6 WEEKS BEFORE ISSUING A FINAL REPORT   Report Status PENDING   Incomplete   GRAM STAIN     Status: Normal   Collection Time   10/19/11  8:29 PM      Component Value Range Status Comment   Specimen Description WOUND NECK CERVICAL FOUR FIVE DISC SPACE   Final    Special Requests NONE   Final    Gram Stain     Final    Value: RARE WBC  PRESENT, PREDOMINANTLY MONONUCLEAR     NO ORGANISMS SEEN   Report Status 10/19/2011 FINAL   Final   ANAEROBIC CULTURE     Status: Normal (Preliminary result)   Collection Time   10/19/11  8:33 PM      Component Value Range Status Comment   Specimen Description NECK CERVICAL FOUR FIVE DISC SPACE   Final    Special Requests NONE   Final    Gram Stain PENDING   Incomplete    Culture     Final    Value: NO ANAEROBES ISOLATED; CULTURE IN PROGRESS FOR 5 DAYS   Report Status PENDING   Incomplete   TISSUE CULTURE     Status: Normal   Collection Time   10/19/11  8:34 PM      Component Value Range Status Comment   Specimen Description TISSUE NECK CERVICAL FOUR FIVE DISC   Final    Special Requests NONE   Final    Gram Stain     Final    Value: NO WBC SEEN     NO ORGANISMS SEEN     Performed at [facility]   Culture     Final    Value: MODERATE STAPHYLOCOCCUS AUREUS     Note: RIFAMPIN AND GENTAMICIN SHOULD NOT BE USED AS SINGLE DRUGS FOR TREATMENT OF STAPH INFECTIONS.   Report Status 10/22/2011 FINAL   Final    Organism ID, Bacteria STAPHYLOCOCCUS AUREUS   Final   AFB CULTURE WITH SMEAR     Status: Normal (Preliminary result)   Collection Time   10/19/11  8:34 PM      Component Value Range Status Comment   Specimen Description TISSUE NECK CERVICAL FOUR FIVE DISC   Final    Special Requests NONE   Final    ACID FAST SMEAR NO ACID FAST BACILLI SEEN   Final    Culture     Final    Value: CULTURE WILL BE EXAMINED FOR 6 WEEKS BEFORE ISSUING A FINAL REPORT   Report Status PENDING   Incomplete    GRAM STAIN     Status: Normal   Collection Time   10/19/11  8:34 PM      Component Value Range Status Comment   Specimen Description TISSUE NECK CERVICAL FOUR FIVE DISC   Final    Special Requests NONE   Final    Gram Stain     Final    Value: NO WBC SEEN     NO ORGANISMS SEEN   Report Status 10/19/2011 FINAL   Final   ANAEROBIC CULTURE     Status: Normal (Preliminary result)   Collection Time   10/19/11  8:37 PM      Component Value Range Status Comment   Specimen Description TISSUE CERIVAL FOUR FIVE DISC   Final    Special Requests NONE   Final    Gram Stain PENDING   Incomplete    Culture     Final    Value: NO ANAEROBES ISOLATED; CULTURE IN PROGRESS FOR 5 DAYS   Report Status PENDING   Incomplete   WOUND CULTURE     Status: Normal   Collection Time   10/19/11 10:14 PM      Component Value Range Status Comment   Specimen Description WOUND NECK CERVICAL FOUR FIVE EPIDURAL DISC SPACE   Final    Special Requests NONE   Final    Gram Stain     Final    Value:  FEW WBC PRESENT,BOTH PMN AND MONONUCLEAR     RARE GRAM POSITIVE COCCI IN PAIRS     Performed at [facility]   Culture     Final    Value: MODERATE STAPHYLOCOCCUS AUREUS     Note: RIFAMPIN AND GENTAMICIN SHOULD NOT BE USED AS SINGLE DRUGS FOR TREATMENT OF STAPH INFECTIONS.   Report Status 10/22/2011 FINAL   Final    Organism ID, Bacteria STAPHYLOCOCCUS AUREUS   Final   ANAEROBIC CULTURE     Status: Normal (Preliminary result)   Collection Time   10/19/11 10:14 PM      Component Value Range Status Comment   Specimen Description NECK CERVICAL FOUR FIVE EPIDURAL DISC SPACE   Final    Special Requests NONE   Final    Gram Stain PENDING   Incomplete    Culture     Final    Value: NO ANAEROBES ISOLATED; CULTURE IN PROGRESS FOR 5 DAYS   Report Status PENDING   Incomplete   GRAM STAIN     Status: Normal   Collection Time   10/19/11 10:14 PM      Component Value Range Status Comment   Specimen Description NECK CERVICAL FOUR  FIVE WPIDURAL DISC SPACE   Final    Special Requests NONE   Final    Gram Stain     Final    Value: FEW WBC PRESENT,BOTH PMN AND MONONUCLEAR     RARE GRAM POSITIVE COCCI IN PAIRS   Report Status 10/19/2011 FINAL   Final   AFB CULTURE WITH SMEAR     Status: Normal (Preliminary result)   Collection Time   10/19/11 10:14 PM      Component Value Range Status Comment   Specimen Description NECK CERVICAL FOUR FIVE WPIDURAL DISC SPACE   Final    Special Requests NONE   Final    ACID FAST SMEAR NO ACID FAST BACILLI SEEN   Final    Culture     Final    Value: CULTURE WILL BE EXAMINED FOR 6 WEEKS BEFORE ISSUING A FINAL REPORT   Report Status PENDING   Incomplete   CULTURE, BLOOD (ROUTINE X 2)     Status: Normal (Preliminary result)   Collection Time   10/20/11  4:40 PM      Component Value Range Status Comment   Specimen Description BLOOD LEFT HAND   Final    Special Requests BOTTLES DRAWN AEROBIC AND ANAEROBIC 5CC   Final    Culture  Setup Time [id number]   Final    Culture     Final    Value:        BLOOD CULTURE RECEIVED NO GROWTH TO DATE CULTURE WILL BE HELD FOR 5 DAYS BEFORE ISSUING A FINAL NEGATIVE REPORT   Report Status PENDING   Incomplete     Studies/Results: No results found.   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 TEE difficulties noted. To have repeat attempt.   Repeat BCx pending.  Appreciate rehab eval.  Bowel hygiene - wife manually disimpacted last pm. Needs repeat Labs- his WBC and Cr have come up dramatically  Johny Sax Infectious Diseases [phone] 10/23/2011, 5:28 PM   LOS: 5 days

## 2011-10-23 NOTE — Clinical Social Work Note (Signed)
CSW talked with inpatient Inpatient Rehab Coordinator Ottie Glazier regarding status of inpatient rehab evaluation. CIR has evaluated patient but cannot accept him as patient will not have appropriate supports at home after d/c from inpatient rehab. CIR coordinator talked with patient's live-in girlfriend Rod Mae, and she is in agreement with SNF for rehab as she works and patient's children work. CSW attempted to reach girlfriend on both of her cell phones and her home phone to discuss SNF search and messages left.  FL-2 completed and placed in shadow chart for MD signature. CSW will continue attempts to reach girlfriend to conduct assessment.  Genelle Bal, MSW, LCSW [phone]

## 2011-10-23 NOTE — Progress Notes (Signed)
Procedure canceled Dr [place] and Dr Rhea Belton unable to advance scope

## 2011-10-23 NOTE — Progress Notes (Signed)
Pt has been showing increased increased chest congestion and a weak cough, since returning from Endo. Pt's O2 sats are WNL on 3L, and his breathing isn't labored, but his lungs seem to be getting more congested. MD notified. Orders received.

## 2011-10-24 ENCOUNTER — Inpatient Hospital Stay (HOSPITAL_COMMUNITY): Payer: Medicaid Other

## 2011-10-24 ENCOUNTER — Encounter (HOSPITAL_COMMUNITY): Payer: Self-pay | Admitting: Cardiology

## 2011-10-24 DIAGNOSIS — G061 Intraspinal abscess and granuloma: Secondary | ICD-10-CM

## 2011-10-24 DIAGNOSIS — G959 Disease of spinal cord, unspecified: Secondary | ICD-10-CM

## 2011-10-24 DIAGNOSIS — R7881 Bacteremia: Secondary | ICD-10-CM

## 2011-10-24 DIAGNOSIS — R52 Pain, unspecified: Secondary | ICD-10-CM

## 2011-10-24 LAB — BASIC METABOLIC PANEL
CO2: 21 mEq/L (ref 19–32)
Glucose, Bld: 120 mg/dL — ABNORMAL HIGH (ref 70–99)
Potassium: 4.2 mEq/L (ref 3.5–5.1)
Sodium: 134 mEq/L — ABNORMAL LOW (ref 135–145)

## 2011-10-24 LAB — COMPREHENSIVE METABOLIC PANEL
ALT: <5 U/L (ref 0–53)
Alkaline Phosphatase: 50 U/L (ref 39–117)
BUN: 89 mg/dL — ABNORMAL HIGH (ref 6–23)
CO2: 19 mEq/L (ref 19–32)
Calcium: 7.6 mg/dL — ABNORMAL LOW (ref 8.4–10.5)
GFR calc Af Amer: 11 mL/min — ABNORMAL LOW (ref >90)
GFR calc non Af Amer: 9 mL/min — ABNORMAL LOW (ref >90)
Glucose, Bld: 116 mg/dL — ABNORMAL HIGH (ref 70–99)
Sodium: 134 mEq/L — ABNORMAL LOW (ref 135–145)

## 2011-10-24 LAB — BLOOD GAS, ARTERIAL
Bicarbonate: 19.5 mEq/L — ABNORMAL LOW (ref 20.0–24.0)
O2 Saturation: 97.4 %
Patient temperature: 98.6
TCO2: 20.5 mmol/L (ref 0–100)

## 2011-10-24 LAB — CBC
HCT: 35.6 % — ABNORMAL LOW (ref 39.0–52.0)
Hemoglobin: 12.5 g/dL — ABNORMAL LOW (ref 13.0–17.0)
MCHC: 35.1 g/dL (ref 30.0–36.0)
MCV: 86 fL (ref 78.0–100.0)
RDW: 13.8 % (ref 11.5–15.5)

## 2011-10-24 LAB — GLUCOSE, CAPILLARY: Glucose-Capillary: 110 mg/dL — ABNORMAL HIGH (ref 70–99)

## 2011-10-24 LAB — PRO B NATRIURETIC PEPTIDE: Pro B Natriuretic peptide (BNP): 226.7 pg/mL — ABNORMAL HIGH (ref 0–125)

## 2011-10-24 MED ORDER — FUROSEMIDE 10 MG/ML IJ SOLN
40.0000 mg | Freq: Three times a day (TID) | INTRAMUSCULAR | Status: AC
  Administered 2011-10-24 (×2): 40 mg via INTRAVENOUS
  Filled 2011-10-24 (×2): qty 4

## 2011-10-24 MED ORDER — INSULIN ASPART 100 UNIT/ML ~~LOC~~ SOLN
0.0000 [IU] | SUBCUTANEOUS | Status: DC
  Administered 2011-10-25 – 2011-10-26 (×3): 2 [IU] via SUBCUTANEOUS

## 2011-10-24 MED ORDER — CEFAZOLIN SODIUM 1-5 GM-% IV SOLN
1.0000 g | Freq: Two times a day (BID) | INTRAVENOUS | Status: DC
  Administered 2011-10-24: 1 g via INTRAVENOUS
  Filled 2011-10-24 (×3): qty 50

## 2011-10-24 MED ORDER — HYDROMORPHONE HCL PF 1 MG/ML IJ SOLN
0.5000 mg | INTRAMUSCULAR | Status: DC | PRN
  Administered 2011-10-24 – 2011-10-25 (×2): 0.5 mg via INTRAVENOUS
  Administered 2011-10-25: 10:00:00 via INTRAVENOUS
  Administered 2011-10-26 (×2): 0.5 mg via INTRAVENOUS
  Filled 2011-10-24 (×5): qty 1

## 2011-10-24 NOTE — Progress Notes (Signed)
INFECTIOUS DISEASE PROGRESS NOTE  ID: Charles Ball is a 61 y.o. male with   Active Problems:  Acute respiratory failure  Cervical (neck) region somatic dysfunction  Epidural abscess  Thrombocytopenia  Hyperglycemia  Bacteremia due to Staphylococcus  Subjective: Resting quietly  Abtx:  Anti-infectives     Start     Dose/Rate Route Frequency Ordered Stop   10/23/11 2200   ceFAZolin (ANCEF) IVPB 1 g/50 mL premix        1 g 100 mL/hr over 30 Minutes Intravenous Every 12 hours 10/23/11 1739     10/21/11 1400   ceFAZolin (ANCEF) IVPB 1 g/50 mL premix  Status:  Discontinued        1 g 100 mL/hr over 30 Minutes Intravenous 3 times per day 10/21/11 1202 10/23/11 1739   10/19/11 2007   bacitracin 16109 UNITS injection     Comments: Charles Ball: cabinet override         10/19/11 2007 10/20/11 0814   10/19/11 2005   bacitracin 50,000 Units in sodium chloride irrigation 0.9 % 500 mL irrigation  Status:  Discontinued          As needed 10/19/11 2058 10/19/11 2209   10/19/11 1200   vancomycin (VANCOCIN) 1,250 mg in sodium chloride 0.9 % 250 mL IVPB  Status:  Discontinued        1,250 mg 166.7 mL/hr over 90 Minutes Intravenous Every 12 hours 10/18/11 2157 10/21/11 1202   10/19/11 0200   acyclovir (ZOVIRAX) 930 mg in dextrose 5 % 150 mL IVPB  Status:  Discontinued        10 mg/kg  92.8 kg (Adjusted) 168.6 mL/hr over 60 Minutes Intravenous 3 times per day 10/19/11 0156 10/20/11 1607   10/18/11 2230   ampicillin (OMNIPEN) 2 g in sodium chloride 0.9 % 50 mL IVPB  Status:  Discontinued        2 g 150 mL/hr over 20 Minutes Intravenous Every 4 hours 10/18/11 2130 10/20/11 1607   10/18/11 2200   cefTRIAXone (ROCEPHIN) 2 g in dextrose 5 % 50 mL IVPB  Status:  Discontinued        2 g 100 mL/hr over 30 Minutes Intravenous Every 12 hours 10/18/11 2130 10/20/11 1607   10/18/11 2200   vancomycin (VANCOCIN) IVPB 1000 mg/200 mL premix        1,000 mg 200 mL/hr over 60 Minutes Intravenous   Once 10/18/11 2156 10/19/11 0258   10/18/11 2045   piperacillin-tazobactam (ZOSYN) IVPB 3.375 g  Status:  Discontinued        3.375 g 100 mL/hr over 30 Minutes Intravenous  Once 10/18/11 2029 10/18/11 2204   10/18/11 2045   vancomycin (VANCOCIN) IVPB 1000 mg/200 mL premix        1,000 mg 200 mL/hr over 60 Minutes Intravenous  Once 10/18/11 2029 10/18/11 2235          Medications:  Scheduled:   . antiseptic oral rinse  15 mL Mouth Rinse QID  .  ceFAZolin (ANCEF) IV  1 g Intravenous Q12H  . chlorhexidine  15 mL Mouth Rinse BID  . feeding supplement  237 mL Oral BID BM  . furosemide  40 mg Intravenous Q8H  . hydrochlorothiazide  12.5 mg Oral Daily  . insulin aspart  0-15 Units Subcutaneous TID WC  . insulin aspart  0-5 Units Subcutaneous QHS  . insulin aspart  4 Units Subcutaneous TID WC  . insulin glargine  5 Units Subcutaneous QHS  .  polyethylene glycol  17 g Oral BID  . senna-docusate  2 tablet Oral BID  . DISCONTD:  ceFAZolin (ANCEF) IV  1 g Intravenous Q8H  . DISCONTD: polyethylene glycol  17 g Oral Daily    Objective: Vital signs in last 24 hours: Temp:  [97.4 F (36.3 C)-98.4 F (36.9 C)] 97.4 F (36.3 C) (06/07 1600) Pulse Rate:  [72-86] 72  (06/07 1615) Resp:  [12-20] 13  (06/07 1615) BP: (87-130)/(45-74) 92/49 mmHg (06/07 1615) SpO2:  [92 %-99 %] 95 % (06/07 1615) Weight:  [116.4 kg (256 lb 9.9 oz)-117.4 kg (258 lb 13.1 oz)] 117.4 kg (258 lb 13.1 oz) (06/07 1430)   General appearance: no distress Resp: rhonchi anterior - R > L Cardio: regular rate and rhythm GI: normal findings: bowel sounds normal and soft, non-tender Skin: abrasino to skin on anterior abdomen Incision/Wound: neck wound appears clean.   Lab Results  Basename 10/24/11 1100 10/24/11 0810 10/23/11 0635  WBC -- 15.1* 20.9*  HGB -- 12.5* 13.7  HCT -- 35.6* 40.0  NA 134* -- 133*  K 4.0 -- 4.0  CL 99 -- 97  CO2 19 -- 21  BUN 89* -- 57*  CREATININE 5.96* -- 3.39*  GLU -- -- --    Liver Panel  Basename 10/24/11 1100 10/23/11 0635  PROT 5.6* 5.8*  ALBUMIN 1.9* 2.1*  AST 18 29  ALT <5 22  ALKPHOS 50 59  BILITOT 0.3 0.4  BILIDIR -- --  IBILI -- --   Sedimentation Rate No results found for this basename: ESRSEDRATE in the last 72 hours C-Reactive Protein No results found for this basename: CRP:2 in the last 72 hours  Microbiology: Recent Results (from the past 240 hour(s))  CULTURE, BLOOD (ROUTINE X 2)     Status: Normal   Collection Time   10/18/11  6:55 PM      Component Value Range Status Comment   Specimen Description BLOOD RIGHT ARM  5 ML IN [facility] BOTTLE   Final    Special Requests NONE   Final    Culture  Setup Time [id number]   Final    Culture     Final    Value: STAPHYLOCOCCUS AUREUS     Note: RIFAMPIN AND GENTAMICIN SHOULD NOT BE USED AS SINGLE DRUGS FOR TREATMENT OF STAPH INFECTIONS.     Note: Gram Stain Report Called to,Read Back By and Verified With: RN S. DILLON ON 10/19/11 AT 1500 BY DTERRY   Report Status 10/21/2011 FINAL   Final    Organism ID, Bacteria STAPHYLOCOCCUS AUREUS   Final   URINE CULTURE     Status: Normal   Collection Time   10/18/11  8:14 PM      Component Value Range Status Comment   Specimen Description URINE, CATHETERIZED   Final    Special Requests NONE   Final    Culture  Setup Time [id number]   Final    Colony Count NO GROWTH   Final    Culture NO GROWTH   Final    Report Status 10/20/2011 FINAL   Final   CULTURE, BLOOD (ROUTINE X 2)     Status: Normal   Collection Time   10/18/11  9:15 PM      Component Value Range Status Comment   Specimen Description BLOOD LEFT HAND   Final    Special Requests BOTTLES DRAWN AEROBIC AND ANAEROBIC 4CC   Final    Culture  Setup Time [id number]   Final  Culture     Final    Value: STAPHYLOCOCCUS AUREUS     Note: SUSCEPTIBILITIES PERFORMED ON PREVIOUS CULTURE WITHIN THE LAST 5 DAYS.     Note: Gram Stain Report Called to,Read Back By and Verified With: RN S. DILLON ON 10/19/11  AT 1840 BY DTERRY   Report Status 10/21/2011 FINAL   Final   CSF CULTURE     Status: Normal   Collection Time   10/18/11 11:45 PM      Component Value Range Status Comment   Specimen Description CSF   Final    Special Requests Normal   Final    Gram Stain     Final    Value: CYTOSPIN SLIDE WBC PRESENT,BOTH PMN AND MONONUCLEAR     NO ORGANISMS SEEN     Gram Stain Report Called to,Read Back By and Verified With: Gram Stain Report Called to,Read Back By and Verified With: TWOODS RN AT 0141 ON [id number] BY DLONG Performed by [facility]   Culture NO GROWTH 3 DAYS   Final    Report Status 10/22/2011 FINAL   Final   GRAM STAIN     Status: Normal   Collection Time   10/18/11 11:45 PM      Component Value Range Status Comment   Specimen Description CSF   Final    Special Requests Normal   Final    Gram Stain     Final    Value: WBC PRESENT,BOTH PMN AND MONONUCLEAR     NO ORGANISMS SEEN     CYTO SPUN SLIDE     Gram Stain Report Called to,Read Back By and Verified With: TWOODS RN AT 0141 ON [id number] BY DLONG   Report Status 10/19/2011 FINAL   Final   MRSA PCR SCREENING     Status: Normal   Collection Time   10/19/11 12:23 AM      Component Value Range Status Comment   MRSA by PCR NEGATIVE  NEGATIVE  Final   WOUND CULTURE     Status: Normal   Collection Time   10/19/11  8:29 PM      Component Value Range Status Comment   Specimen Description WOUND NECK CERVICAL FOUR FIVE DISC SPACE   Final    Special Requests NONE   Final    Gram Stain     Final    Value: RARE WBC PRESENT, PREDOMINANTLY MONONUCLEAR     NO ORGANISMS SEEN     Performed at [facility]   Culture     Final    Value: MODERATE STAPHYLOCOCCUS AUREUS     Note: SUSCEPTIBILITIES PERFORMED ON PREVIOUS CULTURE WITHIN THE LAST 5 DAYS.   Report Status 10/22/2011 FINAL   Final   AFB CULTURE WITH SMEAR     Status: Normal (Preliminary result)   Collection Time   10/19/11  8:29 PM      Component Value Range Status Comment    Specimen Description WOUND NECK CERVICAL FOUR FIVE DISC SPACE   Final    Special Requests NONE   Final    ACID FAST SMEAR NO ACID FAST BACILLI SEEN   Final    Culture     Final    Value: CULTURE WILL BE EXAMINED FOR 6 WEEKS BEFORE ISSUING A FINAL REPORT   Report Status PENDING   Incomplete   GRAM STAIN     Status: Normal   Collection Time   10/19/11  8:29 PM      Component  Value Range Status Comment   Specimen Description WOUND NECK CERVICAL FOUR FIVE DISC SPACE   Final    Special Requests NONE   Final    Gram Stain     Final    Value: RARE WBC PRESENT, PREDOMINANTLY MONONUCLEAR     NO ORGANISMS SEEN   Report Status 10/19/2011 FINAL   Final   ANAEROBIC CULTURE     Status: Normal (Preliminary result)   Collection Time   10/19/11  8:33 PM      Component Value Range Status Comment   Specimen Description NECK CERVICAL FOUR FIVE DISC SPACE   Final    Special Requests NONE   Final    Gram Stain PENDING   Incomplete    Culture     Final    Value: NO ANAEROBES ISOLATED; CULTURE IN PROGRESS FOR 5 DAYS   Report Status PENDING   Incomplete   TISSUE CULTURE     Status: Normal   Collection Time   10/19/11  8:34 PM      Component Value Range Status Comment   Specimen Description TISSUE NECK CERVICAL FOUR FIVE DISC   Final    Special Requests NONE   Final    Gram Stain     Final    Value: NO WBC SEEN     NO ORGANISMS SEEN     Performed at [facility]   Culture     Final    Value: MODERATE STAPHYLOCOCCUS AUREUS     Note: RIFAMPIN AND GENTAMICIN SHOULD NOT BE USED AS SINGLE DRUGS FOR TREATMENT OF STAPH INFECTIONS.   Report Status 10/22/2011 FINAL   Final    Organism ID, Bacteria STAPHYLOCOCCUS AUREUS   Final   AFB CULTURE WITH SMEAR     Status: Normal (Preliminary result)   Collection Time   10/19/11  8:34 PM      Component Value Range Status Comment   Specimen Description TISSUE NECK CERVICAL FOUR FIVE DISC   Final    Special Requests NONE   Final    ACID FAST SMEAR NO ACID FAST  BACILLI SEEN   Final    Culture     Final    Value: CULTURE WILL BE EXAMINED FOR 6 WEEKS BEFORE ISSUING A FINAL REPORT   Report Status PENDING   Incomplete   GRAM STAIN     Status: Normal   Collection Time   10/19/11  8:34 PM      Component Value Range Status Comment   Specimen Description TISSUE NECK CERVICAL FOUR FIVE DISC   Final    Special Requests NONE   Final    Gram Stain     Final    Value: NO WBC SEEN     NO ORGANISMS SEEN   Report Status 10/19/2011 FINAL   Final   ANAEROBIC CULTURE     Status: Normal (Preliminary result)   Collection Time   10/19/11  8:37 PM      Component Value Range Status Comment   Specimen Description TISSUE CERIVAL FOUR FIVE DISC   Final    Special Requests NONE   Final    Gram Stain PENDING   Incomplete    Culture     Final    Value: NO ANAEROBES ISOLATED; CULTURE IN PROGRESS FOR 5 DAYS   Report Status PENDING   Incomplete   WOUND CULTURE     Status: Normal   Collection Time   10/19/11 10:14 PM  Component Value Range Status Comment   Specimen Description WOUND NECK CERVICAL FOUR FIVE EPIDURAL DISC SPACE   Final    Special Requests NONE   Final    Gram Stain     Final    Value: FEW WBC PRESENT,BOTH PMN AND MONONUCLEAR     RARE GRAM POSITIVE COCCI IN PAIRS     Performed at [facility]   Culture     Final    Value: MODERATE STAPHYLOCOCCUS AUREUS     Note: RIFAMPIN AND GENTAMICIN SHOULD NOT BE USED AS SINGLE DRUGS FOR TREATMENT OF STAPH INFECTIONS.   Report Status 10/22/2011 FINAL   Final    Organism ID, Bacteria STAPHYLOCOCCUS AUREUS   Final   ANAEROBIC CULTURE     Status: Normal (Preliminary result)   Collection Time   10/19/11 10:14 PM      Component Value Range Status Comment   Specimen Description NECK CERVICAL FOUR FIVE EPIDURAL DISC SPACE   Final    Special Requests NONE   Final    Gram Stain PENDING   Incomplete    Culture     Final    Value: NO ANAEROBES ISOLATED; CULTURE IN PROGRESS FOR 5 DAYS   Report Status PENDING    Incomplete   GRAM STAIN     Status: Normal   Collection Time   10/19/11 10:14 PM      Component Value Range Status Comment   Specimen Description NECK CERVICAL FOUR FIVE WPIDURAL DISC SPACE   Final    Special Requests NONE   Final    Gram Stain     Final    Value: FEW WBC PRESENT,BOTH PMN AND MONONUCLEAR     RARE GRAM POSITIVE COCCI IN PAIRS   Report Status 10/19/2011 FINAL   Final   AFB CULTURE WITH SMEAR     Status: Normal (Preliminary result)   Collection Time   10/19/11 10:14 PM      Component Value Range Status Comment   Specimen Description NECK CERVICAL FOUR FIVE WPIDURAL DISC SPACE   Final    Special Requests NONE   Final    ACID FAST SMEAR NO ACID FAST BACILLI SEEN   Final    Culture     Final    Value: CULTURE WILL BE EXAMINED FOR 6 WEEKS BEFORE ISSUING A FINAL REPORT   Report Status PENDING   Incomplete   CULTURE, BLOOD (ROUTINE X 2)     Status: Normal (Preliminary result)   Collection Time   10/20/11  4:40 PM      Component Value Range Status Comment   Specimen Description BLOOD LEFT HAND   Final    Special Requests BOTTLES DRAWN AEROBIC AND ANAEROBIC 5CC   Final    Culture  Setup Time [id number]   Final    Culture     Final    Value:        BLOOD CULTURE RECEIVED NO GROWTH TO DATE CULTURE WILL BE HELD FOR 5 DAYS BEFORE ISSUING A FINAL NEGATIVE REPORT   Report Status PENDING   Incomplete   MRSA PCR SCREENING     Status: Normal   Collection Time   10/24/11  2:42 PM      Component Value Range Status Comment   MRSA by PCR NEGATIVE  NEGATIVE  Final     Studies/Results: Dg Chest Port 1 View  10/24/2011  *RADIOLOGY REPORT*  Clinical Data: Chest congestion  PORTABLE CHEST - 1 VIEW  Comparison: 10/23/2011  Findings: The lung volumes are suboptimally inflated.  Persistent opacity within the left base noted which may be due to atelectasis and/or consolidation.  Pulmonary vascular congestion appears increased from previous exam.  IMPRESSION:  1.  Persistent low lung volumes. 2.   Pulmonary vascular congestion. 3.  Persistent diminished aeration to the left base  Original Report Authenticated By: Rosealee Albee, M.D.   Dg Chest Port 1v Same Day  10/23/2011  *RADIOLOGY REPORT*  Clinical Data: Chest congestion and wheezing.  PORTABLE CHEST - 1 VIEW SAME DAY  Comparison: Chest x-ray 10/21/2011.  Findings: Lung volumes are low.  There is a left basilar opacity obscuring the left hemidiaphragm and blunting of the left costophrenic sulcus.  Right lung appears clear.  There is pulmonary venous congestion without frank pulmonary edema.  Heart size is mildly enlarged (unchanged). The patient is rotated to the left on today's exam, resulting in distortion of the mediastinal contours and reduced diagnostic sensitivity and specificity for mediastinal pathology.  Atherosclerotic calcifications within the thoracic aorta.  Orthopedic fixation hardware incompletely visualized in the lower cervical spine.  IMPRESSION: 1.  Low lung volumes with atelectasis and/or consolidation in the left lower lobe, with superimposed small left-sided pleural effusion. 2.  Mild cardiomegaly with pulmonary venous congestion. 3.  Atherosclerosis  Original Report Authenticated By: Florencia Reasons, M.D.     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 To have uro eval. Renal u/s Will ask pharm to dose ancef TEE difficulties noted. To have repeat attempt.  Repeat BCx (10-20-11) pending.   Bowel hygiene Dr Luciana Axe available if questions over weekend   Johny Sax Infectious Diseases [phone] 10/24/2011, 5:06 PM   LOS: 6 days

## 2011-10-24 NOTE — Progress Notes (Signed)
Occupational Therapy Evaluation *Late entry*  Past Medical History  Diagnosis Date  . Hypertension   . Insomnia secondary to chronic pain   . Generalized headaches   . 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 Anxiety disorder    Past Surgical History  Procedure Date  . Right rotator cuff repair   . Left calcaneal surgery   . Anterior cervical decomp/discectomy fusion 10/19/2011    Procedure: ANTERIOR CERVICAL DECOMPRESSION/DISCECTOMY FUSION 2 LEVELS;  Surgeon: Reinaldo Meeker, MD;  Location: [facility];  Service: Neurosurgery;  Laterality: N/A;  Cervical four-five, five-six Anterior Cervical Decompression Fusion  . Tee without cardioversion 10/23/2011    Procedure: TRANSESOPHAGEAL ECHOCARDIOGRAM (TEE);  Surgeon: Peter M [place], MD;  Location: [facility] ENDOSCOPY;  Service: Cardiovascular;  Laterality: N/A;     10/23/11 1300  OT Visit Information  Last OT Received On 10/23/11  Assistance Needed +2  OT Time Calculation  OT Start Time 1056  OT Stop Time 1145  OT Time Calculation (min) 49 min  Subjective Data  Subjective "I hurt all over"  Patient Stated Goal "Want to get up, I know I need to"  Precautions  Precautions Fall  Required Braces or Orthoses Cervical Brace  Cervical Brace Soft collar  Restrictions  Weight Bearing Restrictions No  Home Living  Lives With Significant other  Available Help at Discharge Family  Type of Home Mobile home  Home Access Ramped entrance  Home Layout One level  Bathroom Shower/Tub Tub/shower unit;Door  Horticulturist, commercial Yes  How Accessible Accessible via wheelchair  Home Adaptive Equipment None  Prior Function  Level of Independence Independent  Driving Yes  Vocation Full time employment  Communication  Communication No difficulties  ADL  Eating/Feeding NPO  Where Assessed - Eating/Feeding Bed level  Grooming Performed;Wash/dry face;Maximal assistance (With Rt. UE)  Where Assessed -  Grooming Supine, head of bed up  Upper Body Bathing Simulated;+1 Total assistance  Where Assessed - Upper Body Bathing Supine, head of bed up  Lower Body Bathing Simulated;+1 Total assistance  Where Assessed - Lower Body Bathing Supine, head of bed up;Lean right and/or left  Upper Body Dressing Simulated;+1 Total assistance  Where Assessed - Upper Body Dressing Supine, head of bed up  Lower Body Dressing Simulated;+1 Total assistance  Where Assessed - Lower Body Dressing Supine, head of bed up;Lean right and/or left  Toilet Transfer (not assessed)  Toileting - Clothing Manipulation and Hygiene Simulated;+1 Total assistance  Where Assessed - Toileting Clothing Manipulation and Hygiene Rolling right and/or left  Tub/Shower Transfer (not assessed)  Tub/Shower Transfer Method Not assessed  Equipment Used Other (comment) (soft call bell)  Transfers/Ambulation Related to ADLs NA  ADL Comments Pt. provided with hand over hand facilitation for rt. UE grasp of washcloth and elbow support to bring to face. Pt. very lethargic and with weakness of all 4 extremities and pain affecting functional status. Pt. completed ADL tasks sitting EOB with total assist truncal support with bil UE support on bed. Pt. able to maintain supported sitting EOB ~65mins. pt. completed Rt. UE reaching and AAROM exercises EOB and LE exercises. Due to pain and fatigue pt. provided assist to return to supine position.  Cognition  Overall Cognitive Status Appears within functional limits for tasks assessed/performed  Area of Impairment (slow to process and respond)  Arousal/Alertness Lethargic  Orientation Level Appears intact for tasks assessed  Behavior During Session Lethargic  Right Upper Extremity  Assessment  RUE ROM/Strength/Tone Deficits  RUE ROM/Strength/Tone Deficits 3+/5 bicep, 3+/5 tricep  RUE Sensation WFL - Light Touch  RUE Coordination Deficits  RUE Coordination Deficits Rt. grasp 3/5  Left Upper Extremity  Assessment  LUE ROM/Strength/Tone Deficits  LUE ROM/Strength/Tone Deficits trace activation except 2/5 triceps   LUE Sensation Deficits  LUE Sensation Deficits impaired light touch  LUE Coordination Deficits  LUE Coordination Deficits No activation of digits.  Bed Mobility  Bed Mobility Rolling Right;Right Sidelying to Sit;Sit to Sidelying Right  Rolling Right 1: +2 Total assist  Rolling Right: Patient Percentage 10%  Rolling Left 1: +2 Total assist  Rolling Left: Patient Percentage 0%  Right Sidelying to Sit 1: +2 Total assist;HOB flat  Right Sidelying to Sit: Patient Percentage 10%  Sit to Sidelying Right 1: +2 Total assist;HOB flat  Sit to Sidelying Right: Patient Percentage 0%  Details for Bed Mobility Assistance Mod verbal cues to facilitate normalized movement patterns with support at trunk and bil LE scooting EOB  Transfers  Transfers Not assessed  Static Sitting Balance  Static Sitting - Balance Support Feet supported  Static Sitting - Level of Assistance 2: Max assist  Exercises  Exercises General Upper Extremity  General Exercises - Upper Extremity  Shoulder Flexion AAROM;Both;10 reps;Seated  Shoulder Extension AAROM;PROM;Both;5 reps;Seated  Elbow Flexion AAROM;Both;5 reps;Supine  Elbow Extension AAROM;Both;5 reps  Shoulder ABduction PROM;Left;10 reps;Supine  Wrist Flexion PROM;Both;AAROM;10 reps;Supine  Wrist Extension AAROM;PROM;10 reps;Supine;Both  Digit Composite Flexion PROM;AAROM;Both;10 reps;Supine  OT - End of Session  Activity Tolerance Patient limited by fatigue;Patient limited by pain  Patient left in bed;with call bell/phone within reach;with bed alarm set;Other (comment) (Soft call bell)  Nurse Communication Mobility status;Patient requests pain meds  OT Assessment  Clinical Impression Statement Pt. presents s/p  admitted for fall at home 6/1.  Pt with acute respiratory and mechancial ventialation needed.  Pt found to have C4-C6 epidural abcess with  spinal cord compression.  Therefore pt received anterior decompression surgery however continues to have left hemiparesis.  Pt. will benefit from skilled OT to increase functional independence to mod-min assist level at D/C to next venue of care to decrease burden of care.  OT Recommendation/Assessment Patient needs continued OT Services  OT Problem List Decreased strength;Decreased range of motion;Decreased activity tolerance;Impaired balance (sitting and/or standing);Decreased cognition;Decreased safety awareness;Decreased knowledge of use of DME or AE;Decreased knowledge of precautions;Impaired UE functional use;Pain;Increased edema  Barriers to Discharge Decreased caregiver support  OT Therapy Diagnosis  Generalized weakness;Acute pain  OT Plan  OT Frequency Min 2X/week  OT Treatment/Interventions Self-care/ADL training;Therapeutic exercise;Neuromuscular education;Energy conservation;DME and/or AE instruction;Therapeutic activities;Patient/family education;Balance training  OT Recommendation  Follow Up Recommendations Skilled nursing facility  Equipment Recommended Defer to next venue  Individuals Consulted  Consulted and Agree with Results and Recommendations Patient  Acute Rehab OT Goals  OT Goal Formulation With patient  Time For Goal Achievement 11/06/11  Potential to Achieve Goals Fair  ADL Goals  Pt Will Perform Eating with adaptive utensils;Supine, head of bed up;with min assist  Pt Will Perform Grooming with min assist;Sitting, edge of bed;Supported;Other (comment) (3 tasks or ~20 mins EOB)  Pt Will Perform Upper Body Bathing with mod assist;Sitting, edge of bed;Supported  Pt Will Transfer to Toilet with max assist;Stand pivot transfer;3-in-1  Additional ADL Goal #1 Pt. will complete bed mobility with mod assist in prep for BADLs  ADL Goal: Additional Goal #1 - Progress Goal set today  Additional ADL Goal #2 Pt. will independently initiate use  of soft call bell and direct proper  placement of call bell.  ADL Goal: Additional Goal #2 - Progress Goal set today  ADL Goal: Eating - Progress Goal set today  ADL Goal: Grooming - Progress Goal set today  ADL Goal: Upper Body Bathing - Progress Goal set today  ADL Goal: Toilet Transfer - Progress Goal set today  Arm Goals  Pt Will Perform AROM with minimal assist;to maintain range of motion;to decrease contractures;Bilateral upper extremities;2 sets;10 reps  Arm Goal: AROM - Progress Goal set today  OT General Charges  $OT Visit 1 Procedure  OT Evaluation  $Initial OT Evaluation Tier II 1 Procedure  OT Treatments  $Self Care/Home Management  8-22 mins  Written Expression  Dominant Hand  Right    Cassandria Anger, OTR/L Pager: [phone] 10/24/2011 .

## 2011-10-24 NOTE — Progress Notes (Signed)
Patient ID: Charles Ball, male   DOB: 1951/03/10, 61 y.o.   MRN: [id number] Subjective: Patient reports Left sided weakness  Objective: Vital signs in last 24 hours: Temp:  [97.9 F (36.6 C)-98.8 F (37.1 C)] 98.1 F (36.7 C) (06/07 0500) Pulse Rate:  [76-86] 76  (06/07 0500) Resp:  [15-33] 18  (06/07 0500) BP: (104-175)/(57-123) 104/57 mmHg (06/07 0500) SpO2:  [90 %-99 %] 97 % (06/07 0500) Weight:  [116.4 kg (256 lb 9.9 oz)] 116.4 kg (256 lb 9.9 oz) (06/06 2014)  Intake/Output from previous day: 06/06 0701 - 06/07 0700 In: 1573.3 [I.V.:1023.3; IV Piggyback:550] Out: -  Intake/Output this shift:    left hemiparesis persists. part of it does seem to be a bit effort related.  Lab Results:  [facility] 10/23/11 0635  WBC 20.9*  HGB 13.7  HCT 40.0  PLT 168   BMET  Basename 10/23/11 0635  NA 133*  K 4.0  CL 97  CO2 21  GLUCOSE 159*  BUN 57*  CREATININE 3.39*  CALCIUM 7.7*    Studies/Results: Dg Chest Port 1v Same Day  10/23/2011  *RADIOLOGY REPORT*  Clinical Data: Chest congestion and wheezing.  PORTABLE CHEST - 1 VIEW SAME DAY  Comparison: Chest x-ray 10/21/2011.  Findings: Lung volumes are low.  There is a left basilar opacity obscuring the left hemidiaphragm and blunting of the left costophrenic sulcus.  Right lung appears clear.  There is pulmonary venous congestion without frank pulmonary edema.  Heart size is mildly enlarged (unchanged). The patient is rotated to the left on today's exam, resulting in distortion of the mediastinal contours and reduced diagnostic sensitivity and specificity for mediastinal pathology.  Atherosclerotic calcifications within the thoracic aorta.  Orthopedic fixation hardware incompletely visualized in the lower cervical spine.  IMPRESSION: 1.  Low lung volumes with atelectasis and/or consolidation in the left lower lobe, with superimposed small left-sided pleural effusion. 2.  Mild cardiomegaly with pulmonary venous congestion. 3.   Atherosclerosis  Original Report Authenticated By: Florencia Reasons, M.D.    Assessment/Plan: He ahs had a marked jump in his BUN/ CR....work up per medical service. Is stable from our stand point. Once he feels better, he will need aggressive rehab.  LOS: 6 days  Per infectious disease.   Reinaldo Meeker, MD 10/24/2011, 7:49 AM

## 2011-10-24 NOTE — Progress Notes (Signed)
After foley cath was placed, more than 1300 cc urine drained.  It is looking like BOO.  Will leave foley in place.  Pt to ICU.   Maryln Manuel, MD

## 2011-10-24 NOTE — Progress Notes (Signed)
ANTIBIOTIC CONSULT NOTE - INITIAL  Pharmacy Consult for Ancef Indication: MSSA bacteremia  No Known Allergies  Patient Measurements: Height: 5\' 10"  (177.8 cm) Weight: 258 lb 13.1 oz (117.4 kg) IBW/kg (Calculated) : 73   Vital Signs: Temp: 97.4 F (36.3 C) (06/07 1600) Temp src: Oral (06/07 1600) BP: 86/47 mmHg (06/07 1730) Pulse Rate: 75  (06/07 1730) Intake/Output from previous day: 06/06 0701 - 06/07 0700 In: 1573.3 [I.V.:1023.3; IV Piggyback:550] Out: -  Intake/Output from this shift: Total I/O In: 0  Out: 4020 [Urine:4020]  Labs:  [facility] 10/24/11 1100 10/24/11 0810 10/23/11 0635  WBC -- 15.1* 20.9*  HGB -- 12.5* 13.7  PLT -- 153 168  LABCREA -- -- --  CREATININE 5.96* -- 3.39*   Estimated Creatinine Clearance: 17.4 ml/min (by C-G formula based on Cr of 5.96).    Microbiology: Recent Results (from the past 720 hour(s))  CULTURE, BLOOD (ROUTINE X 2)     Status: Normal   Collection Time   10/18/11  6:55 PM      Component Value Range Status Comment   Specimen Description BLOOD RIGHT ARM  5 ML IN [facility] BOTTLE   Final    Special Requests NONE   Final    Culture  Setup Time [id number]   Final    Culture     Final    Value: STAPHYLOCOCCUS AUREUS     Note: RIFAMPIN AND GENTAMICIN SHOULD NOT BE USED AS SINGLE DRUGS FOR TREATMENT OF STAPH INFECTIONS.     Note: Gram Stain Report Called to,Read Back By and Verified With: RN S. DILLON ON 10/19/11 AT 1500 BY DTERRY   Report Status 10/21/2011 FINAL   Final    Organism ID, Bacteria STAPHYLOCOCCUS AUREUS   Final   URINE CULTURE     Status: Normal   Collection Time   10/18/11  8:14 PM      Component Value Range Status Comment   Specimen Description URINE, CATHETERIZED   Final    Special Requests NONE   Final    Culture  Setup Time [id number]   Final    Colony Count NO GROWTH   Final    Culture NO GROWTH   Final    Report Status 10/20/2011 FINAL   Final   CULTURE, BLOOD (ROUTINE X 2)     Status: Normal   Collection  Time   10/18/11  9:15 PM      Component Value Range Status Comment   Specimen Description BLOOD LEFT HAND   Final    Special Requests BOTTLES DRAWN AEROBIC AND ANAEROBIC 4CC   Final    Culture  Setup Time [id number]   Final    Culture     Final    Value: STAPHYLOCOCCUS AUREUS     Note: SUSCEPTIBILITIES PERFORMED ON PREVIOUS CULTURE WITHIN THE LAST 5 DAYS.     Note: Gram Stain Report Called to,Read Back By and Verified With: RN S. DILLON ON 10/19/11 AT 1840 BY DTERRY   Report Status 10/21/2011 FINAL   Final   CSF CULTURE     Status: Normal   Collection Time   10/18/11 11:45 PM      Component Value Range Status Comment   Specimen Description CSF   Final    Special Requests Normal   Final    Gram Stain     Final    Value: CYTOSPIN SLIDE WBC PRESENT,BOTH PMN AND MONONUCLEAR     NO ORGANISMS SEEN     Gram Stain  Report Called to,Read Back By and Verified With: Gram Stain Report Called to,Read Back By and Verified With: TWOODS RN AT 0141 ON [id number] BY DLONG Performed by [facility]   Culture NO GROWTH 3 DAYS   Final    Report Status 10/22/2011 FINAL   Final   GRAM STAIN     Status: Normal   Collection Time   10/18/11 11:45 PM      Component Value Range Status Comment   Specimen Description CSF   Final    Special Requests Normal   Final    Gram Stain     Final    Value: WBC PRESENT,BOTH PMN AND MONONUCLEAR     NO ORGANISMS SEEN     CYTO SPUN SLIDE     Gram Stain Report Called to,Read Back By and Verified With: TWOODS RN AT 0141 ON [id number] BY DLONG   Report Status 10/19/2011 FINAL   Final   MRSA PCR SCREENING     Status: Normal   Collection Time   10/19/11 12:23 AM      Component Value Range Status Comment   MRSA by PCR NEGATIVE  NEGATIVE  Final   WOUND CULTURE     Status: Normal   Collection Time   10/19/11  8:29 PM      Component Value Range Status Comment   Specimen Description WOUND NECK CERVICAL FOUR FIVE DISC SPACE   Final    Special Requests NONE   Final    Gram Stain      Final    Value: RARE WBC PRESENT, PREDOMINANTLY MONONUCLEAR     NO ORGANISMS SEEN     Performed at [facility]   Culture     Final    Value: MODERATE STAPHYLOCOCCUS AUREUS     Note: SUSCEPTIBILITIES PERFORMED ON PREVIOUS CULTURE WITHIN THE LAST 5 DAYS.   Report Status 10/22/2011 FINAL   Final   AFB CULTURE WITH SMEAR     Status: Normal (Preliminary result)   Collection Time   10/19/11  8:29 PM      Component Value Range Status Comment   Specimen Description WOUND NECK CERVICAL FOUR FIVE DISC SPACE   Final    Special Requests NONE   Final    ACID FAST SMEAR NO ACID FAST BACILLI SEEN   Final    Culture     Final    Value: CULTURE WILL BE EXAMINED FOR 6 WEEKS BEFORE ISSUING A FINAL REPORT   Report Status PENDING   Incomplete   GRAM STAIN     Status: Normal   Collection Time   10/19/11  8:29 PM      Component Value Range Status Comment   Specimen Description WOUND NECK CERVICAL FOUR FIVE DISC SPACE   Final    Special Requests NONE   Final    Gram Stain     Final    Value: RARE WBC PRESENT, PREDOMINANTLY MONONUCLEAR     NO ORGANISMS SEEN   Report Status 10/19/2011 FINAL   Final   ANAEROBIC CULTURE     Status: Normal (Preliminary result)   Collection Time   10/19/11  8:33 PM      Component Value Range Status Comment   Specimen Description NECK CERVICAL FOUR FIVE DISC SPACE   Final    Special Requests NONE   Final    Gram Stain PENDING   Incomplete    Culture     Final    Value: NO ANAEROBES ISOLATED;  CULTURE IN PROGRESS FOR 5 DAYS   Report Status PENDING   Incomplete   TISSUE CULTURE     Status: Normal   Collection Time   10/19/11  8:34 PM      Component Value Range Status Comment   Specimen Description TISSUE NECK CERVICAL FOUR FIVE DISC   Final    Special Requests NONE   Final    Gram Stain     Final    Value: NO WBC SEEN     NO ORGANISMS SEEN     Performed at [facility]   Culture     Final    Value: MODERATE STAPHYLOCOCCUS AUREUS     Note: RIFAMPIN AND  GENTAMICIN SHOULD NOT BE USED AS SINGLE DRUGS FOR TREATMENT OF STAPH INFECTIONS.   Report Status 10/22/2011 FINAL   Final    Organism ID, Bacteria STAPHYLOCOCCUS AUREUS   Final   AFB CULTURE WITH SMEAR     Status: Normal (Preliminary result)   Collection Time   10/19/11  8:34 PM      Component Value Range Status Comment   Specimen Description TISSUE NECK CERVICAL FOUR FIVE DISC   Final    Special Requests NONE   Final    ACID FAST SMEAR NO ACID FAST BACILLI SEEN   Final    Culture     Final    Value: CULTURE WILL BE EXAMINED FOR 6 WEEKS BEFORE ISSUING A FINAL REPORT   Report Status PENDING   Incomplete   GRAM STAIN     Status: Normal   Collection Time   10/19/11  8:34 PM      Component Value Range Status Comment   Specimen Description TISSUE NECK CERVICAL FOUR FIVE DISC   Final    Special Requests NONE   Final    Gram Stain     Final    Value: NO WBC SEEN     NO ORGANISMS SEEN   Report Status 10/19/2011 FINAL   Final   ANAEROBIC CULTURE     Status: Normal (Preliminary result)   Collection Time   10/19/11  8:37 PM      Component Value Range Status Comment   Specimen Description TISSUE CERIVAL FOUR FIVE DISC   Final    Special Requests NONE   Final    Gram Stain PENDING   Incomplete    Culture     Final    Value: NO ANAEROBES ISOLATED; CULTURE IN PROGRESS FOR 5 DAYS   Report Status PENDING   Incomplete   WOUND CULTURE     Status: Normal   Collection Time   10/19/11 10:14 PM      Component Value Range Status Comment   Specimen Description WOUND NECK CERVICAL FOUR FIVE EPIDURAL DISC SPACE   Final    Special Requests NONE   Final    Gram Stain     Final    Value: FEW WBC PRESENT,BOTH PMN AND MONONUCLEAR     RARE GRAM POSITIVE COCCI IN PAIRS     Performed at [facility]   Culture     Final    Value: MODERATE STAPHYLOCOCCUS AUREUS     Note: RIFAMPIN AND GENTAMICIN SHOULD NOT BE USED AS SINGLE DRUGS FOR TREATMENT OF STAPH INFECTIONS.   Report Status 10/22/2011 FINAL   Final     Organism ID, Bacteria STAPHYLOCOCCUS AUREUS   Final   ANAEROBIC CULTURE     Status: Normal (Preliminary result)   Collection Time  10/19/11 10:14 PM      Component Value Range Status Comment   Specimen Description NECK CERVICAL FOUR FIVE EPIDURAL DISC SPACE   Final    Special Requests NONE   Final    Gram Stain PENDING   Incomplete    Culture     Final    Value: NO ANAEROBES ISOLATED; CULTURE IN PROGRESS FOR 5 DAYS   Report Status PENDING   Incomplete   GRAM STAIN     Status: Normal   Collection Time   10/19/11 10:14 PM      Component Value Range Status Comment   Specimen Description NECK CERVICAL FOUR FIVE WPIDURAL DISC SPACE   Final    Special Requests NONE   Final    Gram Stain     Final    Value: FEW WBC PRESENT,BOTH PMN AND MONONUCLEAR     RARE GRAM POSITIVE COCCI IN PAIRS   Report Status 10/19/2011 FINAL   Final   AFB CULTURE WITH SMEAR     Status: Normal (Preliminary result)   Collection Time   10/19/11 10:14 PM      Component Value Range Status Comment   Specimen Description NECK CERVICAL FOUR FIVE WPIDURAL DISC SPACE   Final    Special Requests NONE   Final    ACID FAST SMEAR NO ACID FAST BACILLI SEEN   Final    Culture     Final    Value: CULTURE WILL BE EXAMINED FOR 6 WEEKS BEFORE ISSUING A FINAL REPORT   Report Status PENDING   Incomplete   CULTURE, BLOOD (ROUTINE X 2)     Status: Normal (Preliminary result)   Collection Time   10/20/11  4:40 PM      Component Value Range Status Comment   Specimen Description BLOOD LEFT HAND   Final    Special Requests BOTTLES DRAWN AEROBIC AND ANAEROBIC 5CC   Final    Culture  Setup Time [id number]   Final    Culture     Final    Value:        BLOOD CULTURE RECEIVED NO GROWTH TO DATE CULTURE WILL BE HELD FOR 5 DAYS BEFORE ISSUING A FINAL NEGATIVE REPORT   Report Status PENDING   Incomplete   MRSA PCR SCREENING     Status: Normal   Collection Time   10/24/11  2:42 PM      Component Value Range Status Comment   MRSA by PCR NEGATIVE   NEGATIVE  Final     Medical History: Past Medical History  Diagnosis Date  . Hypertension   . Insomnia secondary to chronic pain   . Generalized headaches   . 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 Anxiety disorder     Assessment: 61 yo M known to Pharmacy since ICU admission.  Pt has MSSA bacteremia and is currently receiving Ancef 1gm IV Q12h.  Noted worsening renal function/ARF.  SCr 5.96 with CrCl ~ 15-20 ml/min.  Goal of Therapy:  Renal dose adjustment  Plan:  Continue Ancef 1gm IV Q12h.  Dose is appropriate for patient's current renal function.  Will continue to follow and adjust dose as needed based on changing renal function and/or further culture data.  [employer], Pharm.D., BCPS Clinical Pharmacist Pager [phone] 10/24/2011 5:46 PM

## 2011-10-24 NOTE — Progress Notes (Addendum)
Report called to [place], RN on 2100. Pt to transfer to 2105. [place], Rosanna Randy

## 2011-10-24 NOTE — Progress Notes (Signed)
SLP Cancellation Note  Evaluation cancelled today due to medical issues with patient which prohibited therapy. New orders received however at this time rapid response in room due to shallow breathing, decreased secretion management. Will f/u for improvements and re-attempt as appropriate.   Ferdinand Lango MA, CCC-SLP [phone]   Ferdinand Lango Meryl 10/24/2011, 12:03 PM

## 2011-10-24 NOTE — Progress Notes (Signed)
Pt stated "Im dying." Pt in pain; Pain Meds given. Lungs diminished with rhonchi. Respiration shallow, but even and unlabored. VS stable. Cough/deep breath and oral suction done. Discussed pt status with rapid response nurse. RR nurse to evaluate pt. Awaiting available bed on 3000. [place], Rosanna Randy

## 2011-10-24 NOTE — Procedures (Deleted)
Extubation Procedure Note  Patient Details:   Name: Charles Ball DOB: 08/30/1950 MRN: [id number]   Airway Documentation:     Evaluation  O2 sats: stable throughout Complications: No apparent complications Patient did tolerate procedure well. Bilateral Breath Sounds: Rhonchi Suctioning: Airway Yes pt able to vocalize after extubation.   Pt extubated at this time per MD order and placed on 3L [place]. No complications noted. No stridor noted. VC of 1.2L/min. Pt able to breathe around deflated cuff. VS stable: HR 78, RR 21, BP 127/91, SPo2 94 on 3L [place]. Pt has strong adequate cough and able to vocalize. RT will continue to monitor.    Charles Ball [facility] 10/24/2011, 3:12 PM

## 2011-10-24 NOTE — Progress Notes (Signed)
PT Cancellation Note  Treatment cancelled today due to medical issues with patient which prohibited therapy  10/24/2011  Hayes Bing, PT [phone] (pager)

## 2011-10-24 NOTE — Progress Notes (Signed)
While rounding on unit asked to assist with assessment of patient - RN concerned with patients lung sounds and weak cough.  Patient lethargic - arouses and is oriented = but very weak = weak cough - wet respirations - bil BS present with rales and rhonchi in all lobes - concerned with secretion managment - RR 16 - O2 sats 94-92% on 3 liter nasal cannula.  Abd large distended - had CREAT bump yesterday - no urine output recorded during night bladder scan revealed 93 cc urine - hard to assess secondary to large firm abd.  Skin warm and moist.  Call to MD Dr. Laural Benes per Toma Copier RN.  Stat PCXR done.  Stat labs drawn.  Patient repositioned to upright for max airwar protection.  Repage to Dr. Laural Benes with increasing concerns - asked to come see patient.  1210 Dr. Laural Benes here.  O2 sats 92 % on 4 liter nasal cannula - desats to 80's when sleeping. Creat results back - increased again today.   Able to cough up VERY minimal secretions - frothy white.  Able to speak full sentences.  Generalized edema.  PCXR and Labs reviewed with Dr. Laural Benes.  Suggested transfer to ICU - concerned for high risk continued deteroriation and with hx ACSD airway concerns  Dr. Laural Benes writing orders. BP 122/66  HR 79 RR 16.  NAD.  Dr. Tyson Alias and Canary Brim, NP here - agree with transfer.  Placed foley cath without resistance - large amount dark amber urine return - Dr. Laural Benes present - clamped at 1000 cc out. Abd softer.  ABG drawn.  ABG results noted - reviewed with Dr. Laural Benes.  Handoff to [facility] RN - staff to transport patient to 2105.

## 2011-10-24 NOTE — Progress Notes (Addendum)
[facility] Progress Note  10/24/2011  Subjective: Pt having more difficulty managing his secretions, having significant anterior chest congestion, cough, renal function not improving.  RR was called to see pt this morning and concerned about pt's resp status.  I ordered a stat CXR and suctioning.    Objective:  Vital signs in last 24 hours: Filed Vitals:   10/23/11 1651 10/23/11 2014 10/24/11 0500 10/24/11 1000  BP: 117/70 130/74 104/57 106/62  Pulse: 80 86 76 78  Temp: 98.8 F (37.1 C) 98.2 F (36.8 C) 98.1 F (36.7 C) 98.4 F (36.9 C)  TempSrc: Oral Oral Oral Oral  Resp: 18 18 18 20   Height:      Weight:  116.4 kg (256 lb 9.9 oz)    SpO2: 95% 99% 97% 96%   Weight change: 1.8 kg (3 lb 15.5 oz)  Intake/Output Summary (Last 24 hours) at 10/24/11 1224 Last data filed at 10/24/11 0853  Gross per 24 hour  Intake 1573.33 ml  Output      0 ml  Net 1573.33 ml   No results found for this basename: HGBA1C   Lab Results  Component Value Date   CREATININE 5.96* 10/24/2011    Review of Systems As above, otherwise all reviewed and reported negative  Physical Exam General - awake, alert, mild distress, cooperative HEENT - NCAT, MM dry Lungs - BBS, shallow with coarse ant upper airway congestion noises CV - normal s1, s2 sounds Abd - soft, distended, no masses palpated, nontender Ext - no cyanosis, 1+ edema bilateral LEs -pitting  Lab Results: Results for orders placed during the hospital encounter of 10/18/11 (from the past 24 hour(s))  GLUCOSE, CAPILLARY     Status: Normal   Collection Time   10/23/11  4:49 PM      Component Value Range   Glucose-Capillary 96  70 - 99 (mg/dL)  GLUCOSE, CAPILLARY     Status: Abnormal   Collection Time   10/23/11  8:13 PM      Component Value Range   Glucose-Capillary 105 (*) 70 - 99 (mg/dL)  PRO B NATRIURETIC PEPTIDE     Status: Abnormal   Collection Time   10/24/11  6:40 AM      Component Value Range   Pro B Natriuretic peptide  (BNP) 226.7 (*) 0 - 125 (pg/mL)  GLUCOSE, CAPILLARY     Status: Abnormal   Collection Time   10/24/11  7:35 AM      Component Value Range   Glucose-Capillary 120 (*) 70 - 99 (mg/dL)  CBC     Status: Abnormal   Collection Time   10/24/11  8:10 AM      Component Value Range   WBC 15.1 (*) 4.0 - 10.5 (K/uL)   RBC 4.14 (*) 4.22 - 5.81 (MIL/uL)   Hemoglobin 12.5 (*) 13.0 - 17.0 (g/dL)   HCT 16.1 (*) 09.6 - 52.0 (%)   MCV 86.0  78.0 - 100.0 (fL)   MCH 30.2  26.0 - 34.0 (pg)   MCHC 35.1  30.0 - 36.0 (g/dL)   RDW 04.5  40.9 - 81.1 (%)   Platelets 153  150 - 400 (K/uL)  COMPREHENSIVE METABOLIC PANEL     Status: Abnormal (Preliminary result)   Collection Time   10/24/11 11:00 AM      Component Value Range   Sodium 134 (*) 135 - 145 (mEq/L)   Potassium 4.0  3.5 - 5.1 (mEq/L)   Chloride 99  96 - 112 (mEq/L)  CO2 19  19 - 32 (mEq/L)   Glucose, Bld 116 (*) 70 - 99 (mg/dL)   BUN 89 (*) 6 - 23 (mg/dL)   Creatinine, Ser 8.11 (*) 0.50 - 1.35 (mg/dL)   Calcium 7.6 (*) 8.4 - 10.5 (mg/dL)   Total Protein 5.6 (*) 6.0 - 8.3 (g/dL)   Albumin 1.9 (*) 3.5 - 5.2 (g/dL)   AST 18  0 - 37 (U/L)   ALT PENDING  0 - 53 (U/L)   Alkaline Phosphatase 50  39 - 117 (U/L)   Total Bilirubin 0.3  0.3 - 1.2 (mg/dL)   GFR calc non Af Amer 9 (*) >90 (mL/min)   GFR calc Af Amer 11 (*) >90 (mL/min)  GLUCOSE, CAPILLARY     Status: Abnormal   Collection Time   10/24/11 11:30 AM      Component Value Range   Glucose-Capillary 110 (*) 70 - 99 (mg/dL)    Micro Results: Recent Results (from the past 240 hour(s))  CULTURE, BLOOD (ROUTINE X 2)     Status: Normal   Collection Time   10/18/11  6:55 PM      Component Value Range Status Comment   Specimen Description BLOOD RIGHT ARM  5 ML IN [facility] BOTTLE   Final    Special Requests NONE   Final    Culture  Setup Time [id number]   Final    Culture     Final    Value: STAPHYLOCOCCUS AUREUS     Note: RIFAMPIN AND GENTAMICIN SHOULD NOT BE USED AS SINGLE DRUGS FOR TREATMENT OF  STAPH INFECTIONS.     Note: Gram Stain Report Called to,Read Back By and Verified With: RN S. DILLON ON 10/19/11 AT 1500 BY DTERRY   Report Status 10/21/2011 FINAL   Final    Organism ID, Bacteria STAPHYLOCOCCUS AUREUS   Final   URINE CULTURE     Status: Normal   Collection Time   10/18/11  8:14 PM      Component Value Range Status Comment   Specimen Description URINE, CATHETERIZED   Final    Special Requests NONE   Final    Culture  Setup Time [id number]   Final    Colony Count NO GROWTH   Final    Culture NO GROWTH   Final    Report Status 10/20/2011 FINAL   Final   CULTURE, BLOOD (ROUTINE X 2)     Status: Normal   Collection Time   10/18/11  9:15 PM      Component Value Range Status Comment   Specimen Description BLOOD LEFT HAND   Final    Special Requests BOTTLES DRAWN AEROBIC AND ANAEROBIC 4CC   Final    Culture  Setup Time [id number]   Final    Culture     Final    Value: STAPHYLOCOCCUS AUREUS     Note: SUSCEPTIBILITIES PERFORMED ON PREVIOUS CULTURE WITHIN THE LAST 5 DAYS.     Note: Gram Stain Report Called to,Read Back By and Verified With: RN S. DILLON ON 10/19/11 AT 1840 BY DTERRY   Report Status 10/21/2011 FINAL   Final   CSF CULTURE     Status: Normal   Collection Time   10/18/11 11:45 PM      Component Value Range Status Comment   Specimen Description CSF   Final    Special Requests Normal   Final    Gram Stain     Final    Value: CYTOSPIN SLIDE WBC  PRESENT,BOTH PMN AND MONONUCLEAR     NO ORGANISMS SEEN     Gram Stain Report Called to,Read Back By and Verified With: Gram Stain Report Called to,Read Back By and Verified With: TWOODS RN AT 0141 ON [id number] BY DLONG Performed by [facility]   Culture NO GROWTH 3 DAYS   Final    Report Status 10/22/2011 FINAL   Final   GRAM STAIN     Status: Normal   Collection Time   10/18/11 11:45 PM      Component Value Range Status Comment   Specimen Description CSF   Final    Special Requests Normal   Final    Gram Stain      Final    Value: WBC PRESENT,BOTH PMN AND MONONUCLEAR     NO ORGANISMS SEEN     CYTO SPUN SLIDE     Gram Stain Report Called to,Read Back By and Verified With: TWOODS RN AT 0141 ON [id number] BY DLONG   Report Status 10/19/2011 FINAL   Final   MRSA PCR SCREENING     Status: Normal   Collection Time   10/19/11 12:23 AM      Component Value Range Status Comment   MRSA by PCR NEGATIVE  NEGATIVE  Final   WOUND CULTURE     Status: Normal   Collection Time   10/19/11  8:29 PM      Component Value Range Status Comment   Specimen Description WOUND NECK CERVICAL FOUR FIVE DISC SPACE   Final    Special Requests NONE   Final    Gram Stain     Final    Value: RARE WBC PRESENT, PREDOMINANTLY MONONUCLEAR     NO ORGANISMS SEEN     Performed at [facility]   Culture     Final    Value: MODERATE STAPHYLOCOCCUS AUREUS     Note: SUSCEPTIBILITIES PERFORMED ON PREVIOUS CULTURE WITHIN THE LAST 5 DAYS.   Report Status 10/22/2011 FINAL   Final   AFB CULTURE WITH SMEAR     Status: Normal (Preliminary result)   Collection Time   10/19/11  8:29 PM      Component Value Range Status Comment   Specimen Description WOUND NECK CERVICAL FOUR FIVE DISC SPACE   Final    Special Requests NONE   Final    ACID FAST SMEAR NO ACID FAST BACILLI SEEN   Final    Culture     Final    Value: CULTURE WILL BE EXAMINED FOR 6 WEEKS BEFORE ISSUING A FINAL REPORT   Report Status PENDING   Incomplete   GRAM STAIN     Status: Normal   Collection Time   10/19/11  8:29 PM      Component Value Range Status Comment   Specimen Description WOUND NECK CERVICAL FOUR FIVE DISC SPACE   Final    Special Requests NONE   Final    Gram Stain     Final    Value: RARE WBC PRESENT, PREDOMINANTLY MONONUCLEAR     NO ORGANISMS SEEN   Report Status 10/19/2011 FINAL   Final   ANAEROBIC CULTURE     Status: Normal (Preliminary result)   Collection Time   10/19/11  8:33 PM      Component Value Range Status Comment   Specimen Description NECK CERVICAL  FOUR FIVE DISC SPACE   Final    Special Requests NONE   Final    Gram Stain PENDING  Incomplete    Culture     Final    Value: NO ANAEROBES ISOLATED; CULTURE IN PROGRESS FOR 5 DAYS   Report Status PENDING   Incomplete   TISSUE CULTURE     Status: Normal   Collection Time   10/19/11  8:34 PM      Component Value Range Status Comment   Specimen Description TISSUE NECK CERVICAL FOUR FIVE DISC   Final    Special Requests NONE   Final    Gram Stain     Final    Value: NO WBC SEEN     NO ORGANISMS SEEN     Performed at [facility]   Culture     Final    Value: MODERATE STAPHYLOCOCCUS AUREUS     Note: RIFAMPIN AND GENTAMICIN SHOULD NOT BE USED AS SINGLE DRUGS FOR TREATMENT OF STAPH INFECTIONS.   Report Status 10/22/2011 FINAL   Final    Organism ID, Bacteria STAPHYLOCOCCUS AUREUS   Final   AFB CULTURE WITH SMEAR     Status: Normal (Preliminary result)   Collection Time   10/19/11  8:34 PM      Component Value Range Status Comment   Specimen Description TISSUE NECK CERVICAL FOUR FIVE DISC   Final    Special Requests NONE   Final    ACID FAST SMEAR NO ACID FAST BACILLI SEEN   Final    Culture     Final    Value: CULTURE WILL BE EXAMINED FOR 6 WEEKS BEFORE ISSUING A FINAL REPORT   Report Status PENDING   Incomplete   GRAM STAIN     Status: Normal   Collection Time   10/19/11  8:34 PM      Component Value Range Status Comment   Specimen Description TISSUE NECK CERVICAL FOUR FIVE DISC   Final    Special Requests NONE   Final    Gram Stain     Final    Value: NO WBC SEEN     NO ORGANISMS SEEN   Report Status 10/19/2011 FINAL   Final   ANAEROBIC CULTURE     Status: Normal (Preliminary result)   Collection Time   10/19/11  8:37 PM      Component Value Range Status Comment   Specimen Description TISSUE CERIVAL FOUR FIVE DISC   Final    Special Requests NONE   Final    Gram Stain PENDING   Incomplete    Culture     Final    Value: NO ANAEROBES ISOLATED; CULTURE IN PROGRESS FOR 5 DAYS    Report Status PENDING   Incomplete   WOUND CULTURE     Status: Normal   Collection Time   10/19/11 10:14 PM      Component Value Range Status Comment   Specimen Description WOUND NECK CERVICAL FOUR FIVE EPIDURAL DISC SPACE   Final    Special Requests NONE   Final    Gram Stain     Final    Value: FEW WBC PRESENT,BOTH PMN AND MONONUCLEAR     RARE GRAM POSITIVE COCCI IN PAIRS     Performed at [facility]   Culture     Final    Value: MODERATE STAPHYLOCOCCUS AUREUS     Note: RIFAMPIN AND GENTAMICIN SHOULD NOT BE USED AS SINGLE DRUGS FOR TREATMENT OF STAPH INFECTIONS.   Report Status 10/22/2011 FINAL   Final    Organism ID, Bacteria STAPHYLOCOCCUS AUREUS   Final  ANAEROBIC CULTURE     Status: Normal (Preliminary result)   Collection Time   10/19/11 10:14 PM      Component Value Range Status Comment   Specimen Description NECK CERVICAL FOUR FIVE EPIDURAL DISC SPACE   Final    Special Requests NONE   Final    Gram Stain PENDING   Incomplete    Culture     Final    Value: NO ANAEROBES ISOLATED; CULTURE IN PROGRESS FOR 5 DAYS   Report Status PENDING   Incomplete   GRAM STAIN     Status: Normal   Collection Time   10/19/11 10:14 PM      Component Value Range Status Comment   Specimen Description NECK CERVICAL FOUR FIVE WPIDURAL DISC SPACE   Final    Special Requests NONE   Final    Gram Stain     Final    Value: FEW WBC PRESENT,BOTH PMN AND MONONUCLEAR     RARE GRAM POSITIVE COCCI IN PAIRS   Report Status 10/19/2011 FINAL   Final   AFB CULTURE WITH SMEAR     Status: Normal (Preliminary result)   Collection Time   10/19/11 10:14 PM      Component Value Range Status Comment   Specimen Description NECK CERVICAL FOUR FIVE WPIDURAL DISC SPACE   Final    Special Requests NONE   Final    ACID FAST SMEAR NO ACID FAST BACILLI SEEN   Final    Culture     Final    Value: CULTURE WILL BE EXAMINED FOR 6 WEEKS BEFORE ISSUING A FINAL REPORT   Report Status PENDING   Incomplete   CULTURE,  BLOOD (ROUTINE X 2)     Status: Normal (Preliminary result)   Collection Time   10/20/11  4:40 PM      Component Value Range Status Comment   Specimen Description BLOOD LEFT HAND   Final    Special Requests BOTTLES DRAWN AEROBIC AND ANAEROBIC 5CC   Final    Culture  Setup Time [id number]   Final    Culture     Final    Value:        BLOOD CULTURE RECEIVED NO GROWTH TO DATE CULTURE WILL BE HELD FOR 5 DAYS BEFORE ISSUING A FINAL NEGATIVE REPORT   Report Status PENDING   Incomplete     Medications:  Scheduled Meds:   . antiseptic oral rinse  15 mL Mouth Rinse QID  .  ceFAZolin (ANCEF) IV  1 g Intravenous Q12H  . chlorhexidine  15 mL Mouth Rinse BID  . feeding supplement  237 mL Oral BID BM  . hydrochlorothiazide  12.5 mg Oral Daily  . insulin aspart  0-15 Units Subcutaneous TID WC  . insulin aspart  0-5 Units Subcutaneous QHS  . insulin aspart  4 Units Subcutaneous TID WC  . insulin glargine  5 Units Subcutaneous QHS  . polyethylene glycol  17 g Oral BID  . senna-docusate  2 tablet Oral BID  . sodium chloride  500 mL Intravenous Once  . DISCONTD:  ceFAZolin (ANCEF) IV  1 g Intravenous Q8H  . DISCONTD: fentaNYL  250 mcg Intravenous Once  . DISCONTD: midazolam  10 mg Intravenous Once  . DISCONTD: polyethylene glycol  17 g Oral Daily   Continuous Infusions:   . sodium chloride 50 mL/hr at 10/23/11 2050   PRN Meds:.acetaminophen, acetaminophen, ALPRAZolam, fentaNYL, Glycerin (Adult), menthol-cetylpyridinium, ondansetron (ZOFRAN) IV, oxyCODONE, phenol, DISCONTD: benzocaine, DISCONTD: butamben-tetracaine-benzocaine, DISCONTD:  fentaNYL, DISCONTD: midazolam  Assessment/Plan:  Acute Resp Change / decompensation - Transfer to ICU for further care, I called the critical care team to assess - continue supplemental o2 and resp support as needed  Acute Renal Failure / Dehydration  -bolus IVFs and start NS (see orders)  -place Foley catheter r/o BOO  C4-C5, C5-C6 compression. Lt  hemiparesis  -Post surgical decompression Patent examiner)  - Cont analgesia - see orders  -rehab CIR consult    Anxiety Disorder  - changing alprazolam to 0.5 mg TID prn only, hold for sedation   Staph bacteremia/cervical discitis/epidural abscess  Cultures:  Blood cultures 6/1: Staph aureus  CSF 6/1: ng  Wound cx 6/2 - staph aureus  Continue cefazolin IV  TEE not able to be done because scope could not be advanced afterr 2 attempts  Constipation  -continue laxative therapy   Mild ventricular and atrial ectopy - Cont telemetry   Mild thrombocytopenia - resolved  - SCDs for DVT prophy   Hyperglycemia - controlled  - Continue SSI and lantus   I updated pt's daughter.   Critical care time spent 45 mins    LOS: 6 days   Jentry Warnell 10/24/2011, 12:24 PM  Cleora Fleet, MD, CDE, FAAFP [facility] [place], [place]  [phone]

## 2011-10-24 NOTE — Progress Notes (Addendum)
Name: Charles Ball MRN: [id number] DOB: 1950-06-23    LOS: 6  PULMONARY / CRITICAL CARE MEDICINE FOLLOW UP NOTE  HPI: 61 year old male admitted initially to [facility] 6/1 for presumed meningitis. He was found down with decreased movement of all four extremities. An MRI of the cervical spine showed C4-C5 and C5-C6 herniation and was transferred emergently to [facility] 6/2 for surgical decompression. Of note his blood cultures grew GPC 4/4. At the time of my examination the patient is sedated with propofol and fentanyl and on mechanical ventilation.  61 year old male with history of an epidural abscess post op during this admission.  Was intubated 6/1 for respiratory failure and sepsis that resolved.  Extubated 6/3 and transferred out to the floor on 6/4 to be transferred back to the ICU on 6/7 with new onset renal failure and aneuria.  Foley placed and patient was given lasix with improvement in respiratory "failure" and resolution of aneuria.  Current Status: RASS 0. CAM-ICU negative. No distress. No new complaints   Vital Signs: Temp:  [98.1 F (36.7 C)-98.8 F (37.1 C)] 98.4 F (36.9 C) (06/07 1000) Pulse Rate:  [76-86] 79  (06/07 1130) Resp:  [15-33] 16  (06/07 1130) BP: (104-175)/(57-123) 122/66 mmHg (06/07 1130) SpO2:  [90 %-99 %] 92 % (06/07 1130) Weight:  [116.4 kg (256 lb 9.9 oz)] 116.4 kg (256 lb 9.9 oz) (06/06 2014)  Physical Examination: General:  NAD Neuro:  L hemiparesis HEENT:  PERRL, pink conjunctivae, moist membranes Neck:  Supple, no JVD. ACDF scar noted Cardiovascular:  RRR, no M/R/G Lungs:  Clear Abdomen:  Soft, nontender, nondistended, bowel sounds present Ext: no edema  CXR : 6/4 NAD  BMET    Component Value Date/Time   NA 134* 10/24/2011 1100   K 4.0 10/24/2011 1100   CL 99 10/24/2011 1100   CO2 19 10/24/2011 1100   GLUCOSE 116* 10/24/2011 1100   BUN 89* 10/24/2011 1100   CREATININE 5.96* 10/24/2011 1100   CALCIUM 7.6* 10/24/2011 1100   GFRNONAA 9* 10/24/2011 1100   GFRAA 11* 10/24/2011 1100   CBC    Component Value Date/Time   WBC 15.1* 10/24/2011 0810   RBC 4.14* 10/24/2011 0810   HGB 12.5* 10/24/2011 0810   HCT 35.6* 10/24/2011 0810   PLT 153 10/24/2011 0810   MCV 86.0 10/24/2011 0810   MCH 30.2 10/24/2011 0810   MCHC 35.1 10/24/2011 0810   RDW 13.8 10/24/2011 0810   LYMPHSABS 0.5* 10/19/2011 1006   MONOABS 0.4 10/19/2011 1006   EOSABS 0.0 10/19/2011 1006   BASOSABS 0.0 10/19/2011 1006   ASSESSMENT AND PLAN  61 year old male with history of an epidural abscess post op during this admission.  Was intubated 6/1 for respiratory failure and sepsis that resolved.  Extubated 6/3 and transferred out to the floor on 6/4 to be transferred back to the ICU on 6/7 with new onset renal failure and aneuria.  Foley placed and patient was given lasix with improvement in respiratory "failure" and resolution of aneuria.  Pulmonary: airway protection is a concern but the vast majority of his respiratory failure is due to pain from bladder and fluid overload.  Patient was in respiratory failure prior to treatment.  Still at risk of being intubated. Plan:  KVO IVF  Lasix  F/U BMET.  O2.  Swallow evaluation.  Cardiac: HR and BP ok. Plan: Continue diureses.  Monitor BP.  Renal: acute renal failure, likely related to post renal azotemia.  Plan: Keep foley.  Will need urology to evaluate, patient was clearly obstructive.  BMET at 7 PM.  Lasix 40 mg IV x2.  Renal U/S.  Watch for post obstructive diureses.  Adjust Abx for renal function.  GI: will need swallow evaluation.  ID: Per ID, currently on ancef for staph with repeat blood cx pending.  H/O: No active issues.  Will maintain in the ICU overnight, will likely transfer to SDU in AM and back to [facility].  Alyson Reedy, M.D. [facility] Pulmonary/Critical Care Medicine. Pager: [phone]. After hours pager: [phone].

## 2011-10-24 NOTE — Clinical Social Work Psychosocial (Signed)
     Clinical Social Work Department BRIEF PSYCHOSOCIAL ASSESSMENT 10/24/2011  Patient:  Charles Ball, Charles Ball     Account Number:  [id number]     Admit date:  10/18/2011  Clinical Social Worker:  Delmer Islam  Date/Time:  10/24/2011 12:11 PM  Referred by:  Physician  Date Referred:  10/23/2011 Referred for  SNF Placement   Other Referral:   CIR Evaluation   Interview type:  Other - See comment Other interview type:   CSW talked by phone with patient's girlfriend Rod Mae who lives with patient. Phone numbers: (h) [phone], (c) [phone], (c #2) [phone]    PSYCHOSOCIAL DATA Living Status:  SIGNIFICANT OTHER Admitted from facility:   Level of care:   Primary support name:  Rod Mae Primary support relationship to patient:  FAMILY Degree of support available:   Ms. Burgess Estelle is patient's girlfriend and they have been together for several years. She works and supports the household. Ms. Burgess Estelle reports that patient has 2 daughters but only one is supportive of patient.    CURRENT CONCERNS Current Concerns  Post-Acute Placement   Other Concerns:   Patient is uninsured. Ms. Burgess Estelle completed the Medicaid application provided by financial counselor 6/7/a.m. She will meet with [employer] disability representative on Monday, 6/10 to complete [employer] disability application.    SOCIAL WORK ASSESSMENT / PLAN CSW talked by phone with Ms. Tanner regarding d/c plans. She reported that she talked with Ms. Boyette from 4th floor and patient not eligible for inpatient rehab as there is no one that can be with him 24/7 at d/c. Ms. Burgess Estelle says she works and is the person that pays the bills as patient has not been able to work. He has 2 daughters and one is a Engineer, civil (consulting) with one child and one on the way, but she does what she can for patient. The other daughter is "scattered" per Ms. Tanner and is not supportive. Ms. Burgess Estelle is agreeable to short-term rehab and was advised that the skilled facility  list would be in the night stand in patient's room.   Assessment/plan status:  Psychosocial Support/Ongoing Assessment of Needs Other assessment/ plan:   Information/referral to community resources:   Skilled nursing facility list    PATIENTS/FAMILYS RESPONSE TO PLAN OF CARE: Ms. Burgess Estelle is very open and receptive to talking with CSW. She expressed appreciation for CSW's assistance and hopes to meet with CSW on 6/10.

## 2011-10-25 ENCOUNTER — Inpatient Hospital Stay (HOSPITAL_COMMUNITY): Payer: Medicaid Other

## 2011-10-25 LAB — ANAEROBIC CULTURE: Gram Stain: NONE SEEN

## 2011-10-25 LAB — CBC
MCH: 29.4 pg (ref 26.0–34.0)
MCV: 85.6 fL (ref 78.0–100.0)
Platelets: 235 10*3/uL (ref 150–400)
RBC: 4.36 MIL/uL (ref 4.22–5.81)

## 2011-10-25 LAB — BASIC METABOLIC PANEL
CO2: 22 mEq/L (ref 19–32)
Calcium: 8.8 mg/dL (ref 8.4–10.5)
Creatinine, Ser: 1.69 mg/dL — ABNORMAL HIGH (ref 0.50–1.35)
Glucose, Bld: 133 mg/dL — ABNORMAL HIGH (ref 70–99)

## 2011-10-25 LAB — POCT I-STAT 3, ART BLOOD GAS (G3+)
TCO2: 26 mmol/L (ref 0–100)
pCO2 arterial: 34.7 mmHg — ABNORMAL LOW (ref 35.0–45.0)
pH, Arterial: 7.47 — ABNORMAL HIGH (ref 7.350–7.450)

## 2011-10-25 LAB — GLUCOSE, CAPILLARY
Glucose-Capillary: 104 mg/dL — ABNORMAL HIGH (ref 70–99)
Glucose-Capillary: 121 mg/dL — ABNORMAL HIGH (ref 70–99)
Glucose-Capillary: 125 mg/dL — ABNORMAL HIGH (ref 70–99)

## 2011-10-25 MED ORDER — CEFAZOLIN SODIUM 1-5 GM-% IV SOLN
1.0000 g | Freq: Three times a day (TID) | INTRAVENOUS | Status: DC
  Administered 2011-10-25 – 2011-10-26 (×3): 1 g via INTRAVENOUS
  Filled 2011-10-25 (×5): qty 50

## 2011-10-25 NOTE — Progress Notes (Signed)
Name: Charles Ball MRN: [id number] DOB: 04/30/1951    LOS: 7  PULMONARY / CRITICAL CARE MEDICINE FOLLOW UP NOTE  HPI: 61 year old male admitted initially to [facility] 6/1 for presumed meningitis. He was found down with decreased movement of all four extremities. An MRI of the cervical spine showed C4-C5 and C5-C6 herniation and was transferred emergently to [facility] 6/2 for surgical decompression. Of note his blood cultures grew GPC 4/4. Was followed post op by PCCM for vent management, extubated 6/3 and tx to floor.  On 6/7 he was tx back to ICU with "anuric" renal failure and resp distress.  Foley was placed in ICU with large volume uop and resolution of resp distress.    Subjective/ Overnight No distress. No new complaints   Vital Signs: Temp:  [97.4 F (36.3 C)-98.4 F (36.9 C)] 97.6 F (36.4 C) (06/08 0800) Pulse Rate:  [72-94] 78  (06/08 0600) Resp:  [12-20] 17  (06/08 0600) BP: (81-122)/(44-71) 93/63 mmHg (06/08 0600) SpO2:  [91 %-98 %] 95 % (06/08 0600) Weight:  [244 lb 0.8 oz (110.7 kg)-258 lb 13.1 oz (117.4 kg)] 244 lb 0.8 oz (110.7 kg) (06/08 0600)  Physical Examination: General:  NAD Neuro: Drowsy, easily arousable, appropriate, Per nsg was "awake all night until 5am with hx insomnia"  L hemiparesis HEENT:  PERRL, pink conjunctivae, moist membranes Neck:  Supple, no JVD. ACDF scar noted Cardiovascular:  RRR, no M/R/G Lungs:  Clear Abdomen:  Soft, nontender, nondistended, bowel sounds present Ext: no edema, small area mid chest skin tear/sloughing   RAD US Renal Port  10/24/2011  *RADIOLOGY REPORT*  Clinical Data: Renal insufficiency.  Question hydronephrosis.  RENAL/URINARY TRACT ULTRASOUND COMPLETE - PORTABLE  Comparison:  None.  Findings:  Right Kidney:  No hydronephrosis.  Well-preserved cortex.  Normal size and parenchymal echotexture without focal abnormalities. Renal length 11.8 cm.  Left Kidney:  The left kidney is poorly visualized due to bowel gas.  The patient  was unable to position for better imaging of the left kidney.  Its estimated length is approximately 10.3 cm.  There is no gross hydronephrosis or other abnormality.  Bladder:  Decompressed by a Foley catheter.  IMPRESSION: No evidence of hydronephrosis.  Please note the left kidney is poorly visualized on this portable examination.  Original Report Authenticated By: Gerrianne Scale, M.D.   Dg Chest Port 1 View  10/24/2011  *RADIOLOGY REPORT*  Clinical Data: Chest congestion  PORTABLE CHEST - 1 VIEW  Comparison: 10/23/2011  Findings: The lung volumes are suboptimally inflated.  Persistent opacity within the left base noted which may be due to atelectasis and/or consolidation.  Pulmonary vascular congestion appears increased from previous exam.  IMPRESSION:  1.  Persistent low lung volumes. 2.  Pulmonary vascular congestion. 3.  Persistent diminished aeration to the left base  Original Report Authenticated By: Rosealee Albee, M.D.   Dg Chest Port 1v Same Day  10/23/2011  *RADIOLOGY REPORT*  Clinical Data: Chest congestion and wheezing.  PORTABLE CHEST - 1 VIEW SAME DAY  Comparison: Chest x-ray 10/21/2011.  Findings: Lung volumes are low.  There is a left basilar opacity obscuring the left hemidiaphragm and blunting of the left costophrenic sulcus.  Right lung appears clear.  There is pulmonary venous congestion without frank pulmonary edema.  Heart size is mildly enlarged (unchanged). The patient is rotated to the left on today's exam, resulting in distortion of the mediastinal contours and reduced diagnostic sensitivity and specificity for mediastinal pathology.  Atherosclerotic calcifications within the thoracic aorta.  Orthopedic fixation hardware incompletely visualized in the lower cervical spine.  IMPRESSION: 1.  Low lung volumes with atelectasis and/or consolidation in the left lower lobe, with superimposed small left-sided pleural effusion. 2.  Mild cardiomegaly with pulmonary venous congestion. 3.   Atherosclerosis  Original Report Authenticated By: Florencia Reasons, M.D.      Intake/Output Summary (Last 24 hours) at 10/25/11 0835 Last data filed at 10/25/11 0600  Gross per 24 hour  Intake     50 ml  Output   8570 ml  Net  -8520 ml    BMET    Component Value Date/Time   NA 135 10/25/2011 0405   K 4.4 10/25/2011 0405   CL 98 10/25/2011 0405   CO2 22 10/25/2011 0405   GLUCOSE 133* 10/25/2011 0405   BUN 59* 10/25/2011 0405   CREATININE 1.69* 10/25/2011 0405   CALCIUM 8.8 10/25/2011 0405   GFRNONAA 43* 10/25/2011 0405   GFRAA 50* 10/25/2011 0405   CBC    Component Value Date/Time   WBC 17.6* 10/25/2011 0405   RBC 4.36 10/25/2011 0405   HGB 12.8* 10/25/2011 0405   HCT 37.3* 10/25/2011 0405   PLT 235 10/25/2011 0405   MCV 85.6 10/25/2011 0405   MCH 29.4 10/25/2011 0405   MCHC 34.3 10/25/2011 0405   RDW 13.7 10/25/2011 0405   LYMPHSABS 0.5* 10/19/2011 1006   MONOABS 0.4 10/19/2011 1006   EOSABS 0.0 10/19/2011 1006   BASOSABS 0.0 10/19/2011 1006   ASSESSMENT AND PLAN  61 year old male with history of an epidural abscess post op during this admission.  Was intubated 6/1 for respiratory failure and sepsis that resolved.  Extubated 6/3 and transferred out to the floor on 6/4 to be transferred back to the ICU on 6/7 with new onset renal failure and aneuria.  Foley placed and patient was given lasix with improvement in respiratory "failure" and resolution of aneuria.  Pulmonary:  The vast majority of his respiratory "failure" was due to pain from bladder and fluid overload and is resolved.   Plan:  KVO IVF  Hold on further lasix for now   F/U BMET.  O2.  Swallow evaluation - see below   Cardiac: HR and BP ok. Plan: Continue diureses.  Monitor BP.  Renal: acute renal failure, likely related to post renal azotemia. Renal u/s neg hydro.  Plan: Keep foley.  Will need urology to evaluate, patient was clearly obstructed.  F/u chem   Watch for post obstructive diureses.  Adjust Abx for renal function.  Neuro:   Cervical discitis/ epidural abscess.  C4-C5, C5-C6 compression with L hemiparesis s/p decompression Patent examiner)  Plan:   Ultimately will need CIR    Endocrine:  DM PLAN:   Cont SSI, lantus   GI: Cards and GI have been unable to pass scope for TEE.  Will need swallow evaluation.  ID: Staph bacteremia/ cervical discitis/ epidural abscess PLAN:  Per ID, currently on ancef for staph with repeat blood cx pending.  Needs TEE   H/O: No active issues.  Will tx back to floor and [facility] to assume care in am 6/9.    Danford Bad, NP 10/25/2011  8:33 AM Pager: [phone]  *Care during the described time interval was provided by me and/or other providers on the critical care team. I have reviewed this patient's available data, including medical history, events of note, physical examination and test results as part of my evaluation.  Patient much  improved now that he is no longer obstructed with foley in.  Please do not remove foley until patient is evaluated by urology to avoid post obstructive uropathy.  Continue to follow BMET.  Transfer back to 6700 and back to [facility]'s service.  PCCM will sign off, please call back if needed.  Patient seen and examined, agree with above note.  I dictated the care and orders written for this patient under my direction.  Koren Bound, M.D. [phone]

## 2011-10-25 NOTE — Procedures (Signed)
Objective Swallowing Evaluation: Modified Barium Swallowing Study  Patient Details  Name: Charles Ball MRN: [id number] Date of Birth: Dec 01, 1950  Today's Date: 10/25/2011 Time: 1400-1430 SLP Time Calculation (min): 30 min  Past Medical History:  Past Medical History  Diagnosis Date  . Hypertension   . Insomnia secondary to chronic pain   . Generalized headaches   . 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 Anxiety disorder    Past Surgical History:  Past Surgical History  Procedure Date  . Right rotator cuff repair   . Left calcaneal surgery   . Anterior cervical decomp/discectomy fusion 10/19/2011    Procedure: ANTERIOR CERVICAL DECOMPRESSION/DISCECTOMY FUSION 2 LEVELS;  Surgeon: Reinaldo Meeker, MD;  Location: [facility] NEURO ORS;  Service: Neurosurgery;  Laterality: N/A;  Cervical four-five, five-six Anterior Cervical Decompression Fusion  . Tee without cardioversion 10/23/2011    Procedure: TRANSESOPHAGEAL ECHOCARDIOGRAM (TEE);  Surgeon: Peter M Swaz[place], MD;  Location: [facility] ENDOSCOPY;  Service: Cardiovascular;  Laterality: N/A;   HPI:  61 y/o male admitted to [facility] ED s/p fall. Intubated from 6/1 to 6/3. Transferred emergently to [facility] on 6/2 for surgical decompression  of C-4 - C-5 and C-5 to C-6. TEE attempted on 6/6 but not completed secondary to inabiltity to pass TEE scope into esophagus.      Assessment / Plan / Recommendation Clinical Impression  Clinical impression: Functional oropharyngeal swallow with no observed s/s of aspiration noted with PO trials. All swallows initiated at valleculae with adequate airway protection. Patient noted to fatigue easily with solid trials. Recommend to proceed with dysphagia 2 ( finely chopped) diet consistency and thin liquids with full assist and supervision  with all meals. Recommend modified diet of dysphagia 2 due to noted fatigue and decreased duration with regular solids. ST to follow for diet tolerance and safe, diet advancement in  acute care setting.     Treatment Recommendation  Therapy as outlined in treatment plan below    Diet Recommendation Dysphagia 2 (Fine chop);Thin liquid   Liquid Administration via: Cup;Straw Medication Administration: Whole meds with puree Supervision: Full supervision/cueing for compensatory strategies Compensations: Slow rate;Small sips/bites;Multiple dry swallows after each bite/sip;Follow solids with liquid Postural Changes and/or Swallow Maneuvers: Seated upright 90 degrees;Upright 30-60 min after meal    Other  Recommendations Oral Care Recommendations: Oral care before and after PO Other Recommendations: Clarify dietary restrictions   Follow Up Recommendations  Skilled Nursing facility    Frequency and Duration min 2x/week  2 weeks       SLP Swallow Goals Patient will consume recommended diet without observed clinical signs of aspiration with: Moderate assistance Patient will utilize recommended strategies during swallow to increase swallowing safety with: Moderate assistance Goal #3: Goals pending results of MBS   General Date of Onset: 10/18/11 HPI: 61 y/o male admitted to [facility] ED s/p fall. Intubated from 6/1 to 6/3. Transferred emergently to [facility] on 6/2 for surgical decompression  of C-4 - C-5 and C-5 to C-6. TEE attempted on 6/6 but not completed secondary to inabiltity to pass TEE scope into esophagus.  Type of Study: Modified Barium Swallowing Study Reason for Referral: Objectively evaluate swallowing function Previous Swallow Assessment: BSE 10/25/11 Diet Prior to this Study: NPO Temperature Spikes Noted: No Respiratory Status: Other (comment) (nasal cannula) History of Recent Intubation: Yes Length of Intubations (days): 6/1 to 6/3 6/6 to 6/7 Date extubated: 10/24/11 Behavior/Cognition: Agitated;Impulsive;Distractible;Requires cueing Oral Cavity - Dentition: Poor condition;Missing dentition Oral  Motor / Sensory Function: Within functional limits Self-Feeding  Abilities: Total assist Patient Positioning: Upright in chair Baseline Vocal Quality: Clear Volitional Cough: Strong Volitional Swallow: Able to elicit Pharyngeal Secretions: Not observed secondary MBS    Reason for Referral Objectively evaluate swallowing function   Oral Phase   WFL  Pharyngeal Phase Pharyngeal Phase: Within functional limits   Cervical Esophageal Phase   Unable to assess due to positioning.  Moreen Fowler MS, CCC-SLP [phone] [facility] 10/25/2011, 4:10 PM

## 2011-10-25 NOTE — Progress Notes (Signed)
ANTIBIOTIC CONSULT NOTE - FOLLOW UP  Pharmacy Consult for Ancef Indication: MSSA bacteremia  No Known Allergies  Patient Measurements: Height: 5\' 10"  (177.8 cm) Weight: 244 lb 0.8 oz (110.7 kg) IBW/kg (Calculated) : 73   Vital Signs: Temp: 98.7 F (37.1 C) (06/08 1031) Temp src: Oral (06/08 1031) BP: 109/70 mmHg (06/08 1031) Pulse Rate: 78  (06/08 1031) Intake/Output from previous day: 06/07 0701 - 06/08 0700 In: 50 [IV Piggyback:50] Out: 8570 [Urine:8570] Intake/Output from this shift: Total I/O In: -  Out: 400 [Urine:400]  Labs:  [facility] 10/25/11 0405 10/24/11 2230 10/24/11 1100 10/24/11 0810 10/23/11 0635  WBC 17.6* -- -- 15.1* 20.9*  HGB 12.8* -- -- 12.5* 13.7  PLT 235 -- -- 153 168  LABCREA -- -- -- -- --  CREATININE 1.69* 2.24* 5.96* -- --   Estimated Creatinine Clearance: 59.4 ml/min (by C-G formula based on Cr of 1.69). No results found for this basename: VANCOTROUGH:2,VANCOPEAK:2,VANCORANDOM:2,GENTTROUGH:2,GENTPEAK:2,GENTRANDOM:2,TOBRATROUGH:2,TOBRAPEAK:2,TOBRARND:2,AMIKACINPEAK:2,AMIKACINTROU:2,AMIKACIN:2, in the last 72 hours   Microbiology: Recent Results (from the past 720 hour(s))  CULTURE, BLOOD (ROUTINE X 2)     Status: Normal   Collection Time   10/18/11  6:55 PM      Component Value Range Status Comment   Specimen Description BLOOD RIGHT ARM  5 ML IN [facility] BOTTLE   Final    Special Requests NONE   Final    Culture  Setup Time [id number]   Final    Culture     Final    Value: STAPHYLOCOCCUS AUREUS     Note: RIFAMPIN AND GENTAMICIN SHOULD NOT BE USED AS SINGLE DRUGS FOR TREATMENT OF STAPH INFECTIONS.     Note: Gram Stain Report Called to,Read Back By and Verified With: RN S. DILLON ON 10/19/11 AT 1500 BY DTERRY   Report Status 10/21/2011 FINAL   Final    Organism ID, Bacteria STAPHYLOCOCCUS AUREUS   Final   URINE CULTURE     Status: Normal   Collection Time   10/18/11  8:14 PM      Component Value Range Status Comment   Specimen Description URINE,  CATHETERIZED   Final    Special Requests NONE   Final    Culture  Setup Time [id number]   Final    Colony Count NO GROWTH   Final    Culture NO GROWTH   Final    Report Status 10/20/2011 FINAL   Final   CULTURE, BLOOD (ROUTINE X 2)     Status: Normal   Collection Time   10/18/11  9:15 PM      Component Value Range Status Comment   Specimen Description BLOOD LEFT HAND   Final    Special Requests BOTTLES DRAWN AEROBIC AND ANAEROBIC 4CC   Final    Culture  Setup Time [id number]   Final    Culture     Final    Value: STAPHYLOCOCCUS AUREUS     Note: SUSCEPTIBILITIES PERFORMED ON PREVIOUS CULTURE WITHIN THE LAST 5 DAYS.     Note: Gram Stain Report Called to,Read Back By and Verified With: RN S. DILLON ON 10/19/11 AT 1840 BY DTERRY   Report Status 10/21/2011 FINAL   Final   CSF CULTURE     Status: Normal   Collection Time   10/18/11 11:45 PM      Component Value Range Status Comment   Specimen Description CSF   Final    Special Requests Normal   Final    Gram Stain  Final    Value: CYTOSPIN SLIDE WBC PRESENT,BOTH PMN AND MONONUCLEAR     NO ORGANISMS SEEN     Gram Stain Report Called to,Read Back By and Verified With: Gram Stain Report Called to,Read Back By and Verified With: TWOODS RN AT 0141 ON [id number] BY DLONG Performed by [facility]   Culture NO GROWTH 3 DAYS   Final    Report Status 10/22/2011 FINAL   Final   GRAM STAIN     Status: Normal   Collection Time   10/18/11 11:45 PM      Component Value Range Status Comment   Specimen Description CSF   Final    Special Requests Normal   Final    Gram Stain     Final    Value: WBC PRESENT,BOTH PMN AND MONONUCLEAR     NO ORGANISMS SEEN     CYTO SPUN SLIDE     Gram Stain Report Called to,Read Back By and Verified With: TWOODS RN AT 0141 ON [id number] BY DLONG   Report Status 10/19/2011 FINAL   Final   MRSA PCR SCREENING     Status: Normal   Collection Time   10/19/11 12:23 AM      Component Value Range Status Comment   MRSA by PCR  NEGATIVE  NEGATIVE  Final   WOUND CULTURE     Status: Normal   Collection Time   10/19/11  8:29 PM      Component Value Range Status Comment   Specimen Description WOUND NECK CERVICAL FOUR FIVE DISC SPACE   Final    Special Requests NONE   Final    Gram Stain     Final    Value: RARE WBC PRESENT, PREDOMINANTLY MONONUCLEAR     NO ORGANISMS SEEN     Performed at [facility]   Culture     Final    Value: MODERATE STAPHYLOCOCCUS AUREUS     Note: SUSCEPTIBILITIES PERFORMED ON PREVIOUS CULTURE WITHIN THE LAST 5 DAYS.   Report Status 10/22/2011 FINAL   Final   AFB CULTURE WITH SMEAR     Status: Normal (Preliminary result)   Collection Time   10/19/11  8:29 PM      Component Value Range Status Comment   Specimen Description WOUND NECK CERVICAL FOUR FIVE DISC SPACE   Final    Special Requests NONE   Final    ACID FAST SMEAR NO ACID FAST BACILLI SEEN   Final    Culture     Final    Value: CULTURE WILL BE EXAMINED FOR 6 WEEKS BEFORE ISSUING A FINAL REPORT   Report Status PENDING   Incomplete   GRAM STAIN     Status: Normal   Collection Time   10/19/11  8:29 PM      Component Value Range Status Comment   Specimen Description WOUND NECK CERVICAL FOUR FIVE DISC SPACE   Final    Special Requests NONE   Final    Gram Stain     Final    Value: RARE WBC PRESENT, PREDOMINANTLY MONONUCLEAR     NO ORGANISMS SEEN   Report Status 10/19/2011 FINAL   Final   ANAEROBIC CULTURE     Status: Normal (Preliminary result)   Collection Time   10/19/11  8:33 PM      Component Value Range Status Comment   Specimen Description NECK CERVICAL FOUR FIVE DISC SPACE   Final    Special Requests NONE   Final  Gram Stain PENDING   Incomplete    Culture     Final    Value: NO ANAEROBES ISOLATED; CULTURE IN PROGRESS FOR 5 DAYS   Report Status PENDING   Incomplete   TISSUE CULTURE     Status: Normal   Collection Time   10/19/11  8:34 PM      Component Value Range Status Comment   Specimen Description TISSUE NECK  CERVICAL FOUR FIVE DISC   Final    Special Requests NONE   Final    Gram Stain     Final    Value: NO WBC SEEN     NO ORGANISMS SEEN     Performed at [facility]   Culture     Final    Value: MODERATE STAPHYLOCOCCUS AUREUS     Note: RIFAMPIN AND GENTAMICIN SHOULD NOT BE USED AS SINGLE DRUGS FOR TREATMENT OF STAPH INFECTIONS.   Report Status 10/22/2011 FINAL   Final    Organism ID, Bacteria STAPHYLOCOCCUS AUREUS   Final   AFB CULTURE WITH SMEAR     Status: Normal (Preliminary result)   Collection Time   10/19/11  8:34 PM      Component Value Range Status Comment   Specimen Description TISSUE NECK CERVICAL FOUR FIVE DISC   Final    Special Requests NONE   Final    ACID FAST SMEAR NO ACID FAST BACILLI SEEN   Final    Culture     Final    Value: CULTURE WILL BE EXAMINED FOR 6 WEEKS BEFORE ISSUING A FINAL REPORT   Report Status PENDING   Incomplete   GRAM STAIN     Status: Normal   Collection Time   10/19/11  8:34 PM      Component Value Range Status Comment   Specimen Description TISSUE NECK CERVICAL FOUR FIVE DISC   Final    Special Requests NONE   Final    Gram Stain     Final    Value: NO WBC SEEN     NO ORGANISMS SEEN   Report Status 10/19/2011 FINAL   Final   ANAEROBIC CULTURE     Status: Normal (Preliminary result)   Collection Time   10/19/11  8:37 PM      Component Value Range Status Comment   Specimen Description TISSUE CERIVAL FOUR FIVE DISC   Final    Special Requests NONE   Final    Gram Stain PENDING   Incomplete    Culture     Final    Value: NO ANAEROBES ISOLATED; CULTURE IN PROGRESS FOR 5 DAYS   Report Status PENDING   Incomplete   WOUND CULTURE     Status: Normal   Collection Time   10/19/11 10:14 PM      Component Value Range Status Comment   Specimen Description WOUND NECK CERVICAL FOUR FIVE EPIDURAL DISC SPACE   Final    Special Requests NONE   Final    Gram Stain     Final    Value: FEW WBC PRESENT,BOTH PMN AND MONONUCLEAR     RARE GRAM POSITIVE COCCI  IN PAIRS     Performed at [facility]   Culture     Final    Value: MODERATE STAPHYLOCOCCUS AUREUS     Note: RIFAMPIN AND GENTAMICIN SHOULD NOT BE USED AS SINGLE DRUGS FOR TREATMENT OF STAPH INFECTIONS.   Report Status 10/22/2011 FINAL   Final    Organism ID, Bacteria  STAPHYLOCOCCUS AUREUS   Final   ANAEROBIC CULTURE     Status: Normal (Preliminary result)   Collection Time   10/19/11 10:14 PM      Component Value Range Status Comment   Specimen Description NECK CERVICAL FOUR FIVE EPIDURAL DISC SPACE   Final    Special Requests NONE   Final    Gram Stain PENDING   Incomplete    Culture     Final    Value: NO ANAEROBES ISOLATED; CULTURE IN PROGRESS FOR 5 DAYS   Report Status PENDING   Incomplete   GRAM STAIN     Status: Normal   Collection Time   10/19/11 10:14 PM      Component Value Range Status Comment   Specimen Description NECK CERVICAL FOUR FIVE WPIDURAL DISC SPACE   Final    Special Requests NONE   Final    Gram Stain     Final    Value: FEW WBC PRESENT,BOTH PMN AND MONONUCLEAR     RARE GRAM POSITIVE COCCI IN PAIRS   Report Status 10/19/2011 FINAL   Final   AFB CULTURE WITH SMEAR     Status: Normal (Preliminary result)   Collection Time   10/19/11 10:14 PM      Component Value Range Status Comment   Specimen Description NECK CERVICAL FOUR FIVE WPIDURAL DISC SPACE   Final    Special Requests NONE   Final    ACID FAST SMEAR NO ACID FAST BACILLI SEEN   Final    Culture     Final    Value: CULTURE WILL BE EXAMINED FOR 6 WEEKS BEFORE ISSUING A FINAL REPORT   Report Status PENDING   Incomplete   CULTURE, BLOOD (ROUTINE X 2)     Status: Normal (Preliminary result)   Collection Time   10/20/11  4:40 PM      Component Value Range Status Comment   Specimen Description BLOOD LEFT HAND   Final    Special Requests BOTTLES DRAWN AEROBIC AND ANAEROBIC 5CC   Final    Culture  Setup Time [id number]   Final    Culture     Final    Value:        BLOOD CULTURE RECEIVED NO GROWTH TO  DATE CULTURE WILL BE HELD FOR 5 DAYS BEFORE ISSUING A FINAL NEGATIVE REPORT   Report Status PENDING   Incomplete   MRSA PCR SCREENING     Status: Normal   Collection Time   10/24/11  2:42 PM      Component Value Range Status Comment   MRSA by PCR NEGATIVE  NEGATIVE  Final     Anti-infectives     Start     Dose/Rate Route Frequency Ordered Stop   10/24/11 2200   ceFAZolin (ANCEF) IVPB 1 g/50 mL premix        1 g 100 mL/hr over 30 Minutes Intravenous Every 12 hours 10/24/11 1749     10/23/11 2200   ceFAZolin (ANCEF) IVPB 1 g/50 mL premix  Status:  Discontinued        1 g 100 mL/hr over 30 Minutes Intravenous Every 12 hours 10/23/11 1739 10/24/11 1719   10/21/11 1400   ceFAZolin (ANCEF) IVPB 1 g/50 mL premix  Status:  Discontinued        1 g 100 mL/hr over 30 Minutes Intravenous 3 times per day 10/21/11 1202 10/23/11 1739   10/19/11 2007   bacitracin 82956 UNITS injection     Comments:  [name], [name]: cabinet override         10/19/11 2007 10/20/11 0814   10/19/11 2005   bacitracin 50,000 Units in sodium chloride irrigation 0.9 % 500 mL irrigation  Status:  Discontinued          As needed 10/19/11 2058 10/19/11 2209   10/19/11 1200   vancomycin (VANCOCIN) 1,250 mg in sodium chloride 0.9 % 250 mL IVPB  Status:  Discontinued        1,250 mg 166.7 mL/hr over 90 Minutes Intravenous Every 12 hours 10/18/11 2157 10/21/11 1202   10/19/11 0200   acyclovir (ZOVIRAX) 930 mg in dextrose 5 % 150 mL IVPB  Status:  Discontinued        10 mg/kg  92.8 kg (Adjusted) 168.6 mL/hr over 60 Minutes Intravenous 3 times per day 10/19/11 0156 10/20/11 1607   10/18/11 2230   ampicillin (OMNIPEN) 2 g in sodium chloride 0.9 % 50 mL IVPB  Status:  Discontinued        2 g 150 mL/hr over 20 Minutes Intravenous Every 4 hours 10/18/11 2130 10/20/11 1607   10/18/11 2200   cefTRIAXone (ROCEPHIN) 2 g in dextrose 5 % 50 mL IVPB  Status:  Discontinued        2 g 100 mL/hr over 30 Minutes Intravenous Every 12  hours 10/18/11 2130 10/20/11 1607   10/18/11 2200   vancomycin (VANCOCIN) IVPB 1000 mg/200 mL premix        1,000 mg 200 mL/hr over 60 Minutes Intravenous  Once 10/18/11 2156 10/19/11 0258   10/18/11 2045   piperacillin-tazobactam (ZOSYN) IVPB 3.375 g  Status:  Discontinued        3.375 g 100 mL/hr over 30 Minutes Intravenous  Once 10/18/11 2029 10/18/11 2204   10/18/11 2045   vancomycin (VANCOCIN) IVPB 1000 mg/200 mL premix        1,000 mg 200 mL/hr over 60 Minutes Intravenous  Once 10/18/11 2029 10/18/11 2235         Assessment:  60 yom with MSSA bacteremia who is currently receiving Ancef 1gm IV Q12h. Noted pt had worsening of renal function which is now improving. UOP 3.2 ml/kg/24h.  Lab 10/25/11 0405 10/24/11 2230 10/24/11 1100 10/23/11 0635 10/21/11 0350  CREATININE 1.69* 2.24* 5.96* 3.39* 0.78   Pt is afebrile and wbc 17.6. Recent blood culture NGTD. Noted TEE was unable to be conducted and pt is to go for another.   Goal of Therapy:  Clearance of bacteremia, renal adj abx  Plan:  Increase ancef to 1g q8h F/u TEE to determine need for higher dose Continue to f/u renal function.   Thank you,  Brett Fairy, PharmD Pager: [phone]  10/25/2011 10:38 AM

## 2011-10-25 NOTE — Evaluation (Signed)
Clinical/Bedside Swallow Evaluation Patient Details  Name: Charles Ball MRN: [id number] Date of Birth: [birth year]  Today's Date: 10/25/2011 Time: 1230-1300 SLP Time Calculation (min): 30 min  Past Medical History:  Past Medical History  Diagnosis Date  . Hypertension   . Insomnia secondary to chronic pain   . Generalized headaches   . 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 Anxiety disorder    Past Surgical History:  Past Surgical History  Procedure Date  . Right rotator cuff repair   . Left calcaneal surgery   . Anterior cervical decomp/discectomy fusion 10/19/2011    Procedure: ANTERIOR CERVICAL DECOMPRESSION/DISCECTOMY FUSION 2 LEVELS;  Surgeon: Reinaldo Meeker, MD;  Location: [facility];  Service: Neurosurgery;  Laterality: N/A;  Cervical four-five, five-six Anterior Cervical Decompression Fusion  . Tee without cardioversion 10/23/2011    Procedure: TRANSESOPHAGEAL ECHOCARDIOGRAM (TEE);  Surgeon: Peter M [place]az[place], MD;  Location: [facility] ENDOSCOPY;  Service: Cardiovascular;  Laterality: N/A;   HPI:  61 y/o male transferred from home to [facility] ED s/p fall on 10/18/11.  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 Patient transferred emergently to [facility] on 6/2 for surgical decompression. Patient intubated from 10/18/11 to 6/3 and 6/6 to 6/7. TEE attempted on 6/6  but not completed as unable to pass TEE scope into esophagus. Patient referred for BSE following extubation to assess risk for aspiration. Attempt x1 to complete BSE on 10/24/11 but not completed as rapid response in room due to concerns with respiratory status at time of attempt.   Assessment / Plan / Recommendation Clinical Impression  Moderate oropharyngeal dysphagia with +s/s of aspiration s/p swallow of all PO trials. Recommend objective assessment of MBS to assess risk for aspiration.     Aspiration Risk  Severe    Diet Recommendation NPO        Other  Recommendations   Proceed with objective  assessment of MBS to assess risk for aspiration.      Frequency and Duration min 2x/week  2 weeks    SLP Swallow Goals Goal #3: Goals pending results of MBS   Swallow Study Prior Functional Status      General Date of Onset: 10/18/11  Type of Study: Bedside swallow evaluation Previous Swallow Assessment: n/a Diet Prior to this Study: NPO Temperature Spikes Noted: No Respiratory Status: Supplemental O2 delivered via (comment) (nasal cannula) History of Recent Intubation: Yes Length of Intubations (days): 7 days Date extubated: 10/24/11 Behavior/Cognition: Alert;Impulsive;Agitated;Distractible;Requires cueing Oral Cavity - Dentition: Poor condition;Missing dentition Self-Feeding Abilities: Total assist Patient Positioning: Upright in bed Baseline Vocal Quality: Wet Volitional Cough: Strong Volitional Swallow: Able to elicit    Oral/Motor/Sensory Function Overall Oral Motor/Sensory Function: Appears within functional limits for tasks assessed   Ice Chips Ice chips: Impaired Presentation: Spoon Pharyngeal Phase Impairments: Suspected delayed Swallow;Throat Clearing - Immediate;Wet Vocal Quality   Thin Liquid Thin Liquid: Impaired Presentation: Cup Pharyngeal  Phase Impairments: Suspected delayed Swallow;Decreased hyoid-laryngeal movement;Wet Vocal Quality    Nectar Thick Nectar Thick Liquid: Not tested   Honey Thick Honey Thick Liquid: Not tested   Puree Puree: Impaired Presentation: Spoon Pharyngeal Phase Impairments: Suspected delayed Swallow;Decreased hyoid-laryngeal movement;Throat Clearing - Immediate;Wet Vocal Quality   Solid Solid: Not tested   Moreen Fowler MS, CCC-SLP [phone] [facility] 10/25/2011,2:38 PM

## 2011-10-26 DIAGNOSIS — G629 Polyneuropathy, unspecified: Secondary | ICD-10-CM | POA: Diagnosis present

## 2011-10-26 LAB — GLUCOSE, CAPILLARY
Glucose-Capillary: 113 mg/dL — ABNORMAL HIGH (ref 70–99)
Glucose-Capillary: 128 mg/dL — ABNORMAL HIGH (ref 70–99)
Glucose-Capillary: 133 mg/dL — ABNORMAL HIGH (ref 70–99)

## 2011-10-26 LAB — BASIC METABOLIC PANEL
BUN: 34 mg/dL — ABNORMAL HIGH (ref 6–23)
Chloride: 100 mEq/L (ref 96–112)
GFR calc Af Amer: >90 mL/min (ref >90)
Glucose, Bld: 145 mg/dL — ABNORMAL HIGH (ref 70–99)
Potassium: 4.9 mEq/L (ref 3.5–5.1)

## 2011-10-26 LAB — CBC
HCT: 39.2 % (ref 39.0–52.0)
Hemoglobin: 13.4 g/dL (ref 13.0–17.0)
MCH: 29.8 pg (ref 26.0–34.0)
MCHC: 34.2 g/dL (ref 30.0–36.0)
RBC: 4.5 MIL/uL (ref 4.22–5.81)

## 2011-10-26 LAB — PHOSPHORUS: Phosphorus: 2.9 mg/dL (ref 2.3–4.6)

## 2011-10-26 MED ORDER — GABAPENTIN 600 MG PO TABS
300.0000 mg | ORAL_TABLET | Freq: Every day | ORAL | Status: DC
  Administered 2011-10-26: 300 mg via ORAL
  Filled 2011-10-26 (×2): qty 0.5

## 2011-10-26 MED ORDER — CEFAZOLIN SODIUM-DEXTROSE 2-3 GM-% IV SOLR
2.0000 g | Freq: Three times a day (TID) | INTRAVENOUS | Status: DC
Start: 2011-10-26 — End: 2011-10-31
  Administered 2011-10-26 – 2011-10-31 (×16): 2 g via INTRAVENOUS
  Filled 2011-10-26 (×18): qty 50

## 2011-10-26 MED ORDER — OXYCODONE HCL 5 MG PO TABS
15.0000 mg | ORAL_TABLET | ORAL | Status: DC | PRN
  Administered 2011-10-26 – 2011-10-31 (×15): 15 mg via ORAL
  Filled 2011-10-26 (×8): qty 3
  Filled 2011-10-26: qty 1
  Filled 2011-10-26 (×8): qty 3

## 2011-10-26 MED ORDER — OXYCODONE HCL 20 MG PO TB12
20.0000 mg | ORAL_TABLET | Freq: Two times a day (BID) | ORAL | Status: DC
  Administered 2011-10-26 – 2011-10-27 (×3): 20 mg via ORAL
  Filled 2011-10-26 (×3): qty 1

## 2011-10-26 MED ORDER — INSULIN ASPART 100 UNIT/ML ~~LOC~~ SOLN
0.0000 [IU] | Freq: Three times a day (TID) | SUBCUTANEOUS | Status: DC
  Administered 2011-10-30 – 2011-10-31 (×3): 2 [IU] via SUBCUTANEOUS

## 2011-10-26 NOTE — Progress Notes (Signed)
ANTIBIOTIC CONSULT NOTE - FOLLOW UP  Pharmacy Consult for Ancef Indication: MSSA bacteremia  No Known Allergies  Patient Measurements: Height: 5\' 10"  (177.8 cm) Weight: 244 lb 3.2 oz (110.768 kg) IBW/kg (Calculated) : 73   Vital Signs: Temp: 98.2 F (36.8 C) (06/09 0402) Temp src: Oral (06/09 0402) BP: 137/85 mmHg (06/09 0402) Pulse Rate: 75  (06/09 0402) Intake/Output from previous day: 06/08 0701 - 06/09 0700 In: 1280 [P.O.:1280] Out: 2825 [Urine:2825] Intake/Output from this shift:    Labs:  Basename 10/26/11 0610 10/25/11 0405 10/24/11 2230 10/24/11 0810  WBC 17.2* 17.6* -- 15.1*  HGB 13.4 12.8* -- 12.5*  PLT 268 235 -- 153  LABCREA -- -- -- --  CREATININE 0.73 1.69* 2.24* --   Estimated Creatinine Clearance: 125.4 ml/min (by C-G formula based on Cr of 0.73). No results found for this basename: VANCOTROUGH:2,VANCOPEAK:2,VANCORANDOM:2,GENTTROUGH:2,GENTPEAK:2,GENTRANDOM:2,TOBRATROUGH:2,TOBRAPEAK:2,TOBRARND:2,AMIKACINPEAK:2,AMIKACINTROU:2,AMIKACIN:2, in the last 72 hours   Microbiology: Recent Results (from the past 720 hour(s))  CULTURE, BLOOD (ROUTINE X 2)     Status: Normal   Collection Time   10/18/11  6:55 PM      Component Value Range Status Comment   Specimen Description BLOOD RIGHT ARM  5 ML IN [facility] BOTTLE   Final    Special Requests NONE   Final    Culture  Setup Time [id number]   Final    Culture     Final    Value: STAPHYLOCOCCUS AUREUS     Note: RIFAMPIN AND GENTAMICIN SHOULD NOT BE USED AS SINGLE DRUGS FOR TREATMENT OF STAPH INFECTIONS.     Note: Gram Stain Report Called to,Read Back By and Verified With: RN S. DILLON ON 10/19/11 AT 1500 BY DTERRY   Report Status 10/21/2011 FINAL   Final    Organism ID, Bacteria STAPHYLOCOCCUS AUREUS   Final   URINE CULTURE     Status: Normal   Collection Time   10/18/11  8:14 PM      Component Value Range Status Comment   Specimen Description URINE, CATHETERIZED   Final    Special Requests NONE   Final    Culture   Setup Time [id number]   Final    Colony Count NO GROWTH   Final    Culture NO GROWTH   Final    Report Status 10/20/2011 FINAL   Final   CULTURE, BLOOD (ROUTINE X 2)     Status: Normal   Collection Time   10/18/11  9:15 PM      Component Value Range Status Comment   Specimen Description BLOOD LEFT HAND   Final    Special Requests BOTTLES DRAWN AEROBIC AND ANAEROBIC 4CC   Final    Culture  Setup Time [id number]   Final    Culture     Final    Value: STAPHYLOCOCCUS AUREUS     Note: SUSCEPTIBILITIES PERFORMED ON PREVIOUS CULTURE WITHIN THE LAST 5 DAYS.     Note: Gram Stain Report Called to,Read Back By and Verified With: RN S. DILLON ON 10/19/11 AT 1840 BY DTERRY   Report Status 10/21/2011 FINAL   Final   CSF CULTURE     Status: Normal   Collection Time   10/18/11 11:45 PM      Component Value Range Status Comment   Specimen Description CSF   Final    Special Requests Normal   Final    Gram Stain     Final    Value: CYTOSPIN SLIDE WBC PRESENT,BOTH PMN AND MONONUCLEAR  NO ORGANISMS SEEN     Gram Stain Report Called to,Read Back By and Verified With: Gram Stain Report Called to,Read Back By and Verified With: TWOODS RN AT 0141 ON [id number] BY DLONG Performed by [facility]   Culture NO GROWTH 3 DAYS   Final    Report Status 10/22/2011 FINAL   Final   GRAM STAIN     Status: Normal   Collection Time   10/18/11 11:45 PM      Component Value Range Status Comment   Specimen Description CSF   Final    Special Requests Normal   Final    Gram Stain     Final    Value: WBC PRESENT,BOTH PMN AND MONONUCLEAR     NO ORGANISMS SEEN     CYTO SPUN SLIDE     Gram Stain Report Called to,Read Back By and Verified With: TWOODS RN AT 0141 ON [id number] BY DLONG   Report Status 10/19/2011 FINAL   Final   MRSA PCR SCREENING     Status: Normal   Collection Time   10/19/11 12:23 AM      Component Value Range Status Comment   MRSA by PCR NEGATIVE  NEGATIVE  Final   WOUND CULTURE     Status: Normal    Collection Time   10/19/11  8:29 PM      Component Value Range Status Comment   Specimen Description WOUND NECK CERVICAL FOUR FIVE DISC SPACE   Final    Special Requests NONE   Final    Gram Stain     Final    Value: RARE WBC PRESENT, PREDOMINANTLY MONONUCLEAR     NO ORGANISMS SEEN     Performed at [facility]   Culture     Final    Value: MODERATE STAPHYLOCOCCUS AUREUS     Note: SUSCEPTIBILITIES PERFORMED ON PREVIOUS CULTURE WITHIN THE LAST 5 DAYS.   Report Status 10/22/2011 FINAL   Final   AFB CULTURE WITH SMEAR     Status: Normal (Preliminary result)   Collection Time   10/19/11  8:29 PM      Component Value Range Status Comment   Specimen Description WOUND NECK CERVICAL FOUR FIVE DISC SPACE   Final    Special Requests NONE   Final    ACID FAST SMEAR NO ACID FAST BACILLI SEEN   Final    Culture     Final    Value: CULTURE WILL BE EXAMINED FOR 6 WEEKS BEFORE ISSUING A FINAL REPORT   Report Status PENDING   Incomplete   GRAM STAIN     Status: Normal   Collection Time   10/19/11  8:29 PM      Component Value Range Status Comment   Specimen Description WOUND NECK CERVICAL FOUR FIVE DISC SPACE   Final    Special Requests NONE   Final    Gram Stain     Final    Value: RARE WBC PRESENT, PREDOMINANTLY MONONUCLEAR     NO ORGANISMS SEEN   Report Status 10/19/2011 FINAL   Final   ANAEROBIC CULTURE     Status: Normal   Collection Time   10/19/11  8:33 PM      Component Value Range Status Comment   Specimen Description NECK CERVICAL FOUR FIVE DISC SPACE   Final    Special Requests NONE   Final    Gram Stain     Final    Value: RARE WBC PRESENT, PREDOMINANTLY  MONONUCLEAR     NO ORGANISMS SEEN     Performed at [facility]   Culture NO ANAEROBES ISOLATED   Final    Report Status 10/25/2011 FINAL   Final   TISSUE CULTURE     Status: Normal   Collection Time   10/19/11  8:34 PM      Component Value Range Status Comment   Specimen Description TISSUE NECK CERVICAL FOUR FIVE DISC    Final    Special Requests NONE   Final    Gram Stain     Final    Value: NO WBC SEEN     NO ORGANISMS SEEN     Performed at [facility]   Culture     Final    Value: MODERATE STAPHYLOCOCCUS AUREUS     Note: RIFAMPIN AND GENTAMICIN SHOULD NOT BE USED AS SINGLE DRUGS FOR TREATMENT OF STAPH INFECTIONS.   Report Status 10/22/2011 FINAL   Final    Organism ID, Bacteria STAPHYLOCOCCUS AUREUS   Final   AFB CULTURE WITH SMEAR     Status: Normal (Preliminary result)   Collection Time   10/19/11  8:34 PM      Component Value Range Status Comment   Specimen Description TISSUE NECK CERVICAL FOUR FIVE DISC   Final    Special Requests NONE   Final    ACID FAST SMEAR NO ACID FAST BACILLI SEEN   Final    Culture     Final    Value: CULTURE WILL BE EXAMINED FOR 6 WEEKS BEFORE ISSUING A FINAL REPORT   Report Status PENDING   Incomplete   GRAM STAIN     Status: Normal   Collection Time   10/19/11  8:34 PM      Component Value Range Status Comment   Specimen Description TISSUE NECK CERVICAL FOUR FIVE DISC   Final    Special Requests NONE   Final    Gram Stain     Final    Value: NO WBC SEEN     NO ORGANISMS SEEN   Report Status 10/19/2011 FINAL   Final   ANAEROBIC CULTURE     Status: Normal   Collection Time   10/19/11  8:37 PM      Component Value Range Status Comment   Specimen Description TISSUE CERIVAL FOUR FIVE DISC   Final    Special Requests NONE   Final    Gram Stain     Final    Value: NO WBC SEEN     NO ORGANISMS SEEN     Performed at [facility]   Culture NO ANAEROBES ISOLATED   Final    Report Status 10/25/2011 FINAL   Final   WOUND CULTURE     Status: Normal   Collection Time   10/19/11 10:14 PM      Component Value Range Status Comment   Specimen Description WOUND NECK CERVICAL FOUR FIVE EPIDURAL DISC SPACE   Final    Special Requests NONE   Final    Gram Stain     Final    Value: FEW WBC PRESENT,BOTH PMN AND MONONUCLEAR     RARE GRAM POSITIVE COCCI IN PAIRS      Performed at [facility]   Culture     Final    Value: MODERATE STAPHYLOCOCCUS AUREUS     Note: RIFAMPIN AND GENTAMICIN SHOULD NOT BE USED AS SINGLE DRUGS FOR TREATMENT OF STAPH INFECTIONS.   Report Status  10/22/2011 FINAL   Final    Organism ID, Bacteria STAPHYLOCOCCUS AUREUS   Final   ANAEROBIC CULTURE     Status: Normal   Collection Time   10/19/11 10:14 PM      Component Value Range Status Comment   Specimen Description NECK CERVICAL FOUR FIVE EPIDURAL DISC SPACE   Final    Special Requests NONE   Final    Gram Stain     Final    Value: FEW WBC PRESENT,BOTH PMN AND MONONUCLEAR     RARE GRAM POSITIVE COCCI IN PAIRS     Performed at [facility]   Culture NO ANAEROBES ISOLATED   Final    Report Status 10/25/2011 FINAL   Final   GRAM STAIN     Status: Normal   Collection Time   10/19/11 10:14 PM      Component Value Range Status Comment   Specimen Description NECK CERVICAL FOUR FIVE WPIDURAL DISC SPACE   Final    Special Requests NONE   Final    Gram Stain     Final    Value: FEW WBC PRESENT,BOTH PMN AND MONONUCLEAR     RARE GRAM POSITIVE COCCI IN PAIRS   Report Status 10/19/2011 FINAL   Final   AFB CULTURE WITH SMEAR     Status: Normal (Preliminary result)   Collection Time   10/19/11 10:14 PM      Component Value Range Status Comment   Specimen Description NECK CERVICAL FOUR FIVE WPIDURAL DISC SPACE   Final    Special Requests NONE   Final    ACID FAST SMEAR NO ACID FAST BACILLI SEEN   Final    Culture     Final    Value: CULTURE WILL BE EXAMINED FOR 6 WEEKS BEFORE ISSUING A FINAL REPORT   Report Status PENDING   Incomplete   CULTURE, BLOOD (ROUTINE X 2)     Status: Normal (Preliminary result)   Collection Time   10/20/11  4:40 PM      Component Value Range Status Comment   Specimen Description BLOOD LEFT HAND   Final    Special Requests BOTTLES DRAWN AEROBIC AND ANAEROBIC 5CC   Final    Culture  Setup Time [id number]   Final    Culture     Final    Value:         BLOOD CULTURE RECEIVED NO GROWTH TO DATE CULTURE WILL BE HELD FOR 5 DAYS BEFORE ISSUING A FINAL NEGATIVE REPORT   Report Status PENDING   Incomplete   MRSA PCR SCREENING     Status: Normal   Collection Time   10/24/11  2:42 PM      Component Value Range Status Comment   MRSA by PCR NEGATIVE  NEGATIVE  Final     Anti-infectives     Start     Dose/Rate Route Frequency Ordered Stop   10/25/11 1200   ceFAZolin (ANCEF) IVPB 1 g/50 mL premix        1 g 100 mL/hr over 30 Minutes Intravenous 3 times per day 10/25/11 1040     10/24/11 2200   ceFAZolin (ANCEF) IVPB 1 g/50 mL premix  Status:  Discontinued        1 g 100 mL/hr over 30 Minutes Intravenous Every 12 hours 10/24/11 1749 10/25/11 1040   10/23/11 2200   ceFAZolin (ANCEF) IVPB 1 g/50 mL premix  Status:  Discontinued        1 g  100 mL/hr over 30 Minutes Intravenous Every 12 hours 10/23/11 1739 10/24/11 1719   10/21/11 1400   ceFAZolin (ANCEF) IVPB 1 g/50 mL premix  Status:  Discontinued        1 g 100 mL/hr over 30 Minutes Intravenous 3 times per day 10/21/11 1202 10/23/11 1739   10/19/11 2007   bacitracin 21308 UNITS injection     Comments: [name], [name]: cabinet override         10/19/11 2007 10/20/11 0814   10/19/11 2005   bacitracin 50,000 Units in sodium chloride irrigation 0.9 % 500 mL irrigation  Status:  Discontinued          As needed 10/19/11 2058 10/19/11 2209   10/19/11 1200   vancomycin (VANCOCIN) 1,250 mg in sodium chloride 0.9 % 250 mL IVPB  Status:  Discontinued        1,250 mg 166.7 mL/hr over 90 Minutes Intravenous Every 12 hours 10/18/11 2157 10/21/11 1202   10/19/11 0200   acyclovir (ZOVIRAX) 930 mg in dextrose 5 % 150 mL IVPB  Status:  Discontinued        10 mg/kg  92.8 kg (Adjusted) 168.6 mL/hr over 60 Minutes Intravenous 3 times per day 10/19/11 0156 10/20/11 1607   10/18/11 2230   ampicillin (OMNIPEN) 2 g in sodium chloride 0.9 % 50 mL IVPB  Status:  Discontinued        2 g 150 mL/hr over 20  Minutes Intravenous Every 4 hours 10/18/11 2130 10/20/11 1607   10/18/11 2200   cefTRIAXone (ROCEPHIN) 2 g in dextrose 5 % 50 mL IVPB  Status:  Discontinued        2 g 100 mL/hr over 30 Minutes Intravenous Every 12 hours 10/18/11 2130 10/20/11 1607   10/18/11 2200   vancomycin (VANCOCIN) IVPB 1000 mg/200 mL premix        1,000 mg 200 mL/hr over 60 Minutes Intravenous  Once 10/18/11 2156 10/19/11 0258   10/18/11 2045   piperacillin-tazobactam (ZOSYN) IVPB 3.375 g  Status:  Discontinued        3.375 g 100 mL/hr over 30 Minutes Intravenous  Once 10/18/11 2029 10/18/11 2204   10/18/11 2045   vancomycin (VANCOCIN) IVPB 1000 mg/200 mL premix        1,000 mg 200 mL/hr over 60 Minutes Intravenous  Once 10/18/11 2029 10/18/11 2235         Assessment:  60 yom with MSSA bacteremia who is currently receiving Ancef 1gm IV Q8h. Noted pt had worsening of renal function which is now improving. UOP 1.1 ml/kg/24h.  Lab 10/26/11 0610 10/25/11 0405 10/24/11 2230 10/24/11 1100 10/23/11 0635  CREATININE 0.73 1.69* 2.24* 5.96* 3.39*   Pt is afebrile and wbc 17.2. Recent blood culture NGTD. Noted TEE was unable to be conducted and pt is to go for another. Will dose ancef on the aggressive side until endocarditis can be ruled out.   Goal of Therapy:  Clearance of bacteremia, renal adj abx  Plan:  Increase ancef to 2g q8h F/u TEE  Continue to f/u renal function  Thank you,  Brett Fairy, PharmD Pager: [phone]  10/26/2011 8:33 AM

## 2011-10-26 NOTE — Consult Note (Signed)
WOC consult Note Reason for Consult: Full-thickness skin tear on upper abdomen Wound type:skin tear Pressure Ulcer POA: No Measurement:7cm x 17 cm x .2cm with unroofed area measuring 5 cm x 5 cm x .2cm in center Wound ZOX:WRUEA, pink, moist.  75% closed, 25% unroofed blister Drainage (amount, consistency, odor) small amount of serosanguinous exudate on old dressing Periwound:intact, clear Dressing procedure/placement/frequency: Soft silicone foam dressing with twice weekly changes (and PRN) until healed. I will not follow.  Please re-consult if needed. Thanks, Ladona Mow, MSN, RN, [facility], CWOCN [phone])

## 2011-10-26 NOTE — Progress Notes (Signed)
Speech Language Pathology Dysphagia Treatment Patient Details Name: Charles Ball MRN: [id number] DOB: 18-Apr-1951 Today's Date: 10/26/2011 Time: 7829-5621 SLP Time Calculation (min): 15 min  Assessment / Plan / Recommendation Clinical Impression  Pt reports stomach discomfort, so refusing solids at this time.  No overt s/s aspiration observed or reported. Reviewed MBS results with pt/family members, indicating primary concern was increased risk with rapid fatigue.    Diet Recommendation  Continue with Current Diet: Dysphagia 2 (fine chop);Thin liquid    SLP Plan Continue with current plan of care   Pertinent Vitals/Pain 10/10 neck, left shoulder and stomach.  Nursing notified.   Swallowing Goals  SLP Swallowing Goals Swallow Study Goal #1 - Progress: Progressing toward goal Swallow Study Goal #2 - Progress: Progressing toward goal  General  Pt awake, alert. Refusing solids due to stomach issues.  Oral Cavity - Oral Hygiene  [facility]  Dysphagia Treatment Treatment focused on: Skilled observation of diet tolerance;Patient/family/caregiver education Family/Caregiver Educated: Daughters Treatment Methods/Modalities: Skilled observation Patient observed directly with PO's: Yes Type of PO's observed: Thin liquids Feeding: Needs assist Liquids provided via: Straw Type of cueing: Verbal Amount of cueing: Minimal  Charles Ball B. [place], [facility], CCC-SLP [phone]  Charles Ball 10/26/2011, 1:45 PM

## 2011-10-26 NOTE — Plan of Care (Signed)
Problem: Discharge Progression Outcomes Goal: Tolerating diet Outcome: Progressing Natanel Snavely B. Arlin Savona, MSP, CCC-SLP [phone]        

## 2011-10-26 NOTE — Progress Notes (Signed)
Subjective: Patient not feeling well. He complains of continued chronic neck pain. He states to when necessary pain medication he gets works and completely relieves his pain, but then it starts to recur until he is able to get his next when necessary pain dose. He also complains of chronic neuropathy in his lower extremities of which she's not ever had any treatment. States his breathing is fair. No chest pain.  Objective: Weight change: -6.632 kg (-14 lb 9.9 oz)  Intake/Output Summary (Last 24 hours) at 10/26/11 1946 Last data filed at 10/26/11 1900  Gross per 24 hour  Intake   1640 ml  Output   3675 ml  Net  -2035 ml    Filed Vitals:   10/26/11 1737  BP: 116/67  Pulse: 79  Temp: 97.7 F (36.5 C)  Resp: 18   General: Alert and oriented x3, mild distress secondary to neck pain, looks older than stated age, fatigued HEENT: Normocephalic . Evidence of cervical scar from previous surgery, mucous membranes are slightly dry Cardiovascular: Regular rate and rhythm, S1-S2 Lungs: Decreased breath sounds throughout Abdomen: Soft, obese, nontender, positive bowel sounds Extremity: No clubbing or cyanosis, trace pitting edema from the bilateral knees down.  Lab Results: Basic Metabolic Panel:  Basename 10/26/11 0610 10/25/11 0405  NA 138 135  K 4.9 4.4  CL 100 98  CO2 29 22  GLUCOSE 145* 133*  BUN 34* 59*  CREATININE 0.73 1.69*  CALCIUM 8.4 8.8  MG 2.5 2.6*  PHOS 2.9 3.9   Liver Function Tests:  Basename 10/24/11 1100  AST 18  ALT <5  ALKPHOS 50  BILITOT 0.3  PROT 5.6*  ALBUMIN 1.9*   CBC:  Basename 10/26/11 0610 10/25/11 0405  WBC 17.2* 17.6*  NEUTROABS -- --  HGB 13.4 12.8*  HCT 39.2 37.3*  MCV 87.1 85.6  PLT 268 235   BNP:  Basename 10/24/11 0640  PROBNP 226.7*   D-Dimer: No results found for this basename: DDIMER:2 in the last 72 hours CBG:  Basename 10/26/11 1640 10/26/11 1153 10/26/11 0746 10/26/11 0408 10/26/11 0043 10/25/11 2000  GLUCAP 113* 113*  145* 128* 133* 125*   US Renal Port 10/24/2011   IMPRESSION: No evidence of hydronephrosis.  Please note the left kidney is poorly visualized on this portable examination.  Dg Chest Port 1 View 10/25/2011 .  IMPRESSION: Slightly improved aeration in the left lung with prominent interstitial markings.  Findings could represent mild edema.  Persistent densities at the left costophrenic angle are indeterminate. Medications: Scheduled Meds:   . antiseptic oral rinse  15 mL Mouth Rinse QID  .  ceFAZolin (ANCEF) IV  2 g Intravenous Q8H  . chlorhexidine  15 mL Mouth Rinse BID  . feeding supplement  237 mL Oral BID BM  . gabapentin  300 mg Oral Daily  . hydrochlorothiazide  12.5 mg Oral Daily  . insulin aspart  0-15 Units Subcutaneous Q4H  . insulin aspart  0-5 Units Subcutaneous QHS  . insulin aspart  4 Units Subcutaneous TID WC  . insulin glargine  5 Units Subcutaneous QHS  . oxyCODONE  20 mg Oral Q12H  . polyethylene glycol  17 g Oral BID  . senna-docusate  2 tablet Oral BID  . DISCONTD:  ceFAZolin (ANCEF) IV  1 g Intravenous Q8H   Continuous Infusions:  PRN Meds:.acetaminophen, acetaminophen, ALPRAZolam, fentaNYL, Glycerin (Adult), HYDROmorphone (DILAUDID) injection, menthol-cetylpyridinium, ondansetron (ZOFRAN) IV, oxyCODONE, phenol, DISCONTD: oxyCODONE  Assessment/Plan: Patient Active Hospital Problem List: Hypertension: Stable.  Acute  respiratory failure (10/19/2011) More for volume overload which has since improved. Check followup BNP.  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 Long-term plan patient will need inpatient rehabilitation.in regards to his pain management, has added OxyContin extended release every 12 hours which also will help his symptoms.  Thrombocytopenia (10/21/2011) Stable. Has since resolved.  Hyperglycemia (10/21/2011) Stable. Sugars have been fine  here.  Bacteremia due to Staphylococcus (10/21/2011) Continue on Ancef. Repeat blood cultures will follow. Patient will need TEE. White blood cell count still elevated. Recheck in the morning.  Peripheral neuropathy (10/26/2011) Have started Neurontin.  Acute renal failure: Almost fully resolved secondary to obstructive uropathy. Recheck labs the morning and will have urology followup. In the meantime continue Foley catheter.   LOS: 8 days   Charles Ball K 10/26/2011, 7:46 PM

## 2011-10-27 LAB — BASIC METABOLIC PANEL
BUN: 27 mg/dL — ABNORMAL HIGH (ref 6–23)
CO2: 27 mEq/L (ref 19–32)
Calcium: 8.3 mg/dL — ABNORMAL LOW (ref 8.4–10.5)
Creatinine, Ser: 0.68 mg/dL (ref 0.50–1.35)
Glucose, Bld: 105 mg/dL — ABNORMAL HIGH (ref 70–99)

## 2011-10-27 LAB — GLUCOSE, CAPILLARY
Glucose-Capillary: 94 mg/dL (ref 70–99)
Glucose-Capillary: 103 mg/dL — ABNORMAL HIGH (ref 70–99)

## 2011-10-27 LAB — CBC
HCT: 38.5 % — ABNORMAL LOW (ref 39.0–52.0)
MCH: 29.2 pg (ref 26.0–34.0)
MCHC: 32.7 g/dL (ref 30.0–36.0)
MCV: 89.1 fL (ref 78.0–100.0)
Platelets: 241 10*3/uL (ref 150–400)
RDW: 13.6 % (ref 11.5–15.5)
WBC: 16.7 10*3/uL — ABNORMAL HIGH (ref 4.0–10.5)

## 2011-10-27 LAB — CULTURE, BLOOD (ROUTINE X 2)
Culture  Setup Time: 201306040139
Culture: NO GROWTH

## 2011-10-27 MED ORDER — OXYCODONE HCL 15 MG PO TB12
30.0000 mg | ORAL_TABLET | Freq: Two times a day (BID) | ORAL | Status: DC
  Administered 2011-10-27 – 2011-10-29 (×4): 30 mg via ORAL
  Filled 2011-10-27 (×4): qty 2

## 2011-10-27 MED ORDER — FENTANYL CITRATE 0.05 MG/ML IJ SOLN
25.0000 ug | INTRAMUSCULAR | Status: DC | PRN
  Administered 2011-10-27 – 2011-10-31 (×32): 25 ug via INTRAVENOUS
  Filled 2011-10-27 (×32): qty 2

## 2011-10-27 MED ORDER — HYDROMORPHONE HCL PF 1 MG/ML IJ SOLN
0.5000 mg | INTRAMUSCULAR | Status: DC | PRN
  Administered 2011-10-31 (×2): 0.5 mg via INTRAVENOUS
  Filled 2011-10-27 (×2): qty 1

## 2011-10-27 MED ORDER — GABAPENTIN 300 MG PO CAPS
300.0000 mg | ORAL_CAPSULE | Freq: Two times a day (BID) | ORAL | Status: DC
  Administered 2011-10-27 – 2011-10-28 (×3): 300 mg via ORAL
  Filled 2011-10-27 (×3): qty 1

## 2011-10-27 MED ORDER — GABAPENTIN 300 MG PO CAPS
300.0000 mg | ORAL_CAPSULE | Freq: Every day | ORAL | Status: DC
  Administered 2011-10-27: 300 mg via ORAL
  Filled 2011-10-27: qty 1

## 2011-10-27 NOTE — Progress Notes (Signed)
Subjective: Patient feeling better today. Some improvement with starting extended release OxyContin. Decreased neck pain. Still some neuropathy. Breathing easier.  Objective: Weight change: -0.771 kg (-1 lb 11.2 oz)  Intake/Output Summary (Last 24 hours) at 10/27/11 1515 Last data filed at 10/27/11 1359  Gross per 24 hour  Intake   1220 ml  Output   2500 ml  Net  -1280 ml    Filed Vitals:   10/27/11 1346  BP: 141/77  Pulse: 79  Temp: 98.6 F (37 C)  Resp: 18   General: Alert and oriented x3, mild distress secondary to neck pain, looks older than stated age, fatigued HEENT: Normocephalic . Evidence of cervical scar from previous surgery, mucous membranes are slightly dry Cardiovascular: Regular rate and rhythm, S1-S2 Lungs: Decreased breath sounds throughout Abdomen: Soft, obese, nontender, positive bowel sounds Extremity: No clubbing or cyanosis, trace pitting edema from the bilateral knees down.  Lab Results: Basic Metabolic Panel:  Basename 10/27/11 0637 10/26/11 0610 10/25/11 0405  NA 134* 138 --  K 4.2 4.9 --  CL 97 100 --  CO2 27 29 --  GLUCOSE 105* 145* --  BUN 27* 34* --  CREATININE 0.68 0.73 --  CALCIUM 8.3* 8.4 --  MG -- 2.5 2.6*  PHOS -- 2.9 3.9   CBC:  Basename 10/27/11 0637 10/26/11 0610  WBC 16.7* 17.2*  NEUTROABS -- --  HGB 12.6* 13.4  HCT 38.5* 39.2  MCV 89.1 87.1  PLT 241 268   BNP:  Basename 10/27/11 0637  PROBNP 39.6   CBG:  Basename 10/27/11 1121 10/27/11 0727 10/26/11 2125 10/26/11 1640 10/26/11 1153 10/26/11 0746  GLUCAP 103* 94 97 113* 113* 145*   US Renal Port 10/24/2011   IMPRESSION: No evidence of hydronephrosis.  Please note the left kidney is poorly visualized on this portable examination.  Dg Chest Port 1 View 10/25/2011 .  IMPRESSION: Slightly improved aeration in the left lung with prominent interstitial markings.  Findings could represent mild edema.  Persistent densities at the left costophrenic angle are  indeterminate. Medications: Scheduled Meds:    . antiseptic oral rinse  15 mL Mouth Rinse QID  .  ceFAZolin (ANCEF) IV  2 g Intravenous Q8H  . chlorhexidine  15 mL Mouth Rinse BID  . feeding supplement  237 mL Oral BID BM  . gabapentin  300 mg Oral Daily  . hydrochlorothiazide  12.5 mg Oral Daily  . insulin aspart  0-15 Units Subcutaneous TID AC  . insulin aspart  0-5 Units Subcutaneous QHS  . insulin aspart  4 Units Subcutaneous TID WC  . insulin glargine  5 Units Subcutaneous QHS  . oxyCODONE  30 mg Oral Q12H  . polyethylene glycol  17 g Oral BID  . senna-docusate  2 tablet Oral BID  . DISCONTD: gabapentin  300 mg Oral Daily  . DISCONTD: insulin aspart  0-15 Units Subcutaneous Q4H  . DISCONTD: oxyCODONE  20 mg Oral Q12H   Continuous Infusions:  PRN Meds:.acetaminophen, acetaminophen, ALPRAZolam, fentaNYL, Glycerin (Adult), HYDROmorphone (DILAUDID) injection, menthol-cetylpyridinium, ondansetron (ZOFRAN) IV, oxyCODONE, phenol, DISCONTD: fentaNYL, DISCONTD:  HYDROmorphone (DILAUDID) injection  Assessment/Plan: Patient Active Hospital Problem List: Hypertension: Stable.  Acute respiratory failure (10/19/2011) More for volume overload which has since improved. 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 Long-term plan patient will need inpatient rehabilitation.in regards to his pain management, has added OxyContin extended release every 12 hours  which also will help his symptoms.we'll work on titrating this up and decreasing his when necessary pain medications  Thrombocytopenia (10/21/2011) Stable. Has since resolved.  Hyperglycemia (10/21/2011) Stable. Sugars have been fine here.  Bacteremia due to Staphylococcus (10/21/2011) Continue on Ancef. Repeat blood cultures will follow. Patient will need TEE. White blood cell count still elevated, but  slowly decreasing. I have spoken with cardiology who recommends that patient needs a TEE and the best way to get this would be with anesthesia. We'll coordinate.  Peripheral neuropathy (10/26/2011) Have started Neurontin and are titrating up appropriately.  Acute renal failure: Almost fully resolved secondary to obstructive uropathy. Recheck labs the morning and will have urology followup. In the meantime continue Foley catheter.   LOS: 9 days   Blaize Nipper K 10/27/2011, 3:15 PM

## 2011-10-27 NOTE — Clinical Social Work Placement (Addendum)
    Clinical Social Work Department CLINICAL SOCIAL WORK PLACEMENT NOTE 10/27/2011  Patient:  FORRESTER, BLANDO  Account Number:  [id number] Admit date:  10/18/2011  Clinical Social Worker:  Genelle Bal, LCSW  Date/time:  10/27/2011 02:19 AM  Clinical Social Work is seeking post-discharge placement for this patient at the following level of care:   SKILLED NURSING   (*CSW will update this form in Epic as items are completed)   10/24/2011  Patient/family provided with [facility] Department of Clinical Social Work's list of facilities offering this level of care within the geographic area requested by the patient (or if unable, by the patient's family).  10/24/2011  Patient/family informed of their freedom to choose among providers that offer the needed level of care, that participate in Medicare, Medicaid or managed care program needed by the patient, have an available bed and are willing to accept the patient.    Patient/family informed of [facility]' ownership interest in [facility], as well as of the fact that they are under no obligation to receive care at this facility.  PASARR submitted to EDS on 10/27/2011 PASARR number received from EDS on 10/27/11  FL2 transmitted to all facilities in geographic area requested by pt/family on  10/27/2011 FL2 transmitted to all facilities within larger geographic area on 10/27/11 [facility]  Patient informed that his/her managed care company has contracts with or will negotiate with  certain facilities, including the following:     Patient/family informed of bed offers received: 10/27/11 for [facility]; 6/11-[facility]    Patient chooses bed at [facility] Physician recommends and patient chooses bed at    Patient to be transferred to [place] on 10/31/11  Patient to be transferred to facility by ambulance   The following physician request were entered in Epic:   Additional Comments: 10/28/11  - As of this note patient has no bed offers in [facility]. CSW contacted Ms. Rod Mae and advised her of no bed offers in [place] and that [place] and [place] are the bed offers for [facility]. Girlfriend will talk with patient and get back with CSW before 5 pm today.

## 2011-10-27 NOTE — Progress Notes (Signed)
INFECTIOUS DISEASE PROGRESS NOTE  ID: Charles Ball is a 61 y.o. male found down on 10/18/11 2/2 AMS found to have MSSA C4-C5 epidural abscess and bacteremia c/b ARF, now resolved. Currently on cefazolin.  Subjective: Afbrile, feeling better, eating without difficulty. Complains of left knee pain, stiff. Awaiting to get TEE per wife's report. No problems with swallowing  24hr event: wbc continues to trend down 20 on 6/6, now 16.7 on 6/10  Abtx:  Cefazolin 2gm VI Q 8hr  Medications:     . antiseptic oral rinse  15 mL Mouth Rinse QID  .  ceFAZolin (ANCEF) IV  2 g Intravenous Q8H  . chlorhexidine  15 mL Mouth Rinse BID  . feeding supplement  237 mL Oral BID BM  . gabapentin  300 mg Oral Daily  . hydrochlorothiazide  12.5 mg Oral Daily  . insulin aspart  0-15 Units Subcutaneous TID AC  . insulin aspart  0-5 Units Subcutaneous QHS  . insulin aspart  4 Units Subcutaneous TID WC  . insulin glargine  5 Units Subcutaneous QHS  . oxyCODONE  30 mg Oral Q12H  . polyethylene glycol  17 g Oral BID  . senna-docusate  2 tablet Oral BID  . DISCONTD: gabapentin  300 mg Oral Daily  . DISCONTD: insulin aspart  0-15 Units Subcutaneous Q4H  . DISCONTD: oxyCODONE  20 mg Oral Q12H    Objective: Vital signs in last 24 hours: Temp:  [97.7 F (36.5 C)-98.8 F (37.1 C)] 98 F (36.7 C) (06/10 1000) Pulse Rate:  [71-79] 72  (06/10 1000) Resp:  [18-20] 20  (06/10 1000) BP: (116-131)/(67-78) 131/78 mmHg (06/10 1000) SpO2:  [96 %-100 %] 98 % (06/10 1000) Weight:  [242 lb 8 oz (109.997 kg)] 242 lb 8 oz (109.997 kg) (06/09 2130)  Gen= a xo by 3 in NAD HEENT= [place]/AT, perrla, eomi, no scleral icterus, op clear Neck= sutures at right side of neck c/d/i, no lad Pulm= ctab no w/c/r in ant lung fields Cors= nl s1,s2, but distant heart sounds to ascultate murmur Abd= mildly distended, soft, nontender, non distended Ext= trace edema LE bilaterally, knees non tender, no effusion, no erythema, no  warmth   Lab Results  [facility] 10/27/11 0637 10/26/11 0610  WBC 16.7* 17.2*  HGB 12.6* 13.4  HCT 38.5* 39.2  NA 134* 138  K 4.2 4.9  CL 97 100  CO2 27 29  BUN 27* 34*  CREATININE 0.68 0.73  GLU -- --    Microbiology: Recent Results (from the past 240 hour(s))  CULTURE, BLOOD (ROUTINE X 2)     Status: Normal   Collection Time   10/18/11  6:55 PM      Component Value Range Status Comment   Specimen Description BLOOD RIGHT ARM  5 ML IN [facility] BOTTLE   Final    Special Requests NONE   Final    Culture  Setup Time [id number]   Final    Culture     Final    Value: STAPHYLOCOCCUS AUREUS     Note: RIFAMPIN AND GENTAMICIN SHOULD NOT BE USED AS SINGLE DRUGS FOR TREATMENT OF STAPH INFECTIONS.     Note: Gram Stain Report Called to,Read Back By and Verified With: RN S. DILLON ON 10/19/11 AT 1500 BY DTERRY   Report Status 10/21/2011 FINAL   Final    Organism ID, Bacteria STAPHYLOCOCCUS AUREUS   Final   URINE CULTURE     Status: Normal   Collection Time   10/18/11  8:14 PM      Component Value Range Status Comment   Specimen Description URINE, CATHETERIZED   Final    Special Requests NONE   Final    Culture  Setup Time [id number]   Final    Colony Count NO GROWTH   Final    Culture NO GROWTH   Final    Report Status 10/20/2011 FINAL   Final   CULTURE, BLOOD (ROUTINE X 2)     Status: Normal   Collection Time   10/18/11  9:15 PM      Component Value Range Status Comment   Specimen Description BLOOD LEFT HAND   Final    Special Requests BOTTLES DRAWN AEROBIC AND ANAEROBIC 4CC   Final    Culture  Setup Time [id number]   Final    Culture     Final    Value: STAPHYLOCOCCUS AUREUS     Note: SUSCEPTIBILITIES PERFORMED ON PREVIOUS CULTURE WITHIN THE LAST 5 DAYS.     Note: Gram Stain Report Called to,Read Back By and Verified With: RN S. DILLON ON 10/19/11 AT 1840 BY DTERRY   Report Status 10/21/2011 FINAL   Final   CSF CULTURE     Status: Normal   Collection Time   10/18/11 11:45 PM       Component Value Range Status Comment   Specimen Description CSF   Final    Special Requests Normal   Final    Gram Stain     Final    Value: CYTOSPIN SLIDE WBC PRESENT,BOTH PMN AND MONONUCLEAR     NO ORGANISMS SEEN     Gram Stain Report Called to,Read Back By and Verified With: Gram Stain Report Called to,Read Back By and Verified With: TWOODS RN AT 0141 ON [id number] BY DLONG Performed by [facility]   Culture NO GROWTH 3 DAYS   Final    Report Status 10/22/2011 FINAL   Final   GRAM STAIN     Status: Normal   Collection Time   10/18/11 11:45 PM      Component Value Range Status Comment   Specimen Description CSF   Final    Special Requests Normal   Final    Gram Stain     Final    Value: WBC PRESENT,BOTH PMN AND MONONUCLEAR     NO ORGANISMS SEEN     CYTO SPUN SLIDE     Gram Stain Report Called to,Read Back By and Verified With: TWOODS RN AT 0141 ON [id number] BY DLONG   Report Status 10/19/2011 FINAL   Final   MRSA PCR SCREENING     Status: Normal   Collection Time   10/19/11 12:23 AM      Component Value Range Status Comment   MRSA by PCR NEGATIVE  NEGATIVE  Final   WOUND CULTURE     Status: Normal   Collection Time   10/19/11  8:29 PM      Component Value Range Status Comment   Specimen Description WOUND NECK CERVICAL FOUR FIVE DISC SPACE   Final    Special Requests NONE   Final    Gram Stain     Final    Value: RARE WBC PRESENT, PREDOMINANTLY MONONUCLEAR     NO ORGANISMS SEEN     Performed at [facility]   Culture     Final    Value: MODERATE STAPHYLOCOCCUS AUREUS     Note: SUSCEPTIBILITIES PERFORMED ON PREVIOUS CULTURE WITHIN THE LAST 5  DAYS.   Report Status 10/22/2011 FINAL   Final   AFB CULTURE WITH SMEAR     Status: Normal (Preliminary result)   Collection Time   10/19/11  8:29 PM      Component Value Range Status Comment   Specimen Description WOUND NECK CERVICAL FOUR FIVE DISC SPACE   Final    Special Requests NONE   Final    ACID FAST SMEAR NO ACID FAST  BACILLI SEEN   Final    Culture     Final    Value: CULTURE WILL BE EXAMINED FOR 6 WEEKS BEFORE ISSUING A FINAL REPORT   Report Status PENDING   Incomplete   GRAM STAIN     Status: Normal   Collection Time   10/19/11  8:29 PM      Component Value Range Status Comment   Specimen Description WOUND NECK CERVICAL FOUR FIVE DISC SPACE   Final    Special Requests NONE   Final    Gram Stain     Final    Value: RARE WBC PRESENT, PREDOMINANTLY MONONUCLEAR     NO ORGANISMS SEEN   Report Status 10/19/2011 FINAL   Final   ANAEROBIC CULTURE     Status: Normal   Collection Time   10/19/11  8:33 PM      Component Value Range Status Comment   Specimen Description NECK CERVICAL FOUR FIVE DISC SPACE   Final    Special Requests NONE   Final    Gram Stain     Final    Value: RARE WBC PRESENT, PREDOMINANTLY MONONUCLEAR     NO ORGANISMS SEEN     Performed at [facility]   Culture NO ANAEROBES ISOLATED   Final    Report Status 10/25/2011 FINAL   Final   TISSUE CULTURE     Status: Normal   Collection Time   10/19/11  8:34 PM      Component Value Range Status Comment   Specimen Description TISSUE NECK CERVICAL FOUR FIVE DISC   Final    Special Requests NONE   Final    Gram Stain     Final    Value: NO WBC SEEN     NO ORGANISMS SEEN     Performed at [facility]   Culture     Final    Value: MODERATE STAPHYLOCOCCUS AUREUS     Note: RIFAMPIN AND GENTAMICIN SHOULD NOT BE USED AS SINGLE DRUGS FOR TREATMENT OF STAPH INFECTIONS.   Report Status 10/22/2011 FINAL   Final    Organism ID, Bacteria STAPHYLOCOCCUS AUREUS   Final   AFB CULTURE WITH SMEAR     Status: Normal (Preliminary result)   Collection Time   10/19/11  8:34 PM      Component Value Range Status Comment   Specimen Description TISSUE NECK CERVICAL FOUR FIVE DISC   Final    Special Requests NONE   Final    ACID FAST SMEAR NO ACID FAST BACILLI SEEN   Final    Culture     Final    Value: CULTURE WILL BE EXAMINED FOR 6 WEEKS BEFORE  ISSUING A FINAL REPORT   Report Status PENDING   Incomplete   GRAM STAIN     Status: Normal   Collection Time   10/19/11  8:34 PM      Component Value Range Status Comment   Specimen Description TISSUE NECK CERVICAL FOUR FIVE DISC   Final    Special  Requests NONE   Final    Gram Stain     Final    Value: NO WBC SEEN     NO ORGANISMS SEEN   Report Status 10/19/2011 FINAL   Final   ANAEROBIC CULTURE     Status: Normal   Collection Time   10/19/11  8:37 PM      Component Value Range Status Comment   Specimen Description TISSUE CERIVAL FOUR FIVE DISC   Final    Special Requests NONE   Final    Gram Stain     Final    Value: NO WBC SEEN     NO ORGANISMS SEEN     Performed at [facility]   Culture NO ANAEROBES ISOLATED   Final    Report Status 10/25/2011 FINAL   Final   WOUND CULTURE     Status: Normal   Collection Time   10/19/11 10:14 PM      Component Value Range Status Comment   Specimen Description WOUND NECK CERVICAL FOUR FIVE EPIDURAL DISC SPACE   Final    Special Requests NONE   Final    Gram Stain     Final    Value: FEW WBC PRESENT,BOTH PMN AND MONONUCLEAR     RARE GRAM POSITIVE COCCI IN PAIRS     Performed at [facility]   Culture     Final    Value: MODERATE STAPHYLOCOCCUS AUREUS     Note: RIFAMPIN AND GENTAMICIN SHOULD NOT BE USED AS SINGLE DRUGS FOR TREATMENT OF STAPH INFECTIONS.   Report Status 10/22/2011 FINAL   Final    Organism ID, Bacteria STAPHYLOCOCCUS AUREUS   Final   ANAEROBIC CULTURE     Status: Normal   Collection Time   10/19/11 10:14 PM      Component Value Range Status Comment   Specimen Description NECK CERVICAL FOUR FIVE EPIDURAL DISC SPACE   Final    Special Requests NONE   Final    Gram Stain     Final    Value: FEW WBC PRESENT,BOTH PMN AND MONONUCLEAR     RARE GRAM POSITIVE COCCI IN PAIRS     Performed at [facility]   Culture NO ANAEROBES ISOLATED   Final    Report Status 10/25/2011 FINAL   Final   GRAM STAIN     Status:  Normal   Collection Time   10/19/11 10:14 PM      Component Value Range Status Comment   Specimen Description NECK CERVICAL FOUR FIVE WPIDURAL DISC SPACE   Final    Special Requests NONE   Final    Gram Stain     Final    Value: FEW WBC PRESENT,BOTH PMN AND MONONUCLEAR     RARE GRAM POSITIVE COCCI IN PAIRS   Report Status 10/19/2011 FINAL   Final   AFB CULTURE WITH SMEAR     Status: Normal (Preliminary result)   Collection Time   10/19/11 10:14 PM      Component Value Range Status Comment   Specimen Description NECK CERVICAL FOUR FIVE WPIDURAL DISC SPACE   Final    Special Requests NONE   Final    ACID FAST SMEAR NO ACID FAST BACILLI SEEN   Final    Culture     Final    Value: CULTURE WILL BE EXAMINED FOR 6 WEEKS BEFORE ISSUING A FINAL REPORT   Report Status PENDING   Incomplete   CULTURE, BLOOD (ROUTINE X 2)  Status: Normal   Collection Time   10/20/11  4:40 PM      Component Value Range Status Comment   Specimen Description BLOOD LEFT HAND   Final    Special Requests BOTTLES DRAWN AEROBIC AND ANAEROBIC 5CC   Final    Culture  Setup Time [id number]   Final    Culture NO GROWTH 5 DAYS   Final    Report Status 10/27/2011 FINAL   Final   MRSA PCR SCREENING     Status: Normal   Collection Time   10/24/11  2:42 PM      Component Value Range Status Comment   MRSA by PCR NEGATIVE  NEGATIVE  Final     Studies/Results: Dg Swallowing Func-no Report  10/25/2011  CLINICAL DATA: possible aspiration   FLUOROSCOPY FOR SWALLOWING FUNCTION STUDY:  Fluoroscopy was provided for swallowing function study, which was  administered by a speech pathologist.  Final results and recommendations  from this study are contained within the speech pathology report.       Assessment/Plan: MSSA bacteremia = ideally would like to get TEE to see if any large vegetations or valvular dysfunction. Patient had attempted TEE but failed due to esophageal issues thought to be due to needing more sedation. Length of  therapy for antibiotics will be 6- 8wks for treatment of epidural abscess. If 2nd attempt for TEE fails, would just get 2 D -echo.  If blood cultures from 6/10 are no growth, then can place PICC line. Will also start "abtx" day #1 as 6/10, and treat until July 26th  MSSA Epidural abscess= would treat for 6- 8 wks.   Dispo= will arrange for ID hospital follow up visit after discharge as well as end of therapy between 6-8 wks.  Will sign off, call if questions, [phone]  Drue Second, Alonnah Lampkins Infectious Diseases 10/27/2011, 12:07 PM

## 2011-10-28 LAB — GLUCOSE, CAPILLARY
Glucose-Capillary: 124 mg/dL — ABNORMAL HIGH (ref 70–99)
Glucose-Capillary: 85 mg/dL (ref 70–99)
Glucose-Capillary: 115 mg/dL — ABNORMAL HIGH (ref 70–99)

## 2011-10-28 MED ORDER — GABAPENTIN 300 MG PO CAPS
300.0000 mg | ORAL_CAPSULE | Freq: Three times a day (TID) | ORAL | Status: DC
  Administered 2011-10-29 – 2011-10-31 (×8): 300 mg via ORAL
  Filled 2011-10-28 (×9): qty 1

## 2011-10-28 NOTE — Progress Notes (Signed)
Interim summary: Patient is a 61 year old white male with past medical history of hypertension and peripheral neuropathy, who was found down at home and initially had fallen out of bed but felt like he was unable to get up. He initially was able to converse in the emergency room and denied loss of consciousness but then became progressively altered mental status and febrile. He is admitted to the pulmonary critical care service for altered mental status and critically ill multiorgan system failure.he was noted on admission to have a sodium of 123, normal renal function and a mild leukocytosis.  Workup was unremarkable it was suspected patient may have meningitis.infectious diseases consult at but then patient started having decreased movement in all 4 extremities and emergent MRI scan of the cervical spine was obtained. 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  Post procedure, patient had minimal movement of his left side and mild on his right. Blood cultures ended up growing out gram-positive cocci in pairs in both bottles which grew out MSSA.it was suspected patient made a possible discitis with epidural abscess which led to his spinal cord compression. patient at this point was doing little bit better and discharged out of the ICU.  At this point he had been on Ancef for several days and infectious disease wwas recommending getting a TEE. This was attempted on 6/6, but failed due to multiple attempts unable to pass the TEE into the esophagus by both cardiology and gastroenterology. In discussion with cardiology, they felt that the scope was unable to pass because of osteophytes. They recommended possible correlation with anesthesia for direct visualization and placement of TEE probe.  On 6/7,  patient was noted to have dramatic increase in his creatinine and minimal urine output. He also looked to be having respiratory decompensation issues and he was transferred back to the critical care service in the ICU. Patient through workup was found to have postobstructive uropathy and finally a Foley catheter was able to be placed. Patient immediately voided a large amount of urine and over the next few days has had dramatic urine output and rapid improvement to normal renal function. It was felt his respiratory issues were secondary to volume overloading because of inability to void.  Patient was then transferred back to the hospitalist service on 6/9. At this point now his renal function is normalized. See assessment and plan for summary of outstanding issues.  Subjective: Patient feeling better today. Some improvement with starting extended release OxyContin. Decreased neck pain. Still some neuropathy. Breathing easier.   Objective: Weight change: -0.197 kg (-7 oz)  Intake/Output Summary (Last 24 hours) at 10/28/11 2216 Last data filed at 10/28/11 1700  Gross per 24 hour  Intake    840 ml  Output   2200 ml  Net  -1360 ml    Filed Vitals:   10/28/11 2120  BP: 112/75  Pulse: 88  Temp: 100.7 F (38.2 C)  Resp: 18   General: Alert and oriented x3, mild distress secondary to neck pain, looks older than stated age, fatigued HEENT: Normocephalic . Evidence of cervical scar from previous surgery, mucous membranes are slightly dry Cardiovascular: Regular rate and rhythm, S1-S2 Lungs: Decreased breath sounds throughout Abdomen: Soft, obese, nontender, positive bowel sounds Extremity: No clubbing or cyanosis, trace pitting edema from the bilateral  knees down.  Lab Results: Basic Metabolic Panel:  Basename 10/27/11 0637 10/26/11 0610  NA 134* 138  K 4.2 4.9  CL 97 100  CO2 27 29  GLUCOSE 105* 145*  BUN 27* 34*  CREATININE 0.68 0.73  CALCIUM 8.3* 8.4  MG -- 2.5  PHOS -- 2.9    CBC:  Basename 10/27/11 0637 10/26/11 0610  WBC 16.7* 17.2*  NEUTROABS -- --  HGB 12.6* 13.4  HCT 38.5* 39.2  MCV 89.1 87.1  PLT 241 268   BNP:  Basename 10/27/11 0637  PROBNP 39.6   CBG:  Basename 10/28/11 2109 10/28/11 1624 10/28/11 1206 10/28/11 0723 10/27/11 2118 10/27/11 1633  GLUCAP 131* 115* 85 105* 105* 115*   US Renal Port 10/24/2011   IMPRESSION: No evidence of hydronephrosis.  Please note the left kidney is poorly visualized on this portable examination.  Dg Chest Port 1 View 10/25/2011 .  IMPRESSION: Slightly improved aeration in the left lung with prominent interstitial markings.  Findings could represent mild edema.  Persistent densities at the left costophrenic angle are indeterminate. Medications: Scheduled Meds:    . antiseptic oral rinse  15 mL Mouth Rinse QID  .  ceFAZolin (ANCEF) IV  2 g Intravenous Q8H  . chlorhexidine  15 mL Mouth Rinse BID  . gabapentin  300 mg Oral BID  . hydrochlorothiazide  12.5 mg Oral Daily  . insulin aspart  0-15 Units Subcutaneous TID AC  . insulin aspart  0-5 Units Subcutaneous QHS  . insulin aspart  4 Units Subcutaneous TID WC  . insulin glargine  5 Units Subcutaneous QHS  . oxyCODONE  30 mg Oral Q12H  . polyethylene glycol  17 g Oral BID  . senna-docusate  2 tablet Oral BID  . DISCONTD: feeding supplement  237 mL Oral BID BM   Continuous Infusions:  PRN Meds:.acetaminophen, acetaminophen, ALPRAZolam, fentaNYL, Glycerin (Adult), HYDROmorphone (DILAUDID) injection, menthol-cetylpyridinium, ondansetron (ZOFRAN) IV, oxyCODONE, phenol  Assessment/Plan: Stable medical issues: Hypertension: Stable.  Thrombocytopenia (10/21/2011) Stable. Has since resolved.  Hyperglycemia (10/21/2011) Stable. Sugars have been fine here.  Acute respiratory failure (10/19/2011) More for volume overload which has since improved. 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 Long-term plan patient will need inpatient rehabilitation.in regards to his pain management, has added OxyContin extended release every 12 hours which also will help his symptoms.we'll work on titrating this up and decreasing his when necessary pain medications. In the interim he still has problems with near hemiplegia and it is recommended he go from here to skilled nursing facility  Bacteremia due to Staphylococcus (10/21/2011) Continue on Ancef. Repeat blood cultures will follow. Have tried to get TEE but as noted in interim summary, this has been difficult. Appreciate infectious disease follow-up today. They recommended going ahead and ordering a 2-D echo.the recommendation is that if the blood cultures from 6/10 showed no growth, then order placement of PICC line. He also recommended treating 6/10 as the first day of antibiotics (even though he has been on antibiotics before this) and continuing until July 26. They recommended for him to follow-up with infectious disease said after discharge and at the end of therapy after the full 6-8 weeks.  Peripheral neuropathy (10/26/2011) He has had this for a very long time prior to this admission . Have started Neurontin and are titrating up appropriately.his current on Neurontin 300  mg by mouth 3 times a day. Adjust accordingly over the next few weeks. Watch for sedation.  Acute renal failure: Almost fully resolved secondary to obstructive uropathy. Recheck labs the morning. Leave Foley catheter in for now. Would recommend discussion with urology to see if they need to see as inpatient. possible this could be neurogenic.  LOS: 10 days   Charles Ball 10/28/2011, 10:16 PM

## 2011-10-28 NOTE — Progress Notes (Signed)
Occupational Therapy Treatment Patient Details Name: Charles Ball MRN: [id number] DOB: November 12, 1950 Today's Date: 10/28/2011 Time: 1478-2956 OT Time Calculation (min): 63 min  OT Assessment / Plan / Recommendation Comments on Treatment Session Pt. presents with ARF, epidural abscess, and respiratory failure. Pt. very deconditioned and with left hemiparesis. Pt. with improvement and functional abilities and mobilty today.    Follow Up Recommendations  Skilled nursing facility       Equipment Recommendations  Defer to next venue       Frequency Min 2X/week   Plan Discharge plan remains appropriate    Precautions / Restrictions Precautions Precautions: Fall Required Braces or Orthoses: Cervical Brace Cervical Brace: Soft collar Other Brace/Splint: soft cervical collar       ADL  Grooming: Performed;Minimal assistance;Wash/dry face (Hand over hand of rt. UE) Where Assessed - Grooming: Supported sitting Transfers/Ambulation Related to ADLs: Use of maxi move for bed-chair ADL Comments: Pt. provided with max assist to maintain sitting balance EOB ~6mins while completing ADL tasks and bil UE exercises. Pt. very motivated to improve overall function. Pt. provided with built up tubing to use with utensils and for ADL tasks to increase ability to self feed with rt. hand.       OT Goals Acute Rehab OT Goals OT Goal Formulation: With patient Time For Goal Achievement: 11/06/11 Potential to Achieve Goals: Fair ADL Goals Pt Will Perform Eating: with adaptive utensils;Supine, head of bed up;with min assist ADL Goal: Eating - Progress: Progressing toward goals Pt Will Perform Grooming: with min assist;Sitting, edge of bed;Supported;Other (comment) ADL Goal: Grooming - Progress: Met Additional ADL Goal #1: Pt. will complete bed mobility with mod assist in prep for BADLs ADL Goal: Additional Goal #1 - Progress: Progressing toward goals Additional ADL Goal #2: Pt. will independently  initiate use of soft call bell and direct proper placement of call bell. ADL Goal: Additional Goal #2 - Progress: Progressing toward goals Arm Goals Pt Will Perform AROM: with minimal assist;to maintain range of motion;to decrease contractures;Bilateral upper extremities;2 sets;10 reps Arm Goal: AROM - Progress: Progressing toward goal  Visit Information  Last OT Received On: 10/28/11 Assistance Needed: +2 PT/OT Co-Evaluation/Treatment: Yes          Cognition  Overall Cognitive Status: Appears within functional limits for tasks assessed/performed Arousal/Alertness: Awake/alert Orientation Level: Appears intact for tasks assessed Behavior During Session: [facility] for tasks performed    Mobility Bed Mobility Bed Mobility: Rolling Right;Rolling Left;Right Sidelying to Sit;Sitting - Scoot to [employer] of Bed;Sit to Supine Rolling Right: 1: +2 Total assist Rolling Right: Patient Percentage: 10% Rolling Left: 1: +2 Total assist Rolling Left: Patient Percentage: 40% Supine to Sit: 1: +2 Total assist Supine to Sit: Patient Percentage: 10% Sitting - Scoot to Edge of Bed: 1: +2 Total assist Sitting - Scoot to Edge of Bed: Patient Percentage: 10% Details for Bed Mobility Assistance: Mod verbal cues to facilitate normalized movement patterns with support at trunk and bil LE scooting EOB Transfers Transfer via Lift Equipment: [employer] Details for Transfer Assistance: Bed to chair with maxim[employer]   Exercises General Exercises - Upper Extremity Shoulder Flexion: AAROM;Both;10 reps;Seated Shoulder Extension: AAROM;PROM;Both;5 reps;Seated Shoulder ABduction: PROM;Left;10 reps;Supine Elbow Flexion: AAROM;Both;5 reps;Supine Elbow Extension: AAROM;Both;5 reps Wrist Flexion: PROM;Both;AAROM;10 reps;Supine Wrist Extension: AAROM;PROM;10 reps;Supine;Both Digit Composite Flexion: PROM;AAROM;Both;10 reps;Supine General Exercises - Lower Extremity Long Arc Quad: Both;10 reps;Strengthening;Seated  Balance  Static Sitting Balance Static Sitting - Balance Support: Feet supported;No upper extremity supported Static Sitting - Level of Assistance: 4:  Min assist;3: Mod assist Static Sitting - Comment/# of Minutes: Pt sat EOB x 20-25 minutes with mod A and some periods of min A.   Dynamic Sitting Balance Dynamic Sitting - Balance Support: Feet supported;No upper extremity supported Dynamic Sitting - Level of Assistance: 3: Mod assist (When performing any exercise or functional activity)  End of Session OT - End of Session Activity Tolerance: Patient tolerated treatment well Patient left: with call bell/phone within reach;in chair Nurse Communication: Mobility status;Patient requests pain meds   Cassandria Anger, OTR/L Pager [phone] 10/28/2011, 2:15 PM

## 2011-10-28 NOTE — Progress Notes (Addendum)
Nutrition Follow-up  Pt briefly transferred to ICU on 6/7 in setting of onset unuric renal failure. MBSS completed 6/8 recommending Dysphagia 2 diet with thin liquids.  CWOCN saw pt on 6/9 for full-thickness skin tear.  Pt has been eating mostly 100% of meals. States he really enjoys the food here. Does not like Ensure, will discontinue.  Diet Order:  Dysphagia 2 with thin liquids Supplement: Ensure Complete PO BID  Meds: Scheduled Meds:   . antiseptic oral rinse  15 mL Mouth Rinse QID  .  ceFAZolin (ANCEF) IV  2 g Intravenous Q8H  . chlorhexidine  15 mL Mouth Rinse BID  . feeding supplement  237 mL Oral BID BM  . gabapentin  300 mg Oral BID  . hydrochlorothiazide  12.5 mg Oral Daily  . insulin aspart  0-15 Units Subcutaneous TID AC  . insulin aspart  0-5 Units Subcutaneous QHS  . insulin aspart  4 Units Subcutaneous TID WC  . insulin glargine  5 Units Subcutaneous QHS  . oxyCODONE  30 mg Oral Q12H  . polyethylene glycol  17 g Oral BID  . senna-docusate  2 tablet Oral BID  . DISCONTD: gabapentin  300 mg Oral Daily  . DISCONTD: oxyCODONE  20 mg Oral Q12H   Continuous Infusions:  PRN Meds:.acetaminophen, acetaminophen, ALPRAZolam, fentaNYL, Glycerin (Adult), HYDROmorphone (DILAUDID) injection, menthol-cetylpyridinium, ondansetron (ZOFRAN) IV, oxyCODONE, phenol, DISCONTD: fentaNYL, DISCONTD:  HYDROmorphone (DILAUDID) injection  Labs:  CMP     Component Value Date/Time   NA 134* 10/27/2011 0637   K 4.2 10/27/2011 0637   CL 97 10/27/2011 0637   CO2 27 10/27/2011 0637   GLUCOSE 105* 10/27/2011 0637   BUN 27* 10/27/2011 0637   CREATININE 0.68 10/27/2011 0637   CALCIUM 8.3* 10/27/2011 0637   PROT 5.6* 10/24/2011 1100   ALBUMIN 1.9* 10/24/2011 1100   AST 18 10/24/2011 1100   ALT <5 10/24/2011 1100   ALKPHOS 50 10/24/2011 1100   BILITOT 0.3 10/24/2011 1100   GFRNONAA >90 10/27/2011 0637   GFRAA >90 10/27/2011 0637   CBG (last 3)   Basename 10/28/11 0723 10/27/11 2118 10/27/11 1633  GLUCAP 105*  105* 115*   No results found for this basename: HGBA1C     Intake/Output Summary (Last 24 hours) at 10/28/11 0922 Last data filed at 10/28/11 0631  Gross per 24 hour  Intake    410 ml  Output   3200 ml  Net  -2790 ml    Weight Status:  242 lb; wt trending down to usual weight of 237 lb  Re-estimated needs:  2100 - 2300 kcal; 110-125 grams; > 2.5 L/day  Nutrition Dx:  Inadequate oral intake now r/t decreased appetite AEB limited intake at meals. Resolved.  Goal:  Pt will consume >/= 90% estimated needs; met.   Intervention:  Discontinue Ensure Complete, pt does not like. RD to continue to follow nutrition care plan.  Monitor:  Po intake, diet tolerance  Adair Laundry Pager #:  [phone]

## 2011-10-28 NOTE — Progress Notes (Signed)
Speech Language Pathology Dysphagia Treatment Patient Details Name: Charles Ball MRN: [id number] DOB: 12-08-1950 Today's Date: 10/28/2011 Time: 1152-1203 SLP Time Calculation (min): 11 min  Assessment / Plan / Recommendation Clinical Impression  Pt. seen for dysphagia treatment and possibility of upgrading diet texture.  Pt. reports no difficulty eating or drinking over past several days.  Noted pt. had intermittent cough prior to po's consumed likely due to movement from bed to chair several minutes prior. ral prep, manipulation and transit to posterior oral cavity appeared WFL's. No indications of aspiration with water via straw.  Recommend upgrade to Dys3 (not regular texture as pt's enduance low and Dys 3 will facilitate energy preservation) and continue thin.    Diet Recommendation  Initiate / Change Diet: Dysphagia 3 (mechanical soft);Thin liquid    SLP Plan Continue with current plan of care      Swallowing Goals  SLP Swallowing Goals Swallow Study Goal #1 - Progress: Progressing toward goal Swallow Study Goal #2 - Progress: Progressing toward goal  General Temperature Spikes Noted: Yes Respiratory Status: Room air Behavior/Cognition: Alert;Cooperative;Pleasant mood Oral Cavity - Dentition: Poor condition;Missing dentition Patient Positioning: Upright in chair  Oral Cavity - Oral Hygiene Brush patient's teeth BID with toothbrush (using toothpaste with fluoride): Yes   Dysphagia Treatment Treatment focused on: Patient/family/caregiver education;Skilled observation of diet tolerance Treatment Methods/Modalities: Skilled observation Patient observed directly with PO's: Yes Type of PO's observed: Dysphagia 3 (soft);Thin liquids Feeding: Needs assist Liquids provided via: Straw Type of cueing: Verbal Amount of cueing: Minimal   [name] M.Ed [employer] [phone]  10/28/2011

## 2011-10-28 NOTE — Progress Notes (Signed)
Physical Therapy Treatment Patient Details Name: Charles Ball MRN: [id number] DOB: [birth year] Today's Date: 10/28/2011 Time: 4098-1191 PT Time Calculation (min): 55 min  PT Assessment / Plan / Recommendation Comments on Treatment Session  Pt adm with abscess in neck and with Lt. hemiparesis and weakness in rt side as well.  Some improvement in strength and definite improvement in activity tolerance.    Follow Up Recommendations  Skilled nursing facility    Barriers to Discharge        Equipment Recommendations  Defer to next venue    Recommendations for Other Services    Frequency Min 3X/week   Plan Frequency remains appropriate;Discharge plan needs to be updated    Precautions / Restrictions Precautions Precautions: Fall Required Braces or Orthoses: Cervical Brace Cervical Brace: Soft collar Other Brace/Splint: soft cervical collar   Pertinent Vitals/Pain Lt shoulder 10/10. Nurse gave pain meds.    Mobility  Bed Mobility Bed Mobility: Rolling Right;Rolling Left;Right Sidelying to Sit;Sitting - Scoot to [place] of Bed;Sit to Supine Sitting - Scoot to Edge of Bed: 1: +2 Total assist Sitting - Scoot to [place] of Bed: Patient Percentage: 10% Transfers Transfer via Lift Equipment: [employer] Details for Transfer Assistance: Bed to chair with [employer]    Exercises General Exercises - Lower Extremity Long Arc Quad: Both;10 reps;Strengthening;Seated   PT Diagnosis:    PT Problem List:   PT Treatment Interventions:     PT Goals Acute Rehab PT Goals PT Goal: Rolling Supine to Right Side - Progress: Progressing toward goal PT Goal: Rolling Supine to Left Side - Progress: Progressing toward goal PT Goal: Supine/Side to Sit - Progress: Progressing toward goal PT Goal: Sit at Edge Of Bed - Progress: Progressing toward goal PT Goal: Sit to Supine/Side - Progress: Progressing toward goal PT Transfer Goal: Bed to Chair/Chair to Bed - Progress: Progressing toward goal  Visit  Information  Last PT Received On: 10/28/11 Assistance Needed: +2 PT/OT Co-Evaluation/Treatment: Yes    Subjective Data  Subjective: "I will do whatever you want."   Cognition  Overall Cognitive Status: Appears within functional limits for tasks assessed/performed Arousal/Alertness: Awake/alert Orientation Level: Appears intact for tasks assessed Behavior During Session: [facility] for tasks performed    Balance  Static Sitting Balance Static Sitting - Balance Support: Feet supported;No upper extremity supported Static Sitting - Level of Assistance: 4: Min assist;3: Mod assist Static Sitting - Comment/# of Minutes: Pt sat EOB x 20-25 minutes with mod A and some periods of min A.   Dynamic Sitting Balance Dynamic Sitting - Balance Support: Feet supported;No upper extremity supported Dynamic Sitting - Level of Assistance: 3: Mod assist (When performing any exercise or functional activity)  End of Session PT - End of Session Equipment Utilized During Treatment: Cervical collar Activity Tolerance: Patient tolerated treatment well Patient left: in chair;with call bell/phone within reach (with ST present) Nurse Communication: Need for lift equipment;Mobility status    Charles Ball 10/28/2011, 1:45 PM  [facility] PT [phone]

## 2011-10-29 ENCOUNTER — Inpatient Hospital Stay (HOSPITAL_COMMUNITY): Payer: Medicaid Other

## 2011-10-29 DIAGNOSIS — I339 Acute and subacute endocarditis, unspecified: Secondary | ICD-10-CM

## 2011-10-29 LAB — GLUCOSE, CAPILLARY
Glucose-Capillary: 114 mg/dL — ABNORMAL HIGH (ref 70–99)
Glucose-Capillary: 124 mg/dL — ABNORMAL HIGH (ref 70–99)

## 2011-10-29 LAB — URINALYSIS, ROUTINE W REFLEX MICROSCOPIC
Bilirubin Urine: NEGATIVE
Hgb urine dipstick: NEGATIVE
Nitrite: NEGATIVE
Protein, ur: NEGATIVE mg/dL
Urobilinogen, UA: 1 mg/dL (ref 0.0–1.0)

## 2011-10-29 LAB — BASIC METABOLIC PANEL
BUN: 23 mg/dL (ref 6–23)
CO2: 27 mEq/L (ref 19–32)
Chloride: 97 mEq/L (ref 96–112)
Creatinine, Ser: 0.71 mg/dL (ref 0.50–1.35)

## 2011-10-29 LAB — DIFFERENTIAL
Basophils Absolute: 0 10*3/uL (ref 0.0–0.1)
Basophils Relative: 0 % (ref 0–1)
Eosinophils Absolute: 0.4 10*3/uL (ref 0.0–0.7)
Monocytes Absolute: 1.2 10*3/uL — ABNORMAL HIGH (ref 0.1–1.0)
Monocytes Relative: 6 % (ref 3–12)
Neutrophils Relative %: 84 % — ABNORMAL HIGH (ref 43–77)

## 2011-10-29 LAB — CBC
HCT: 40.4 % (ref 39.0–52.0)
MCH: 29.2 pg (ref 26.0–34.0)
MCV: 88.6 fL (ref 78.0–100.0)
RBC: 4.56 MIL/uL (ref 4.22–5.81)
WBC: 20.5 10*3/uL — ABNORMAL HIGH (ref 4.0–10.5)

## 2011-10-29 MED ORDER — DOCUSATE SODIUM 100 MG PO CAPS
100.0000 mg | ORAL_CAPSULE | Freq: Two times a day (BID) | ORAL | Status: DC
  Administered 2011-10-29 – 2011-10-30 (×2): 100 mg via ORAL
  Filled 2011-10-29 (×5): qty 1

## 2011-10-29 MED ORDER — OXYCODONE HCL 40 MG PO TB12
40.0000 mg | ORAL_TABLET | Freq: Two times a day (BID) | ORAL | Status: DC
  Administered 2011-10-29 – 2011-10-31 (×4): 40 mg via ORAL
  Filled 2011-10-29 (×4): qty 1

## 2011-10-29 NOTE — Progress Notes (Signed)
Subjective: Patient was not able to sleep due to pain. Feels.  BM 2 days ago. No diarrhea. Mild cough.   Objective: Filed Vitals:   10/28/11 1800 10/28/11 2120 10/29/11 0552 10/29/11 1000  BP: 110/64 112/75 122/82 140/81  Pulse: 88 88 74 80  Temp: 99.6 F (37.6 C) 100.7 F (38.2 C) 98.3 F (36.8 C) 99.1 F (37.3 C)  TempSrc: Oral Oral Oral Oral  Resp: 20 18 18 20   Height:      Weight:  104.463 kg (230 lb 4.8 oz)    SpO2: 98% 96% 98% 97%   Weight change: -5.337 kg (-11 lb 12.2 oz)   General: Alert, awake, oriented x3, in no acute distress.  HEENT: No bruits, no goiter.  Heart: Regular rate and rhythm, without murmurs, rubs, gallops.  Lungs: Crackles, bilateral air movement.  Abdomen: Soft, nontender, nondistended, positive bowel sounds.  Neuro: Awake, oriented, right side good movement left side 1/5.  Extremities; no edema.   Lab Results:  Basename 10/29/11 0500 10/27/11 0637  NA 133* 134*  K 4.1 4.2  CL 97 97  CO2 27 27  GLUCOSE 115* 105*  BUN 23 27*  CREATININE 0.71 0.68  CALCIUM 8.7 8.3*  MG -- --  PHOS -- --    Basename 10/29/11 0500 10/27/11 0637  WBC 20.5* 16.7*  NEUTROABS -- --  HGB 13.3 12.6*  HCT 40.4 38.5*  MCV 88.6 89.1  PLT 253 241   Micro Results: Recent Results (from the past 240 hour(s))  WOUND CULTURE     Status: Normal   Collection Time   10/19/11  8:29 PM      Component Value Range Status Comment   Specimen Description WOUND NECK CERVICAL FOUR FIVE DISC SPACE   Final    Special Requests NONE   Final    Gram Stain     Final    Value: RARE WBC PRESENT, PREDOMINANTLY MONONUCLEAR     NO ORGANISMS SEEN     Performed at [facility]   Culture     Final    Value: MODERATE STAPHYLOCOCCUS AUREUS     Note: SUSCEPTIBILITIES PERFORMED ON PREVIOUS CULTURE WITHIN THE LAST 5 DAYS.   Report Status 10/22/2011 FINAL   Final   AFB CULTURE WITH SMEAR     Status: Normal (Preliminary result)   Collection Time   10/19/11  8:29 PM      Component  Value Range Status Comment   Specimen Description WOUND NECK CERVICAL FOUR FIVE DISC SPACE   Final    Special Requests NONE   Final    ACID FAST SMEAR NO ACID FAST BACILLI SEEN   Final    Culture     Final    Value: CULTURE WILL BE EXAMINED FOR 6 WEEKS BEFORE ISSUING A FINAL REPORT   Report Status PENDING   Incomplete   GRAM STAIN     Status: Normal   Collection Time   10/19/11  8:29 PM      Component Value Range Status Comment   Specimen Description WOUND NECK CERVICAL FOUR FIVE DISC SPACE   Final    Special Requests NONE   Final    Gram Stain     Final    Value: RARE WBC PRESENT, PREDOMINANTLY MONONUCLEAR     NO ORGANISMS SEEN   Report Status 10/19/2011 FINAL   Final   ANAEROBIC CULTURE     Status: Normal   Collection Time   10/19/11  8:33 PM  Component Value Range Status Comment   Specimen Description NECK CERVICAL FOUR FIVE DISC SPACE   Final    Special Requests NONE   Final    Gram Stain     Final    Value: RARE WBC PRESENT, PREDOMINANTLY MONONUCLEAR     NO ORGANISMS SEEN     Performed at [facility]   Culture NO ANAEROBES ISOLATED   Final    Report Status 10/25/2011 FINAL   Final   TISSUE CULTURE     Status: Normal   Collection Time   10/19/11  8:34 PM      Component Value Range Status Comment   Specimen Description TISSUE NECK CERVICAL FOUR FIVE DISC   Final    Special Requests NONE   Final    Gram Stain     Final    Value: NO WBC SEEN     NO ORGANISMS SEEN     Performed at [facility]   Culture     Final    Value: MODERATE STAPHYLOCOCCUS AUREUS     Note: RIFAMPIN AND GENTAMICIN SHOULD NOT BE USED AS SINGLE DRUGS FOR TREATMENT OF STAPH INFECTIONS.   Report Status 10/22/2011 FINAL   Final    Organism ID, Bacteria STAPHYLOCOCCUS AUREUS   Final   AFB CULTURE WITH SMEAR     Status: Normal (Preliminary result)   Collection Time   10/19/11  8:34 PM      Component Value Range Status Comment   Specimen Description TISSUE NECK CERVICAL FOUR FIVE DISC   Final     Special Requests NONE   Final    ACID FAST SMEAR NO ACID FAST BACILLI SEEN   Final    Culture     Final    Value: CULTURE WILL BE EXAMINED FOR 6 WEEKS BEFORE ISSUING A FINAL REPORT   Report Status PENDING   Incomplete   GRAM STAIN     Status: Normal   Collection Time   10/19/11  8:34 PM      Component Value Range Status Comment   Specimen Description TISSUE NECK CERVICAL FOUR FIVE DISC   Final    Special Requests NONE   Final    Gram Stain     Final    Value: NO WBC SEEN     NO ORGANISMS SEEN   Report Status 10/19/2011 FINAL   Final   ANAEROBIC CULTURE     Status: Normal   Collection Time   10/19/11  8:37 PM      Component Value Range Status Comment   Specimen Description TISSUE CERIVAL FOUR FIVE DISC   Final    Special Requests NONE   Final    Gram Stain     Final    Value: NO WBC SEEN     NO ORGANISMS SEEN     Performed at [facility]   Culture NO ANAEROBES ISOLATED   Final    Report Status 10/25/2011 FINAL   Final   WOUND CULTURE     Status: Normal   Collection Time   10/19/11 10:14 PM      Component Value Range Status Comment   Specimen Description WOUND NECK CERVICAL FOUR FIVE EPIDURAL DISC SPACE   Final    Special Requests NONE   Final    Gram Stain     Final    Value: FEW WBC PRESENT,BOTH PMN AND MONONUCLEAR     RARE GRAM POSITIVE COCCI IN PAIRS  Performed at [facility]   Culture     Final    Value: MODERATE STAPHYLOCOCCUS AUREUS     Note: RIFAMPIN AND GENTAMICIN SHOULD NOT BE USED AS SINGLE DRUGS FOR TREATMENT OF STAPH INFECTIONS.   Report Status 10/22/2011 FINAL   Final    Organism ID, Bacteria STAPHYLOCOCCUS AUREUS   Final   ANAEROBIC CULTURE     Status: Normal   Collection Time   10/19/11 10:14 PM      Component Value Range Status Comment   Specimen Description NECK CERVICAL FOUR FIVE EPIDURAL DISC SPACE   Final    Special Requests NONE   Final    Gram Stain     Final    Value: FEW WBC PRESENT,BOTH PMN AND MONONUCLEAR     RARE GRAM POSITIVE  COCCI IN PAIRS     Performed at [facility]   Culture NO ANAEROBES ISOLATED   Final    Report Status 10/25/2011 FINAL   Final   GRAM STAIN     Status: Normal   Collection Time   10/19/11 10:14 PM      Component Value Range Status Comment   Specimen Description NECK CERVICAL FOUR FIVE WPIDURAL DISC SPACE   Final    Special Requests NONE   Final    Gram Stain     Final    Value: FEW WBC PRESENT,BOTH PMN AND MONONUCLEAR     RARE GRAM POSITIVE COCCI IN PAIRS   Report Status 10/19/2011 FINAL   Final   AFB CULTURE WITH SMEAR     Status: Normal (Preliminary result)   Collection Time   10/19/11 10:14 PM      Component Value Range Status Comment   Specimen Description NECK CERVICAL FOUR FIVE WPIDURAL DISC SPACE   Final    Special Requests NONE   Final    ACID FAST SMEAR NO ACID FAST BACILLI SEEN   Final    Culture     Final    Value: CULTURE WILL BE EXAMINED FOR 6 WEEKS BEFORE ISSUING A FINAL REPORT   Report Status PENDING   Incomplete   CULTURE, BLOOD (ROUTINE X 2)     Status: Normal   Collection Time   10/20/11  4:40 PM      Component Value Range Status Comment   Specimen Description BLOOD LEFT HAND   Final    Special Requests BOTTLES DRAWN AEROBIC AND ANAEROBIC 5CC   Final    Culture  Setup Time [id number]   Final    Culture NO GROWTH 5 DAYS   Final    Report Status 10/27/2011 FINAL   Final   MRSA PCR SCREENING     Status: Normal   Collection Time   10/24/11  2:42 PM      Component Value Range Status Comment   MRSA by PCR NEGATIVE  NEGATIVE Final     Studies/Results: No results found.  Medications: I have reviewed the patient's current medications.  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 Long-term plan patient will need inpatient rehabilitation. Will increase OxyContin to 40 mg bid. Neurosurgery following. WBC increasing will inform ID.   2-Bacteremia due to Staphylococcus    Continue on Ancef. Repeat blood cultures will follow. Have tried to get TEE but as noted in interim summary, this has been difficult. Appreciate infectious disease follow-up today.  He also recommended treating 6/10 as the first day of antibiotics (  even though he has been on antibiotics before this) and continuing until July 26. They recommended for him to follow-up with infectious disease said after discharge and at the end of therapy after the full 6-8 weeks. ECHO no vegetation identified.   3-Peripheral neuropathy  He has had this for a very long time prior to this admission. Continue with  Neurontin 300 mg by mouth 3 times a day. Adjust accordingly over the next few weeks. Watch for sedation.   4-Acute renal failure: Almost fully resolved secondary to obstructive uropathy. Leave Foley catheter in for now.  possible this could be neurogenic. Creatine Peak to 5. If patient need repeated scan will need to be cautious with contrast.   5-Leukocytosis: Mild fever. Will check UA, will drawn blood culture if spike again. Will check chest x ray.  6-Hyponatremia; Hold HCTZ.  7-Acute respiratory failure More for volume overload which has since improved. Resolved.    LOS: 11 days   Darryn Kydd M.D.  [facility] 10/29/2011, 1:34 PM

## 2011-10-29 NOTE — Progress Notes (Signed)
ANTIBIOTIC CONSULT NOTE - FOLLOW UP  Pharmacy Consult for Cefazolin Indication: MSSA bacteremia and epidural abscess  No Known Allergies  Patient Measurements: Height: 5\' 10"  (177.8 cm) Weight: 230 lb 4.8 oz (104.463 kg) IBW/kg (Calculated) : 73  Adjusted Body Weight:   Vital Signs: Temp: 98.3 F (36.8 C) (06/12 0552) Temp src: Oral (06/12 0552) BP: 122/82 mmHg (06/12 0552) Pulse Rate: 74  (06/12 0552) Intake/Output from previous day: 06/11 0701 - 06/12 0700 In: 720 [P.O.:720] Out: 2100 [Urine:2100] Intake/Output from this shift:    Labs:  Basename 10/29/11 0500 10/27/11 0637  WBC 20.5* 16.7*  HGB 13.3 12.6*  PLT 253 241  LABCREA -- --  CREATININE 0.71 0.68   Estimated Creatinine Clearance: 121.9 ml/min (by C-G formula based on Cr of 0.71). No results found for this basename: VANCOTROUGH:2,VANCOPEAK:2,VANCORANDOM:2,GENTTROUGH:2,GENTPEAK:2,GENTRANDOM:2,TOBRATROUGH:2,TOBRAPEAK:2,TOBRARND:2,AMIKACINPEAK:2,AMIKACINTROU:2,AMIKACIN:2, in the last 72 hours   Microbiology: Recent Results (from the past 720 hour(s))  CULTURE, BLOOD (ROUTINE X 2)     Status: Normal   Collection Time   10/18/11  6:55 PM      Component Value Range Status Comment   Specimen Description BLOOD RIGHT ARM  5 ML IN [facility] BOTTLE   Final    Special Requests NONE   Final    Culture  Setup Time [id number]   Final    Culture     Final    Value: STAPHYLOCOCCUS AUREUS     Note: RIFAMPIN AND GENTAMICIN SHOULD NOT BE USED AS SINGLE DRUGS FOR TREATMENT OF STAPH INFECTIONS.     Note: Gram Stain Report Called to,Read Back By and Verified With: RN S. DILLON ON 10/19/11 AT 1500 BY DTERRY   Report Status 10/21/2011 FINAL   Final    Organism ID, Bacteria STAPHYLOCOCCUS AUREUS   Final   URINE CULTURE     Status: Normal   Collection Time   10/18/11  8:14 PM      Component Value Range Status Comment   Specimen Description URINE, CATHETERIZED   Final    Special Requests NONE   Final    Culture  Setup Time  [id number]   Final    Colony Count NO GROWTH   Final    Culture NO GROWTH   Final    Report Status 10/20/2011 FINAL   Final   CULTURE, BLOOD (ROUTINE X 2)     Status: Normal   Collection Time   10/18/11  9:15 PM      Component Value Range Status Comment   Specimen Description BLOOD LEFT HAND   Final    Special Requests BOTTLES DRAWN AEROBIC AND ANAEROBIC 4CC   Final    Culture  Setup Time [id number]   Final    Culture     Final    Value: STAPHYLOCOCCUS AUREUS     Note: SUSCEPTIBILITIES PERFORMED ON PREVIOUS CULTURE WITHIN THE LAST 5 DAYS.     Note: Gram Stain Report Called to,Read Back By and Verified With: RN S. DILLON ON 10/19/11 AT 1840 BY DTERRY   Report Status 10/21/2011 FINAL   Final   CSF CULTURE     Status: Normal   Collection Time   10/18/11 11:45 PM      Component Value Range Status Comment   Specimen Description CSF   Final    Special Requests Normal   Final    Gram Stain     Final    Value: CYTOSPIN SLIDE WBC PRESENT,BOTH PMN AND MONONUCLEAR     NO ORGANISMS SEEN  Gram Stain Report Called to,Read Back By and Verified With: Gram Stain Report Called to,Read Back By and Verified With: TWOODS RN AT 0141 ON [id number] BY DLONG Performed by [facility]   Culture NO GROWTH 3 DAYS   Final    Report Status 10/22/2011 FINAL   Final   GRAM STAIN     Status: Normal   Collection Time   10/18/11 11:45 PM      Component Value Range Status Comment   Specimen Description CSF   Final    Special Requests Normal   Final    Gram Stain     Final    Value: WBC PRESENT,BOTH PMN AND MONONUCLEAR     NO ORGANISMS SEEN     CYTO SPUN SLIDE     Gram Stain Report Called to,Read Back By and Verified With: TWOODS RN AT 0141 ON [id number] BY DLONG   Report Status 10/19/2011 FINAL   Final   MRSA PCR SCREENING     Status: Normal   Collection Time   10/19/11 12:23 AM      Component Value Range Status Comment   MRSA by PCR NEGATIVE  NEGATIVE Final   WOUND CULTURE     Status: Normal   Collection  Time   10/19/11  8:29 PM      Component Value Range Status Comment   Specimen Description WOUND NECK CERVICAL FOUR FIVE DISC SPACE   Final    Special Requests NONE   Final    Gram Stain     Final    Value: RARE WBC PRESENT, PREDOMINANTLY MONONUCLEAR     NO ORGANISMS SEEN     Performed at [facility]   Culture     Final    Value: MODERATE STAPHYLOCOCCUS AUREUS     Note: SUSCEPTIBILITIES PERFORMED ON PREVIOUS CULTURE WITHIN THE LAST 5 DAYS.   Report Status 10/22/2011 FINAL   Final   AFB CULTURE WITH SMEAR     Status: Normal (Preliminary result)   Collection Time   10/19/11  8:29 PM      Component Value Range Status Comment   Specimen Description WOUND NECK CERVICAL FOUR FIVE DISC SPACE   Final    Special Requests NONE   Final    ACID FAST SMEAR NO ACID FAST BACILLI SEEN   Final    Culture     Final    Value: CULTURE WILL BE EXAMINED FOR 6 WEEKS BEFORE ISSUING A FINAL REPORT   Report Status PENDING   Incomplete   GRAM STAIN     Status: Normal   Collection Time   10/19/11  8:29 PM      Component Value Range Status Comment   Specimen Description WOUND NECK CERVICAL FOUR FIVE DISC SPACE   Final    Special Requests NONE   Final    Gram Stain     Final    Value: RARE WBC PRESENT, PREDOMINANTLY MONONUCLEAR     NO ORGANISMS SEEN   Report Status 10/19/2011 FINAL   Final   ANAEROBIC CULTURE     Status: Normal   Collection Time   10/19/11  8:33 PM      Component Value Range Status Comment   Specimen Description NECK CERVICAL FOUR FIVE DISC SPACE   Final    Special Requests NONE   Final    Gram Stain     Final    Value: RARE WBC PRESENT, PREDOMINANTLY MONONUCLEAR     NO ORGANISMS SEEN  Performed at [facility]   Culture NO ANAEROBES ISOLATED   Final    Report Status 10/25/2011 FINAL   Final   TISSUE CULTURE     Status: Normal   Collection Time   10/19/11  8:34 PM      Component Value Range Status Comment   Specimen Description TISSUE NECK CERVICAL FOUR FIVE DISC   Final      Special Requests NONE   Final    Gram Stain     Final    Value: NO WBC SEEN     NO ORGANISMS SEEN     Performed at [facility]   Culture     Final    Value: MODERATE STAPHYLOCOCCUS AUREUS     Note: RIFAMPIN AND GENTAMICIN SHOULD NOT BE USED AS SINGLE DRUGS FOR TREATMENT OF STAPH INFECTIONS.   Report Status 10/22/2011 FINAL   Final    Organism ID, Bacteria STAPHYLOCOCCUS AUREUS   Final   AFB CULTURE WITH SMEAR     Status: Normal (Preliminary result)   Collection Time   10/19/11  8:34 PM      Component Value Range Status Comment   Specimen Description TISSUE NECK CERVICAL FOUR FIVE DISC   Final    Special Requests NONE   Final    ACID FAST SMEAR NO ACID FAST BACILLI SEEN   Final    Culture     Final    Value: CULTURE WILL BE EXAMINED FOR 6 WEEKS BEFORE ISSUING A FINAL REPORT   Report Status PENDING   Incomplete   GRAM STAIN     Status: Normal   Collection Time   10/19/11  8:34 PM      Component Value Range Status Comment   Specimen Description TISSUE NECK CERVICAL FOUR FIVE DISC   Final    Special Requests NONE   Final    Gram Stain     Final    Value: NO WBC SEEN     NO ORGANISMS SEEN   Report Status 10/19/2011 FINAL   Final   ANAEROBIC CULTURE     Status: Normal   Collection Time   10/19/11  8:37 PM      Component Value Range Status Comment   Specimen Description TISSUE CERIVAL FOUR FIVE DISC   Final    Special Requests NONE   Final    Gram Stain     Final    Value: NO WBC SEEN     NO ORGANISMS SEEN     Performed at [facility]   Culture NO ANAEROBES ISOLATED   Final    Report Status 10/25/2011 FINAL   Final   WOUND CULTURE     Status: Normal   Collection Time   10/19/11 10:14 PM      Component Value Range Status Comment   Specimen Description WOUND NECK CERVICAL FOUR FIVE EPIDURAL DISC SPACE   Final    Special Requests NONE   Final    Gram Stain     Final    Value: FEW WBC PRESENT,BOTH PMN AND MONONUCLEAR     RARE GRAM POSITIVE COCCI IN PAIRS      Performed at [facility]   Culture     Final    Value: MODERATE STAPHYLOCOCCUS AUREUS     Note: RIFAMPIN AND GENTAMICIN SHOULD NOT BE USED AS SINGLE DRUGS FOR TREATMENT OF STAPH INFECTIONS.   Report Status 10/22/2011 FINAL   Final    Organism ID, Bacteria  STAPHYLOCOCCUS AUREUS   Final   ANAEROBIC CULTURE     Status: Normal   Collection Time   10/19/11 10:14 PM      Component Value Range Status Comment   Specimen Description NECK CERVICAL FOUR FIVE EPIDURAL DISC SPACE   Final    Special Requests NONE   Final    Gram Stain     Final    Value: FEW WBC PRESENT,BOTH PMN AND MONONUCLEAR     RARE GRAM POSITIVE COCCI IN PAIRS     Performed at [facility]   Culture NO ANAEROBES ISOLATED   Final    Report Status 10/25/2011 FINAL   Final   GRAM STAIN     Status: Normal   Collection Time   10/19/11 10:14 PM      Component Value Range Status Comment   Specimen Description NECK CERVICAL FOUR FIVE WPIDURAL DISC SPACE   Final    Special Requests NONE   Final    Gram Stain     Final    Value: FEW WBC PRESENT,BOTH PMN AND MONONUCLEAR     RARE GRAM POSITIVE COCCI IN PAIRS   Report Status 10/19/2011 FINAL   Final   AFB CULTURE WITH SMEAR     Status: Normal (Preliminary result)   Collection Time   10/19/11 10:14 PM      Component Value Range Status Comment   Specimen Description NECK CERVICAL FOUR FIVE WPIDURAL DISC SPACE   Final    Special Requests NONE   Final    ACID FAST SMEAR NO ACID FAST BACILLI SEEN   Final    Culture     Final    Value: CULTURE WILL BE EXAMINED FOR 6 WEEKS BEFORE ISSUING A FINAL REPORT   Report Status PENDING   Incomplete   CULTURE, BLOOD (ROUTINE X 2)     Status: Normal   Collection Time   10/20/11  4:40 PM      Component Value Range Status Comment   Specimen Description BLOOD LEFT HAND   Final    Special Requests BOTTLES DRAWN AEROBIC AND ANAEROBIC 5CC   Final    Culture  Setup Time [id number]   Final    Culture NO GROWTH 5 DAYS   Final    Report Status  10/27/2011 FINAL   Final   MRSA PCR SCREENING     Status: Normal   Collection Time   10/24/11  2:42 PM      Component Value Range Status Comment   MRSA by PCR NEGATIVE  NEGATIVE Final     Anti-infectives     Start     Dose/Rate Route Frequency Ordered Stop   10/26/11 1400   ceFAZolin (ANCEF) IVPB 2 g/50 mL premix        2 g 100 mL/hr over 30 Minutes Intravenous 3 times per day 10/26/11 0836     10/25/11 1200   ceFAZolin (ANCEF) IVPB 1 g/50 mL premix  Status:  Discontinued        1 g 100 mL/hr over 30 Minutes Intravenous 3 times per day 10/25/11 1040 10/26/11 0836   10/24/11 2200   ceFAZolin (ANCEF) IVPB 1 g/50 mL premix  Status:  Discontinued        1 g 100 mL/hr over 30 Minutes Intravenous Every 12 hours 10/24/11 1749 10/25/11 1040   10/23/11 2200   ceFAZolin (ANCEF) IVPB 1 g/50 mL premix  Status:  Discontinued        1 g 100  mL/hr over 30 Minutes Intravenous Every 12 hours 10/23/11 1739 10/24/11 1719   10/21/11 1400   ceFAZolin (ANCEF) IVPB 1 g/50 mL premix  Status:  Discontinued        1 g 100 mL/hr over 30 Minutes Intravenous 3 times per day 10/21/11 1202 10/23/11 1739   10/19/11 2007   bacitracin 16109 UNITS injection     Comments: [name], [name]: cabinet override         10/19/11 2007 10/20/11 0814   10/19/11 2005   bacitracin 50,000 Units in sodium chloride irrigation 0.9 % 500 mL irrigation  Status:  Discontinued          As needed 10/19/11 2058 10/19/11 2209   10/19/11 1200   vancomycin (VANCOCIN) 1,250 mg in sodium chloride 0.9 % 250 mL IVPB  Status:  Discontinued        1,250 mg 166.7 mL/hr over 90 Minutes Intravenous Every 12 hours 10/18/11 2157 10/21/11 1202   10/19/11 0200   acyclovir (ZOVIRAX) 930 mg in dextrose 5 % 150 mL IVPB  Status:  Discontinued        10 mg/kg  92.8 kg (Adjusted) 168.6 mL/hr over 60 Minutes Intravenous 3 times per day 10/19/11 0156 10/20/11 1607   10/18/11 2230   ampicillin (OMNIPEN) 2 g in sodium chloride 0.9 % 50 mL IVPB  Status:   Discontinued        2 g 150 mL/hr over 20 Minutes Intravenous Every 4 hours 10/18/11 2130 10/20/11 1607   10/18/11 2200   cefTRIAXone (ROCEPHIN) 2 g in dextrose 5 % 50 mL IVPB  Status:  Discontinued        2 g 100 mL/hr over 30 Minutes Intravenous Every 12 hours 10/18/11 2130 10/20/11 1607   10/18/11 2200   vancomycin (VANCOCIN) IVPB 1000 mg/200 mL premix        1,000 mg 200 mL/hr over 60 Minutes Intravenous  Once 10/18/11 2156 10/19/11 0258   10/18/11 2045   piperacillin-tazobactam (ZOSYN) IVPB 3.375 g  Status:  Discontinued        3.375 g 100 mL/hr over 30 Minutes Intravenous  Once 10/18/11 2029 10/18/11 2204   10/18/11 2045   vancomycin (VANCOCIN) IVPB 1000 mg/200 mL premix        1,000 mg 200 mL/hr over 60 Minutes Intravenous  Once 10/18/11 2029 10/18/11 2235          Assessment: 60yom on Cefazolin Day 9 (total antibiotics Day 11) for MSSA bacteremia and epidural abscess. Patient had Tmax of 100.7 last PM and WBC increased to 20.5. ID has been consulted and is recommending continuing antibiotic therapy for 6-8 weeks (through 7/26). ARF has nearly full resolved with SCr decreasing to 0.71 (CrCl >100 ml/min).   Plan:  1. Continue Cefazolin 2g IV q8h - appropriate for renal function 2. Monitor renal function, UOP, vitals and adjust as indicated 3. Follow-up ID recommendations and antibiotic plans  Cleon Dew [phone] 10/29/2011,10:16 AM

## 2011-10-29 NOTE — Progress Notes (Signed)
Speech Language Pathology Dysphagia Treatment Patient Details Name: Charles Ball MRN: [id number] DOB: 1950/08/30 Today's Date: 10/29/2011 Time: 4696-2952 SLP Time Calculation (min): 13 min  Assessment / Plan / Recommendation Clinical Impression  Patient appears to be tolerating the diet upgrade, Dysphagia 3 with thin liquids, without overt s/s of aspiration.  Patient denies any difficulty, stating "No trouble, I just need more!"    Diet Recommendation  Continue with Current Diet: Dysphagia 3 (mechanical soft);Thin liquid    SLP Plan     Pertinent Vitals/Pain n/a   Swallowing Goals     General Temperature Spikes Noted: Yes Respiratory Status: Room air Behavior/Cognition: Alert;Cooperative;Pleasant mood  Oral Cavity - Oral Hygiene     Dysphagia Treatment Treatment focused on: Skilled observation of diet tolerance;Patient/family/caregiver education Treatment Methods/Modalities: Skilled observation Patient observed directly with PO's: Yes Type of PO's observed: Dysphagia 3 (soft);Thin liquids Feeding: Able to feed self Liquids provided via: Mervyn Gay T 10/29/2011, 4:45 PM

## 2011-10-29 NOTE — Progress Notes (Signed)
Patient ID: Charles Ball, male   DOB: 1950-09-07, 61 y.o.   MRN: [id number] Subjective: Patient reports a bad nights sleep  Objective: Vital signs in last 24 hours: Temp:  [98.3 F (36.8 C)-100.7 F (38.2 C)] 98.3 F (36.8 C) (06/12 0552) Pulse Rate:  [74-88] 74  (06/12 0552) Resp:  [18-20] 18  (06/12 0552) BP: (110-138)/(64-82) 122/82 mmHg (06/12 0552) SpO2:  [96 %-98 %] 98 % (06/12 0552) Weight:  [104.463 kg (230 lb 4.8 oz)] 104.463 kg (230 lb 4.8 oz) (06/11 2120)  Intake/Output from previous day: 06/11 0701 - 06/12 0700 In: 720 [P.O.:720] Out: 2100 [Urine:2100] Intake/Output this shift:    Still w left hemiparesis, though right side is better than pre op as is left leg.  Lab Results:  Basename 10/29/11 0500 10/27/11 0637  WBC 20.5* 16.7*  HGB 13.3 12.6*  HCT 40.4 38.5*  PLT 253 241   BMET  Basename 10/29/11 0500 10/27/11 0637  NA 133* 134*  K 4.1 4.2  CL 97 97  CO2 27 27  GLUCOSE 115* 105*  BUN 23 27*  CREATININE 0.71 0.68  CALCIUM 8.7 8.3*    Studies/Results: No results found.  Assessment/Plan: Stable with slow improvement. The issue is whether he could have residual infection and could benefit from futher imaging studies of hsi neck. He does appear to be slowly better and might just be best to give this more time as he is definitely not getting worse, and there was no big abscess in his spinal canal.  LOS: 11 days  As above   Reinaldo Meeker, MD 10/29/2011, 9:15 AM

## 2011-10-29 NOTE — Progress Notes (Signed)
  Echocardiogram 2D Echocardiogram has been performed.  Charles, Ball 10/29/2011, 11:19 AM

## 2011-10-29 NOTE — Clinical Social Work Note (Addendum)
CSW talked by phone with girlfriend regarding bed offers and confirmed that [facility] [place] and [facility] in [place] remain the only offers. Ms. Charles Ball will talk today with patient to determine his preference and will get back with CSW.  CSW received call later this date from Ms. Charles Ball regarding SNF decision and patient has chosen [facility] and [address].  Charles Ball, MSW, LCSW [phone]

## 2011-10-29 NOTE — Progress Notes (Signed)
INFECTIOUS DISEASE PROGRESS NOTE  ID: Charles Ball is a 61 y.o. male found down on 10/18/11 2/2 AMS found to have MSSA C4-C5 epidural abscess and bacteremia c/b ARF, now resolved. Currently on cefazolin.  Subjective:  Feels poorly. Febrile last night. Had transthoracic echo  24hr event: had isolated temp of 100.4 and wbc up from 15 to 20  Abtx:  Cefazolin 2gm VI Q 8hr  Medications:     . antiseptic oral rinse  15 mL Mouth Rinse QID  .  ceFAZolin (ANCEF) IV  2 g Intravenous Q8H  . chlorhexidine  15 mL Mouth Rinse BID  . docusate sodium  100 mg Oral BID  . gabapentin  300 mg Oral TID  . insulin aspart  0-15 Units Subcutaneous TID AC  . insulin aspart  0-5 Units Subcutaneous QHS  . insulin aspart  4 Units Subcutaneous TID WC  . insulin glargine  5 Units Subcutaneous QHS  . oxyCODONE  40 mg Oral Q12H  . polyethylene glycol  17 g Oral BID  . senna-docusate  2 tablet Oral BID  . DISCONTD: gabapentin  300 mg Oral BID  . DISCONTD: hydrochlorothiazide  12.5 mg Oral Daily  . DISCONTD: oxyCODONE  30 mg Oral Q12H    Objective: Vital signs in last 24 hours: Temp:  [98.3 F (36.8 C)-100.7 F (38.2 C)] 99.1 F (37.3 C) (06/12 1000) Pulse Rate:  [74-88] 80  (06/12 1000) Resp:  [18-20] 20  (06/12 1000) BP: (110-140)/(64-82) 140/81 mmHg (06/12 1000) SpO2:  [96 %-98 %] 97 % (06/12 1000) Weight:  [230 lb 4.8 oz (104.463 kg)] 230 lb 4.8 oz (104.463 kg) (06/11 2120)  Gen= a xo by 3 in NAD HEENT= [place]/AT, perrla, eomi, no scleral icterus, op clear Neck= sutures at right side of neck c/d/i, no lad Pulm= ctab no w/c/r in ant lung fields Cors= nl s1,s2, but distant heart sounds to ascultate murmur Abd= mildly distended, soft, nontender, non distended Ext= trace edema LE bilaterally, knees non tender, no effusion, no erythema, no warmth   Lab Results  Basename 10/29/11 0500 10/27/11 0637  WBC 20.5* 16.7*  HGB 13.3 12.6*  HCT 40.4 38.5*  NA 133* 134*  K 4.1 4.2  CL 97 97  CO2 27 27    BUN 23 27*  CREATININE 0.71 0.68  GLU -- --    Microbiology: Recent Results (from the past 240 hour(s))  WOUND CULTURE     Status: Normal   Collection Time   10/19/11  8:29 PM      Component Value Range Status Comment   Specimen Description WOUND NECK CERVICAL FOUR FIVE DISC SPACE   Final    Special Requests NONE   Final    Gram Stain     Final    Value: RARE WBC PRESENT, PREDOMINANTLY MONONUCLEAR     NO ORGANISMS SEEN     Performed at [facility]   Culture     Final    Value: MODERATE STAPHYLOCOCCUS AUREUS     Note: SUSCEPTIBILITIES PERFORMED ON PREVIOUS CULTURE WITHIN THE LAST 5 DAYS.   Report Status 10/22/2011 FINAL   Final   AFB CULTURE WITH SMEAR     Status: Normal (Preliminary result)   Collection Time   10/19/11  8:29 PM      Component Value Range Status Comment   Specimen Description WOUND NECK CERVICAL FOUR FIVE DISC SPACE   Final    Special Requests NONE   Final    ACID  FAST SMEAR NO ACID FAST BACILLI SEEN   Final    Culture     Final    Value: CULTURE WILL BE EXAMINED FOR 6 WEEKS BEFORE ISSUING A FINAL REPORT   Report Status PENDING   Incomplete   GRAM STAIN     Status: Normal   Collection Time   10/19/11  8:29 PM      Component Value Range Status Comment   Specimen Description WOUND NECK CERVICAL FOUR FIVE DISC SPACE   Final    Special Requests NONE   Final    Gram Stain     Final    Value: RARE WBC PRESENT, PREDOMINANTLY MONONUCLEAR     NO ORGANISMS SEEN   Report Status 10/19/2011 FINAL   Final   ANAEROBIC CULTURE     Status: Normal   Collection Time   10/19/11  8:33 PM      Component Value Range Status Comment   Specimen Description NECK CERVICAL FOUR FIVE DISC SPACE   Final    Special Requests NONE   Final    Gram Stain     Final    Value: RARE WBC PRESENT, PREDOMINANTLY MONONUCLEAR     NO ORGANISMS SEEN     Performed at [facility]   Culture NO ANAEROBES ISOLATED   Final    Report Status 10/25/2011 FINAL   Final   TISSUE CULTURE      Status: Normal   Collection Time   10/19/11  8:34 PM      Component Value Range Status Comment   Specimen Description TISSUE NECK CERVICAL FOUR FIVE DISC   Final    Special Requests NONE   Final    Gram Stain     Final    Value: NO WBC SEEN     NO ORGANISMS SEEN     Performed at [facility]   Culture     Final    Value: MODERATE STAPHYLOCOCCUS AUREUS     Note: RIFAMPIN AND GENTAMICIN SHOULD NOT BE USED AS SINGLE DRUGS FOR TREATMENT OF STAPH INFECTIONS.   Report Status 10/22/2011 FINAL   Final    Organism ID, Bacteria STAPHYLOCOCCUS AUREUS   Final   AFB CULTURE WITH SMEAR     Status: Normal (Preliminary result)   Collection Time   10/19/11  8:34 PM      Component Value Range Status Comment   Specimen Description TISSUE NECK CERVICAL FOUR FIVE DISC   Final    Special Requests NONE   Final    ACID FAST SMEAR NO ACID FAST BACILLI SEEN   Final    Culture     Final    Value: CULTURE WILL BE EXAMINED FOR 6 WEEKS BEFORE ISSUING A FINAL REPORT   Report Status PENDING   Incomplete   GRAM STAIN     Status: Normal   Collection Time   10/19/11  8:34 PM      Component Value Range Status Comment   Specimen Description TISSUE NECK CERVICAL FOUR FIVE DISC   Final    Special Requests NONE   Final    Gram Stain     Final    Value: NO WBC SEEN     NO ORGANISMS SEEN   Report Status 10/19/2011 FINAL   Final   ANAEROBIC CULTURE     Status: Normal   Collection Time   10/19/11  8:37 PM      Component Value Range Status Comment   Specimen Description  TISSUE CERIVAL FOUR FIVE DISC   Final    Special Requests NONE   Final    Gram Stain     Final    Value: NO WBC SEEN     NO ORGANISMS SEEN     Performed at [facility]   Culture NO ANAEROBES ISOLATED   Final    Report Status 10/25/2011 FINAL   Final   WOUND CULTURE     Status: Normal   Collection Time   10/19/11 10:14 PM      Component Value Range Status Comment   Specimen Description WOUND NECK CERVICAL FOUR FIVE EPIDURAL DISC SPACE    Final    Special Requests NONE   Final    Gram Stain     Final    Value: FEW WBC PRESENT,BOTH PMN AND MONONUCLEAR     RARE GRAM POSITIVE COCCI IN PAIRS     Performed at [facility]   Culture     Final    Value: MODERATE STAPHYLOCOCCUS AUREUS     Note: RIFAMPIN AND GENTAMICIN SHOULD NOT BE USED AS SINGLE DRUGS FOR TREATMENT OF STAPH INFECTIONS.   Report Status 10/22/2011 FINAL   Final    Organism ID, Bacteria STAPHYLOCOCCUS AUREUS   Final   ANAEROBIC CULTURE     Status: Normal   Collection Time   10/19/11 10:14 PM      Component Value Range Status Comment   Specimen Description NECK CERVICAL FOUR FIVE EPIDURAL DISC SPACE   Final    Special Requests NONE   Final    Gram Stain     Final    Value: FEW WBC PRESENT,BOTH PMN AND MONONUCLEAR     RARE GRAM POSITIVE COCCI IN PAIRS     Performed at [facility]   Culture NO ANAEROBES ISOLATED   Final    Report Status 10/25/2011 FINAL   Final   GRAM STAIN     Status: Normal   Collection Time   10/19/11 10:14 PM      Component Value Range Status Comment   Specimen Description NECK CERVICAL FOUR FIVE WPIDURAL DISC SPACE   Final    Special Requests NONE   Final    Gram Stain     Final    Value: FEW WBC PRESENT,BOTH PMN AND MONONUCLEAR     RARE GRAM POSITIVE COCCI IN PAIRS   Report Status 10/19/2011 FINAL   Final   AFB CULTURE WITH SMEAR     Status: Normal (Preliminary result)   Collection Time   10/19/11 10:14 PM      Component Value Range Status Comment   Specimen Description NECK CERVICAL FOUR FIVE WPIDURAL DISC SPACE   Final    Special Requests NONE   Final    ACID FAST SMEAR NO ACID FAST BACILLI SEEN   Final    Culture     Final    Value: CULTURE WILL BE EXAMINED FOR 6 WEEKS BEFORE ISSUING A FINAL REPORT   Report Status PENDING   Incomplete   CULTURE, BLOOD (ROUTINE X 2)     Status: Normal   Collection Time   10/20/11  4:40 PM      Component Value Range Status Comment   Specimen Description BLOOD LEFT HAND   Final     Special Requests BOTTLES DRAWN AEROBIC AND ANAEROBIC 5CC   Final    Culture  Setup Time [id number]   Final    Culture NO GROWTH 5 DAYS  Final    Report Status 10/27/2011 FINAL   Final   MRSA PCR SCREENING     Status: Normal   Collection Time   10/24/11  2:42 PM      Component Value Range Status Comment   MRSA by PCR NEGATIVE  NEGATIVE Final     Studies/Results: No results found.   Assessment/Plan:  New fever = would do pan culture of blood, sputum, urine. Please get new foley catheter prior to gettting ua/urine culture. Helpful to look at cxr as well. Would not empirically change antibiotics since he is hemodynamically stable for now.  MSSA bacteremia =  Length of therapy for antibiotics will be 6- 8wks for treatment of epidural abscess.TTE did not see vegetation, although not as sensitive as TEE. No need to pursue TEE at this time since we are treating for extended course of therapy for epidural abscess.  MSSA Epidural abscess= would treat for 6- 8 wks. Count day #1 of antibiotics as 6/10      Karl Knarr Infectious Diseases 10/29/2011, 1:38 PM

## 2011-10-30 DIAGNOSIS — R509 Fever, unspecified: Secondary | ICD-10-CM

## 2011-10-30 DIAGNOSIS — J329 Chronic sinusitis, unspecified: Secondary | ICD-10-CM

## 2011-10-30 DIAGNOSIS — G039 Meningitis, unspecified: Secondary | ICD-10-CM

## 2011-10-30 LAB — GLUCOSE, CAPILLARY
Glucose-Capillary: 103 mg/dL — ABNORMAL HIGH (ref 70–99)
Glucose-Capillary: 125 mg/dL — ABNORMAL HIGH (ref 70–99)
Glucose-Capillary: 130 mg/dL — ABNORMAL HIGH (ref 70–99)

## 2011-10-30 LAB — DIFFERENTIAL
Basophils Absolute: 0.1 10*3/uL (ref 0.0–0.1)
Lymphocytes Relative: 13 % (ref 12–46)
Lymphs Abs: 2.5 10*3/uL (ref 0.7–4.0)
Monocytes Absolute: 1.3 10*3/uL — ABNORMAL HIGH (ref 0.1–1.0)
Monocytes Relative: 7 % (ref 3–12)
Neutro Abs: 14.8 10*3/uL — ABNORMAL HIGH (ref 1.7–7.7)

## 2011-10-30 LAB — CBC
HCT: 37 % — ABNORMAL LOW (ref 39.0–52.0)
Hemoglobin: 12.6 g/dL — ABNORMAL LOW (ref 13.0–17.0)
RBC: 4.21 MIL/uL — ABNORMAL LOW (ref 4.22–5.81)
WBC: 19.1 10*3/uL — ABNORMAL HIGH (ref 4.0–10.5)

## 2011-10-30 MED ORDER — CAMPHOR-MENTHOL 0.5-0.5 % EX LOTN
TOPICAL_LOTION | CUTANEOUS | Status: DC | PRN
  Filled 2011-10-30: qty 222

## 2011-10-30 MED ORDER — TAMSULOSIN HCL 0.4 MG PO CAPS
0.4000 mg | ORAL_CAPSULE | Freq: Every day | ORAL | Status: DC
  Administered 2011-10-30 – 2011-10-31 (×2): 0.4 mg via ORAL
  Filled 2011-10-30 (×2): qty 1

## 2011-10-30 NOTE — Progress Notes (Signed)
Subjective: Feeling ok, no bowel movement no worsening pain.  No cough.  Objective: Filed Vitals:   10/29/11 1800 10/29/11 2046 10/30/11 0512 10/30/11 0954  BP: 112/66 116/71 102/70 121/69  Pulse: 84 85 77 80  Temp: 98.7 F (37.1 C) 99.1 F (37.3 C) 98.8 F (37.1 C) 98.8 F (37.1 C)  TempSrc: Oral Oral Oral Oral  Resp: 20 20 20 20   Height:      Weight:  95.6 kg (210 lb 12.2 oz)    SpO2: 97% 98% 96% 96%   Weight change: -8.863 kg (-19 lb 8.6 oz)   General: Alert, awake, oriented x3, in no acute distress.  HEENT: No bruits, no goiter.  Heart: Regular rate and rhythm, without murmurs, rubs, gallops.  Lungs: CTA, bilateral air movement.  Abdomen: Soft, nontender, nondistended, positive bowel sounds.  Neuro: right 5/5 left 1/5 Extremities; no edema.   Lab Results:  Basename 10/29/11 0500  NA 133*  K 4.1  CL 97  CO2 27  GLUCOSE 115*  BUN 23  CREATININE 0.71  CALCIUM 8.7  MG --  PHOS --    Basename 10/30/11 0515 10/29/11 1422 10/29/11 0500  WBC 19.1* -- 20.5*  NEUTROABS 14.8* 17.6* --  HGB 12.6* -- 13.3  HCT 37.0* -- 40.4  MCV 87.9 -- 88.6  PLT 260 -- 253    Micro Results: Recent Results (from the past 240 hour(s))  CULTURE, BLOOD (ROUTINE X 2)     Status: Normal   Collection Time   10/20/11  4:40 PM      Component Value Range Status Comment   Specimen Description BLOOD LEFT HAND   Final    Special Requests BOTTLES DRAWN AEROBIC AND ANAEROBIC 5CC   Final    Culture  Setup Time [id number]   Final    Culture NO GROWTH 5 DAYS   Final    Report Status 10/27/2011 FINAL   Final   MRSA PCR SCREENING     Status: Normal   Collection Time   10/24/11  2:42 PM      Component Value Range Status Comment   MRSA by PCR NEGATIVE  NEGATIVE Final     Studies/Results: Dg Chest 2 View  10/29/2011  *RADIOLOGY REPORT*  Clinical Data: Fever and shortness of breath  CHEST - 2 VIEW  Comparison: 10/25/2011  Findings: Heart size is normal.  No pleural effusion or edema.  No  airspace consolidation.  Coarsened interstitial markings appear similar to previous exam.  IMPRESSION:  1.  No acute cardiopulmonary abnormalities.  Original Report Authenticated By: Rosealee Albee, M.D.    Medications: I have reviewed the patient's current medications.  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 Long-term plan patient will need inpatient rehabilitation. Will increase OxyContin to 40 mg bid. Neurosurgery following. WBC mildly decrease to 19.   2-Bacteremia due to Staphylococcus  Continue on Ancef. Repeat blood cultures will follow. Have tried to get TEE but as noted in interim summary, this has been difficult. Appreciate infectious disease follow-up today. He also recommended treating 6/10 as the first day of antibiotics (even though he has been on antibiotics before this) and continuing until July 26. They recommended for him to follow-up with infectious disease said after discharge and at the end of therapy after the full 6-8 weeks. ECHO no vegetation identified.  Antibiotics day 3/6 -8 weeks.   3-Peripheral neuropathy  He has had this for a very long  time prior to this admission. Continue with Neurontin 300 mg by mouth 3 times a day. Adjust accordingly over the next few weeks. Watch for sedation.  4-Acute renal failure: Almost fully resolved secondary to obstructive uropathy. Leave Foley catheter in for now. possible this could be neurogenic. Creatine Peak to 5. If patient need repeated scan will need to be cautious with contrast. I spoke with urology recommend to keep foley, start Flomax and follow up outpatient.  5-Leukocytosis:  UA negative,  will drawn blood culture if spike again. chest x ray negative.  6-Hyponatremia; Hold HCTZ.  7-Acute respiratory failure More for volume overload which has since improved. Resolved.       LOS: 12 days   Alexys Lobello M.D.  [facility] 10/30/2011, 12:19 PM

## 2011-10-30 NOTE — Progress Notes (Addendum)
PT/OT Cancellation Note  Treatment cancelled today due to patient's refusal to participate due to fatigue. Will re-attempt tomorrow per pt. RequestCassandria Anger, OTR/L Pager [phone] 10/30/2011, 12:17 PM

## 2011-10-30 NOTE — Progress Notes (Signed)
INFECTIOUS DISEASE PROGRESS NOTE  ID: Charles Ball is a 61 y.o. male found down on 10/18/11 2/2 AMS found to have MSSA C4-C5 epidural abscess and bacteremia c/b ARF, now resolved. Currently on cefazolin.  Subjective:  Feeling better. No complaint. Afebrile for 36hr    Abtx:  Cefazolin 2gm VI Q 8hr  Medications:     . antiseptic oral rinse  15 mL Mouth Rinse QID  .  ceFAZolin (ANCEF) IV  2 g Intravenous Q8H  . chlorhexidine  15 mL Mouth Rinse BID  . docusate sodium  100 mg Oral BID  . gabapentin  300 mg Oral TID  . insulin aspart  0-15 Units Subcutaneous TID AC  . insulin aspart  0-5 Units Subcutaneous QHS  . insulin aspart  4 Units Subcutaneous TID WC  . insulin glargine  5 Units Subcutaneous QHS  . oxyCODONE  40 mg Oral Q12H  . polyethylene glycol  17 g Oral BID  . senna-docusate  2 tablet Oral BID  . Tamsulosin HCl  0.4 mg Oral Daily    Objective: Vital signs in last 24 hours: Temp:  [98 F (36.7 C)-99.1 F (37.3 C)] 98 F (36.7 C) (06/13 1400) Pulse Rate:  [77-85] 80  (06/13 1400) Resp:  [20] 20  (06/13 1400) BP: (102-121)/(62-71) 118/62 mmHg (06/13 1400) SpO2:  [96 %-98 %] 96 % (06/13 1400) Weight:  [210 lb 12.2 oz (95.6 kg)] 210 lb 12.2 oz (95.6 kg) (06/12 2046)  Gen= a xo by 3 in NAD HEENT= [place]/AT, perrla, eomi, no scleral icterus, op clear Neck= sutures at right side of neck c/d/i, no lad Pulm= ctab no w/c/r in ant lung fields Cors= nl s1,s2, but distant heart sounds to ascultate murmur Abd= mildly distended, soft, nontender, non distended Ext= trace edema LE bilaterally, knees non tender, no effusion, no erythema, no warmth   Lab Results  Basename 10/30/11 0515 10/29/11 0500  WBC 19.1* 20.5*  HGB 12.6* 13.3  HCT 37.0* 40.4  NA -- 133*  K -- 4.1  CL -- 97  CO2 -- 27  BUN -- 23  CREATININE -- 0.71  GLU -- --    Microbiology: Recent Results (from the past 240 hour(s))  MRSA PCR SCREENING     Status: Normal   Collection Time   10/24/11  2:42 PM       Component Value Range Status Comment   MRSA by PCR NEGATIVE  NEGATIVE Final     Studies/Results: Dg Chest 2 View  10/29/2011  *RADIOLOGY REPORT*  Clinical Data: Fever and shortness of breath  CHEST - 2 VIEW  Comparison: 10/25/2011  Findings: Heart size is normal.  No pleural effusion or edema.  No airspace consolidation.  Coarsened interstitial markings appear similar to previous exam.  IMPRESSION:  1.  No acute cardiopulmonary abnormalities.  Original Report Authenticated By: Rosealee Albee, M.D.     Assessment/Plan:  Isolated fever= ua negative. cxr negative for new infiltrate. Urine cx ngtd. If he has recurrent temp would get blood cx. For now it appears to be isolated event. Afebrile for 36hr with no additional intervention  MSSA bacteremia =  Length of therapy for antibiotics will be 6- 8wks for treatment of epidural abscess.TTE did not see vegetation, although not as sensitive as TEE. No need to pursue TEE at this time since we are treating for extended course of therapy for epidural abscess.  MSSA Epidural abscess= would treat for 6- 8 wks. Count day #1 of antibiotics as 6/10  Khaden Gater Infectious Diseases 10/30/2011, 5:00 PM

## 2011-10-30 NOTE — Progress Notes (Signed)
Charles Ball, PT [phone]  

## 2011-10-31 LAB — CBC
MCV: 88.4 fL (ref 78.0–100.0)
Platelets: 218 10*3/uL (ref 150–400)
RDW: 13.4 % (ref 11.5–15.5)
WBC: 13.6 10*3/uL — ABNORMAL HIGH (ref 4.0–10.5)

## 2011-10-31 LAB — BASIC METABOLIC PANEL
Chloride: 99 mEq/L (ref 96–112)
Creatinine, Ser: 0.69 mg/dL (ref 0.50–1.35)
GFR calc Af Amer: >90 mL/min (ref >90)

## 2011-10-31 LAB — URINE CULTURE

## 2011-10-31 LAB — GLUCOSE, CAPILLARY: Glucose-Capillary: 105 mg/dL — ABNORMAL HIGH (ref 70–99)

## 2011-10-31 MED ORDER — SODIUM CHLORIDE 0.9 % IJ SOLN: 10.0000 mL | INTRAMUSCULAR | Status: DC | PRN

## 2011-10-31 MED ORDER — GABAPENTIN 300 MG PO CAPS: 300.0000 mg | ORAL_CAPSULE | Freq: Three times a day (TID) | ORAL | Status: AC

## 2011-10-31 MED ORDER — ALPRAZOLAM 0.5 MG PO TABS: 0.5000 mg | ORAL_TABLET | Freq: Three times a day (TID) | ORAL | Status: AC | PRN

## 2011-10-31 MED ORDER — OXYCODONE HCL 40 MG PO TB12: 40.0000 mg | ORAL_TABLET | Freq: Two times a day (BID) | ORAL | Status: DC

## 2011-10-31 MED ORDER — TAMSULOSIN HCL 0.4 MG PO CAPS: 0.4000 mg | ORAL_CAPSULE | Freq: Every day | ORAL | Status: AC

## 2011-10-31 MED ORDER — DSS 100 MG PO CAPS: 100.0000 mg | ORAL_CAPSULE | Freq: Two times a day (BID) | ORAL | Status: AC

## 2011-10-31 MED ORDER — INSULIN ASPART 100 UNIT/ML ~~LOC~~ SOLN: 4.0000 [IU] | Freq: Three times a day (TID) | SUBCUTANEOUS | Status: AC

## 2011-10-31 MED ORDER — INSULIN GLARGINE 100 UNIT/ML ~~LOC~~ SOLN: 5.0000 [IU] | Freq: Every day | SUBCUTANEOUS | Status: AC

## 2011-10-31 MED ORDER — SENNOSIDES-DOCUSATE SODIUM 8.6-50 MG PO TABS: 2.0000 | ORAL_TABLET | Freq: Two times a day (BID) | ORAL | Status: AC

## 2011-10-31 MED ORDER — OXYCODONE HCL 15 MG PO TABS: 15.0000 mg | ORAL_TABLET | ORAL | Status: AC | PRN

## 2011-10-31 MED ORDER — CEFAZOLIN SODIUM-DEXTROSE 2-3 GM-% IV SOLR: 2.0000 g | Freq: Three times a day (TID) | INTRAVENOUS | Status: DC

## 2011-10-31 MED ORDER — POLYETHYLENE GLYCOL 3350 17 G PO PACK: 17.0000 g | PACK | Freq: Two times a day (BID) | ORAL | Status: AC

## 2011-10-31 NOTE — Progress Notes (Signed)
Peripherally Inserted Central Catheter/Midline Placement  The IV Nurse has discussed with the patient and/or persons authorized to consent for the patient, the purpose of this procedure and the potential benefits and risks involved with this procedure.  The benefits include less needle sticks, lab draws from the catheter and patient may be discharged home with the catheter.  Risks include, but not limited to, infection, bleeding, blood clot (thrombus formation), and puncture of an artery; nerve damage and irregular heat beat.  Alternatives to this procedure were also discussed.  PICC/Midline Placement Documentation        Charles Ball 10/31/2011, 11:55 AM

## 2011-10-31 NOTE — Progress Notes (Signed)
Physical Therapy Treatment Patient Details Name: Charles Ball MRN: [id number] DOB: Jan 13, 1951 Today's Date: 10/31/2011 Time: 4098-1191 PT Time Calculation (min): 21 min  PT Assessment / Plan / Recommendation Comments on Treatment Session  Pt. was more limited in his activity tolerance today due to his shoulder pain.      Follow Up Recommendations  Skilled nursing facility    Barriers to Discharge        Equipment Recommendations  Defer to next venue    Recommendations for Other Services    Frequency Min 3X/week   Plan Frequency remains appropriate;Discharge plan needs to be updated    Precautions / Restrictions Precautions Precautions: Fall Required Braces or Orthoses: Cervical Brace Cervical Brace: Soft collar Other Brace/Splint: soft cervical collar   Pertinent Vitals/Pain  Pt. C/o shoulder pain L > R , rated at 10/10.  RN made aware.  Pt's UE's supported with pillows in effort to increase comfort.    Mobility  Bed Mobility Bed Mobility: Rolling Left;Left Sidelying to Sit;Sitting - Scoot to [place] of Bed;Sit to Sidelying Left;Rolling Right Rolling Right: 1: +2 Total assist Rolling Right: Patient Percentage: 10% Rolling Left: 1: +2 Total assist Rolling Left: Patient Percentage: 40% Left Sidelying to Sit: 1: +2 Total assist Left Sidelying to Sit: Patient Percentage: 20% Sitting - Scoot to Edge of Bed: 1: +2 Total assist Sitting - Scoot to Edge of Bed: Patient Percentage: 10% Sit to Sidelying Left: 1: +2 Total assist Sit to Sidelying Left: Patient Percentage: 10% Details for Bed Mobility Assistance: vc's to facilitate appropriate movement in bed mobility efforts Transfers Transfers: Not assessed    Exercises     PT Diagnosis:    PT Problem List:   PT Treatment Interventions:     PT Goals Acute Rehab PT Goals PT Goal: Rolling Supine to Right Side - Progress: Progressing toward goal PT Goal: Rolling Supine to Left Side - Progress: Progressing toward goal PT  Goal: Supine/Side to Sit - Progress: Progressing toward goal PT Goal: Sit at Edge Of Bed - Progress: Progressing toward goal PT Goal: Sit to Supine/Side - Progress: Progressing toward goal  Visit Information  Last PT Received On: 10/31/11 Assistance Needed: +2    Subjective Data  Subjective: "I'm getting ready to hurt"   Cognition  Overall Cognitive Status: Appears within functional limits for tasks assessed/performed Arousal/Alertness: Awake/alert Orientation Level: Appears intact for tasks assessed Behavior During Session: [facility] for tasks performed    Balance  Static Sitting Balance Static Sitting - Balance Support: Feet supported;Right upper extremity supported;Left upper extremity supported (left arm supported on pillows) Static Sitting - Level of Assistance: 3: Mod assist Static Sitting - Comment/# of Minutes: 10  End of Session PT - End of Session Equipment Utilized During Treatment: Cervical collar Activity Tolerance: Patient limited by pain;Other (comment) (in shoulders, L > R) Patient left: in bed;with call bell/phone within reach;with bed alarm set Nurse Communication: Need for lift equipment;Mobility status    Ferman Hamming 10/31/2011, 11:14 AM Weldon Picking PT Acute Rehab Services [phone] [place] [phone]

## 2011-10-31 NOTE — Progress Notes (Signed)
Pt left for [facility].

## 2011-10-31 NOTE — Clinical Social Work Note (Signed)
Mr. Aikey is being discharged to [facility] today. Discharge information forwarded to facility, including LOG as patient has pending Medicaid and disability applications.  CSW facilitated transport to SNF via ambulance. Patient's girlfriend advised that patient in route to facility.  Genelle Bal, MSW, LCSW [phone]

## 2011-10-31 NOTE — Progress Notes (Signed)
Attempted to see pt today but he is currently getting a PICC line.  Will attempt back as schedule allows.   Tory Emerald, [place] [phone]

## 2011-10-31 NOTE — Discharge Summary (Signed)
Physician Discharge Summary  Charles Ball VWU:[id number] DOB: 11-18-1950 DOA: 10/18/2011  PCP: No primary provider on file.  Admit date: 10/18/2011 Discharge date: 10/31/2011  Discharge Diagnoses:  Epidural abscess  Bacteremia due to Staphylococcus Acute respiratory failure, secondary to pulmonary edema, resolved. Encephalopathy, secondary to infection, resolved. Cervical (neck) region somatic dysfunction Hyperglycemia Peripheral neuropathy Acute renal failure, secondary to obstructive uropathy. Hyponatremia, secondary to decrease volume, and HCTZ. Thrombocytopenia, secondary to infection, resolved.  Discharge Condition: Stable.  Disposition:  Follow-up Information    Follow up with Charles Meeker, MD in 1 week.   Contact information:   [address], [address] [place] [address] [phone]       Please follow up. (need to follow up with urologist)       Follow up with Charles Munson, MD in 3 weeks.   Contact information:   [address] [place] [address] [phone]          Diet: Dysphagia 3 diet.  History of present illness:  Mr. Charles Ball is a 61 year-old gentleman who was found down at home. He had likely fallen out of bed, and indicated he was unable to get up. He was conversant at that time, and denied loss of consciousness. He subsequently became progressively altered and febrile. CT head showed air and fluid in right maxillary sinus.    Hospital Course:   Patient is a 61 year old white male with past medical history of hypertension and peripheral neuropathy, who was found down at home and initially had fallen out of bed but felt like he was unable to get up. He initially was able to converse in the emergency room and denied loss of consciousness but then became progressively altered mental status and febrile. He is admitted to the pulmonary critical care service for altered mental status and critically ill  multiorgan system failure. he was noted on admission to have a sodium of 123, normal renal function and a mild leukocytosis.  Workup was unremarkable it was suspected patient may have meningitis.infectious diseases consult at but then patient started having decreased movement in all 4 extremities and emergent MRI scan of the cervical spine was obtained. This showed disc herniation from C4 through 6 with spinal cord compression at both levels and patient was transferred from [facility] to [facility] and taken to the operating room by neurosurgery for anterior cervical discectomy with fusion and plating.  Post procedure, patient had minimal movement of his left side and mild on his right. Blood cultures ended up growing out gram-positive cocci in pairs in both bottles which grew out MSSA.it was suspected patient made a possible discitis with epidural abscess which led to his spinal cord compression. patient at this point was doing little bit better and discharged out of the ICU.  At this point he had been on Ancef for several days and infectious disease wwas recommending getting a TEE. This was attempted on 6/6, but failed due to multiple attempts unable to pass the TEE into the esophagus by both cardiology and gastroenterology. In discussion with cardiology, they felt that the scope was unable to pass because of osteophytes. They recommended possible correlation with anesthesia for direct visualization and placement of TEE probe.  On 6/7, patient was noted to have dramatic increase in his creatinine and minimal urine output. He also looked to be having respiratory decompensation issues and he was transferred back to the critical care service in the ICU. Patient through workup  was found to have postobstructive uropathy and finally a Foley catheter was able to be placed. Patient immediately voided a large amount of urine and over the next few days has had dramatic urine output and rapid improvement to normal renal  function. It was felt his respiratory issues were secondary to volume overloading because of inability to void.  Patient was then transferred back to the hospitalist service on 6/9. At this point now his renal function is normalized. Patient is now afebrile. WBC decreasing. Will place PICC line and will discharge today if it is ok with neurosurgery.   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 Long-term plan patient will need inpatient rehabilitation. Will increase OxyContin to 40 mg bid. Needs 6 to 8 weeks of antibiotics, counting from 6-10. He will need to follow up with [facility] infectious diseases and [facility] neurosurgery. He will need to work with PT for further recovery.   2-Bacteremia due to Staphylococcus  Continue on Ancef. Repeat blood cultures no growth. Have tried to get TEE but as noted in interim summary, this has been difficult. Appreciate infectious disease. He also recommended treating 6/10 as the first day of antibiotics (even though he has been on antibiotics before this) and continuing until July 26. They recommended for him to follow-up with infectious disease said after discharge and at the end of therapy after the full 6-8 weeks. ECHO no vegetation identified.  Antibiotics day 4/6 -8 weeks.  3-Peripheral neuropathy  He has had this for a very long time prior to this admission. Continue with Neurontin 300 mg by mouth 3 times a day. Adjust accordingly over the next few weeks. Watch for sedation.  4-Acute renal failure: Almost fully resolved secondary to obstructive uropathy. Leave Foley catheter in for now. possible this could be neurogenic. Creatine Peak to 5. If patient need repeated scan will need to be cautious with contrast. I spoke with urology recommend to keep foley, start Flomax and follow up outpatient. Needs to follow up with [facility].  5-Leukocytosis: UA negative,  will drawn blood culture if spike again. chest x ray negative. WBC decrease to 13.  6-Hyponatremia; discontinue HCTZ. Resolved.  7-Acute respiratory failure More for volume overload which has since improved. Resolved.      Discharge Exam: Filed Vitals:   10/31/11 0500  BP: 110/69  Pulse: 82  Temp: 98.5 F (36.9 C)  Resp: 18   Filed Vitals:   10/30/11 1400 10/30/11 1737 10/30/11 2052 10/31/11 0500  BP: 118/62 104/60 120/74 110/69  Pulse: 80 86 88 82  Temp: 98 F (36.7 C) 98 F (36.7 C) 99.2 F (37.3 C) 98.5 F (36.9 C)  TempSrc: Oral Oral Oral Oral  Resp: 20 20 20 18   Height:      Weight:   96.4 kg (212 lb 8.4 oz)   SpO2: 96% 96% 92% 96%   General: No distress. Cardiovascular: S1, S2 RRR Respiratory: CTA. Neuro: Left side hemiplegia, 1/5 right side 4-5/5  Discharge Instructions  Discharge Orders    Future Appointments: Provider: Department: Dept Phone: Center:   11/11/2011 4:00 PM Cliffton Asters, MD [facility] [phone] [facility]   12/08/2011 2:00 PM Charles Munson, MD [facility] [phone] [facility]     Future Orders Please Complete By Expires   Diet - low sodium heart healthy      Continue foley catheter      Increase activity slowly  Medication List  As of 10/31/2011  8:24 AM   STOP taking these medications         hydrochlorothiazide 25 MG tablet         TAKE these medications         ALPRAZolam 0.5 MG tablet   Commonly known as: XANAX   Take 1 tablet (0.5 mg total) by mouth 3 (three) times daily as needed for anxiety.      ceFAZolin 2-3 GM-% Solr   Commonly known as: ANCEF   Inject 50 mLs (2 g total) into the vein every 8 (eight) hours.      DSS 100 MG Caps   Take 100 mg by mouth 2 (two) times daily.      gabapentin 300 MG capsule   Commonly known as: NEURONTIN   Take 1 capsule (300 mg total) by mouth 3 (three) times daily.      insulin aspart 100 UNIT/ML injection   Commonly known as: novoLOG   Inject 4 Units into the  skin 3 (three) times daily with meals.      insulin glargine 100 UNIT/ML injection   Commonly known as: LANTUS   Inject 5 Units into the skin at bedtime.      oxyCODONE 15 MG immediate release tablet   Commonly known as: ROXICODONE   Take 1 tablet (15 mg total) by mouth every 4 (four) hours as needed (every 4 to 6 hours prn).      oxyCODONE 40 MG 12 hr tablet   Commonly known as: OXYCONTIN   Take 1 tablet (40 mg total) by mouth every 12 (twelve) hours.      polyethylene glycol packet   Commonly known as: MIRALAX / GLYCOLAX   Take 17 g by mouth 2 (two) times daily.      senna-docusate 8.6-50 MG per tablet   Commonly known as: Senokot-S   Take 2 tablets by mouth 2 (two) times daily.      Tamsulosin HCl 0.4 MG Caps   Commonly known as: FLOMAX   Take 1 capsule (0.4 mg total) by mouth daily.              The results of significant diagnostics from this hospitalization (including imaging, microbiology, ancillary and laboratory) are listed below for reference.    Significant Diagnostic Studies: Dg Chest 2 View  10/29/2011  *RADIOLOGY REPORT*  Clinical Data: Fever and shortness of breath  CHEST - 2 VIEW  Comparison: 10/25/2011  Findings: Heart size is normal.  No pleural effusion or edema.  No airspace consolidation.  Coarsened interstitial markings appear similar to previous exam.  IMPRESSION:  1.  No acute cardiopulmonary abnormalities.  Original Report Authenticated By: Rosealee Albee, M.D.   Dg Cervical Spine 2-3 Views  10/20/2011  *RADIOLOGY REPORT*  Clinical Data: Cervical fusion.  CERVICAL SPINE - 2-3 VIEW  Comparison: MRI cervical spine 10/19/2011.  Findings: Lateral cervical spine films demonstrate anterior plate and screws and interbody bone plug fusing C4-5 and C5-6.  No complicating features are demonstrated.  IMPRESSION: Anterior and interbody fusion changes at C4-5 and C5-6.  Original Report Authenticated By: P. Loralie Champagne, M.D.   Ct Head Wo Contrast  10/18/2011   *RADIOLOGY REPORT*  Clinical Data: 61 year old male found down.  Weakness.  CT HEAD WITHOUT CONTRAST  Technique:  Contiguous axial images were obtained from the base of the skull through the vertex without contrast.  Comparison: None.  Findings: Mixed layering and bubbly opacity in the right maxillary  sinus.  Other Visualized paranasal sinuses and mastoids are clear. No acute osseous abnormality identified.  Visualized orbits and scalp soft tissues are within normal limits.  Calcified atherosclerosis at the skull base.  No suspicious intracranial vascular hyperdensity.  No ventriculomegaly. No midline shift, mass effect, or evidence of mass lesion.  No acute intracranial hemorrhage identified.  No evidence of cortically based acute infarction identified.  IMPRESSION: 1.  Normal for age noncontrast CT appearance of the brain. 2.  Right maxillary sinusitis.  Original Report Authenticated By: Harley Hallmark, M.D.   Mr Laqueta Jean Wo Contrast  10/19/2011  **ADDENDUM** CREATED: 10/19/2011 17:45:12  These results were called by telephone on 10/19/2011  at  05:45 p.m. to  Judeth Cornfield, RN, ICU, who verbally acknowledged these results.  **END ADDENDUM** SIGNED BY: Chauncey Fischer, M.D.   10/19/2011  *RADIOLOGY REPORT*  Clinical Data:  Left-sided weakness.  Status post fall lethargy. Evaluate for epidural abscess.  MRI HEAD WITHOUT AND WITH CONTRAST MRI CERVICAL AND THORACIC SPINE WITHOUT AND WITH CONTRAST  Technique:  Multiplanar, multiecho pulse sequences of the brain and surrounding structures, the cervical spine, to include the craniocervical junction and cervicothoracic junction, and the thoracic spine, were obtained without and with intravenous contrast.  Contrast: 20mL MULTIHANCE GADOBENATE DIMEGLUMINE 529 MG/ML IV SOLN  Comparison:  CT head without contrast is 10/19/2011.  MRI HEAD  Findings:  The diffusion weighted images demonstrate no evidence for acute or subacute infarction.  Scattered periventricular and  subcortical T2 and FLAIR hyperintensities are greater than expected for age.  There is abnormal signal in the CSF on the FLAIR images. No pathologic enhancement is evident on the postcontrast images.  Flow is present in the major intracranial arteries.  The globes orbits are intact.  A fluid level is present in the right maxillary sinus.  The paranasal sinuses are otherwise clear.  Minimal fluid is present in the right mastoid air cells.  There is fluid in the nasopharynx.  The patient is intubated.  IMPRESSION:  1.  Incomplete CSF saturation over the convexities bilaterally. This may be related to the patient's recent lumbar puncture or possibly due to oxygenation.  Abnormal CSF is also considered. Elevated white cells were noted on the recent lumbar puncture. 2.  Scattered periventricular and subcortical white matter disease is advanced for age. The finding is nonspecific but can be seen in the setting of chronic microvascular ischemia, a demyelinating process such as multiple sclerosis, vasculitis, complicated migraine headaches, or as the sequelae of a prior infectious or inflammatory process. 3.  Mild sinus disease is potentially related to intubation.  MRI CERVICAL SPINE  Findings:  Normal signal is present in the cervical spine.  The craniocervical junction is within normal limits.  Flow is present in the major vascular structures of the neck.  C2-3:  Minimal disc bulging is present.  There is no significant stenosis.  C3-4:  A leftward disc osteophyte complex effaces the ventral CSF. Mild left foraminal narrowing is secondary to disc and uncovertebral spurring.  C4-5:  A diffuse disc herniation is present.  There is superior and inferior extrusion of disc material extending 5 mm above and 6 mm below the disc level.  Moderate central canal stenosis is present. The canal is narrowed to 6 mm.  Moderate foraminal stenosis is worse on the left.  C5-6:  A broad-based disc herniation is present.  There is  flattening of the ventral cord.  The canal is narrowed to 8 mm. Moderate to severe  foraminal stenosis is present bilaterally.  C6-7:  No significant disc herniation or stenosis is present.  C7-T1:  No significant disc herniation or stenosis is present.  Diffuse dural enhancement is present throughout the cervical spine. There is enhancement around what is presumed to be disc at C4-5 and at C5-6.  There is some enhancement within the disc space at C5-6. No definite marrow enhancement is present. There is some enhancement of the anterior paraspinous musculature on the left at to C3-4, C4-5, and C5-6.  IMPRESSION: 1.  Diffuse dural enhancement throughout the cervical spine.  This could be related to infection, potentially from the C5-6 disc space.  Some dural enhancement can be seen normally following lumbar puncture. 2.  Large disc herniation with superior and inferior exterior extrusion at C4-5.  Enhancement surrounds the disc without a definite abscess. 3.  Broad-based disc herniation with some internal enhancement of the disc at C5-6. Infection is not excluded. 4.  Left paraspinous muscle enhancement also raises suspicion for infection. 5.  Disc bulging at C3-4 as well as uncovertebral spurring leads to mild left foraminal narrowing. 5.  Moderate central canal stenosis at C4-5 greater than C5-6. 6.  Moderate foraminal stenosis bilaterally at C4-5. 7.  Moderate to severe foraminal stenosis bilaterally at C5-6.  MRI THORACIC SPINE  Findings: The thoracic spinal cord is within normal limits.  The conus medullaris appears to be terminating at L1 but is incompletely imaged.  The postcontrast images demonstrate some posterior dural enhancement to the level of T4.  There is mild dural enhancement at this T11 and T12 as well.  There is no abscess.  A superior endplate Schmorl's node and remote compression fracture is present at T12.  There is focal thickening of the ligamentum flavum on the left at T10-11 with slight  encroachment on the CSF but no significant stenosis.  The foramina are patent bilaterally.  The postcontrast images are otherwise unremarkable.  Mild dependent atelectasis is present in the lungs bilaterally. The patient is intubated.  An NG tube is in place.  The soft tissues are otherwise unremarkable.  IMPRESSION:  1.  Mild dural enhancement without evidence for an abscess.  This is likely related to dural infection.  Such findings could be seen following recent lumbar puncture. 2.  Minimal posterior encroachment by thickening of the ligamentum flavum on the left at T10-11. Original Report Authenticated By: Jamesetta Orleans. MATTERN, M.D.   Mr Cervical Spine W Wo Contrast  10/19/2011  **ADDENDUM** CREATED: 10/19/2011 17:45:12  These results were called by telephone on 10/19/2011  at  05:45 p.m. to  Judeth Cornfield, RN, ICU, who verbally acknowledged these results.  **END ADDENDUM** SIGNED BY: Chauncey Fischer, M.D.   10/19/2011  *RADIOLOGY REPORT*  Clinical Data:  Left-sided weakness.  Status post fall lethargy. Evaluate for epidural abscess.  MRI HEAD WITHOUT AND WITH CONTRAST MRI CERVICAL AND THORACIC SPINE WITHOUT AND WITH CONTRAST  Technique:  Multiplanar, multiecho pulse sequences of the brain and surrounding structures, the cervical spine, to include the craniocervical junction and cervicothoracic junction, and the thoracic spine, were obtained without and with intravenous contrast.  Contrast: 20mL MULTIHANCE GADOBENATE DIMEGLUMINE 529 MG/ML IV SOLN  Comparison:  CT head without contrast is 10/19/2011.  MRI HEAD  Findings:  The diffusion weighted images demonstrate no evidence for acute or subacute infarction.  Scattered periventricular and subcortical T2 and FLAIR hyperintensities are greater than expected for age.  There is abnormal signal in the CSF on the FLAIR images. No pathologic  enhancement is evident on the postcontrast images.  Flow is present in the major intracranial arteries.  The globes orbits  are intact.  A fluid level is present in the right maxillary sinus.  The paranasal sinuses are otherwise clear.  Minimal fluid is present in the right mastoid air cells.  There is fluid in the nasopharynx.  The patient is intubated.  IMPRESSION:  1.  Incomplete CSF saturation over the convexities bilaterally. This may be related to the patient's recent lumbar puncture or possibly due to oxygenation.  Abnormal CSF is also considered. Elevated white cells were noted on the recent lumbar puncture. 2.  Scattered periventricular and subcortical white matter disease is advanced for age. The finding is nonspecific but can be seen in the setting of chronic microvascular ischemia, a demyelinating process such as multiple sclerosis, vasculitis, complicated migraine headaches, or as the sequelae of a prior infectious or inflammatory process. 3.  Mild sinus disease is potentially related to intubation.  MRI CERVICAL SPINE  Findings:  Normal signal is present in the cervical spine.  The craniocervical junction is within normal limits.  Flow is present in the major vascular structures of the neck.  C2-3:  Minimal disc bulging is present.  There is no significant stenosis.  C3-4:  A leftward disc osteophyte complex effaces the ventral CSF. Mild left foraminal narrowing is secondary to disc and uncovertebral spurring.  C4-5:  A diffuse disc herniation is present.  There is superior and inferior extrusion of disc material extending 5 mm above and 6 mm below the disc level.  Moderate central canal stenosis is present. The canal is narrowed to 6 mm.  Moderate foraminal stenosis is worse on the left.  C5-6:  A broad-based disc herniation is present.  There is flattening of the ventral cord.  The canal is narrowed to 8 mm. Moderate to severe foraminal stenosis is present bilaterally.  C6-7:  No significant disc herniation or stenosis is present.  C7-T1:  No significant disc herniation or stenosis is present.  Diffuse dural enhancement  is present throughout the cervical spine. There is enhancement around what is presumed to be disc at C4-5 and at C5-6.  There is some enhancement within the disc space at C5-6. No definite marrow enhancement is present. There is some enhancement of the anterior paraspinous musculature on the left at to C3-4, C4-5, and C5-6.  IMPRESSION: 1.  Diffuse dural enhancement throughout the cervical spine.  This could be related to infection, potentially from the C5-6 disc space.  Some dural enhancement can be seen normally following lumbar puncture. 2.  Large disc herniation with superior and inferior exterior extrusion at C4-5.  Enhancement surrounds the disc without a definite abscess. 3.  Broad-based disc herniation with some internal enhancement of the disc at C5-6. Infection is not excluded. 4.  Left paraspinous muscle enhancement also raises suspicion for infection. 5.  Disc bulging at C3-4 as well as uncovertebral spurring leads to mild left foraminal narrowing. 5.  Moderate central canal stenosis at C4-5 greater than C5-6. 6.  Moderate foraminal stenosis bilaterally at C4-5. 7.  Moderate to severe foraminal stenosis bilaterally at C5-6.  MRI THORACIC SPINE  Findings: The thoracic spinal cord is within normal limits.  The conus medullaris appears to be terminating at L1 but is incompletely imaged.  The postcontrast images demonstrate some posterior dural enhancement to the level of T4.  There is mild dural enhancement at this T11 and T12 as well.  There is no abscess.  A superior endplate Schmorl's node and remote compression fracture is present at T12.  There is focal thickening of the ligamentum flavum on the left at T10-11 with slight encroachment on the CSF but no significant stenosis.  The foramina are patent bilaterally.  The postcontrast images are otherwise unremarkable.  Mild dependent atelectasis is present in the lungs bilaterally. The patient is intubated.  An NG tube is in place.  The soft tissues are  otherwise unremarkable.  IMPRESSION:  1.  Mild dural enhancement without evidence for an abscess.  This is likely related to dural infection.  Such findings could be seen following recent lumbar puncture. 2.  Minimal posterior encroachment by thickening of the ligamentum flavum on the left at T10-11. Original Report Authenticated By: Jamesetta Orleans. MATTERN, M.D.   Mr Thoracic Spine W Wo Contrast  10/19/2011  **ADDENDUM** CREATED: 10/19/2011 17:45:12  These results were called by telephone on 10/19/2011  at  05:45 p.m. to  Judeth Cornfield, RN, ICU, who verbally acknowledged these results.  **END ADDENDUM** SIGNED BY: Chauncey Fischer, M.D.   10/19/2011  *RADIOLOGY REPORT*  Clinical Data:  Left-sided weakness.  Status post fall lethargy. Evaluate for epidural abscess.  MRI HEAD WITHOUT AND WITH CONTRAST MRI CERVICAL AND THORACIC SPINE WITHOUT AND WITH CONTRAST  Technique:  Multiplanar, multiecho pulse sequences of the brain and surrounding structures, the cervical spine, to include the craniocervical junction and cervicothoracic junction, and the thoracic spine, were obtained without and with intravenous contrast.  Contrast: 20mL MULTIHANCE GADOBENATE DIMEGLUMINE 529 MG/ML IV SOLN  Comparison:  CT head without contrast is 10/19/2011.  MRI HEAD  Findings:  The diffusion weighted images demonstrate no evidence for acute or subacute infarction.  Scattered periventricular and subcortical T2 and FLAIR hyperintensities are greater than expected for age.  There is abnormal signal in the CSF on the FLAIR images. No pathologic enhancement is evident on the postcontrast images.  Flow is present in the major intracranial arteries.  The globes orbits are intact.  A fluid level is present in the right maxillary sinus.  The paranasal sinuses are otherwise clear.  Minimal fluid is present in the right mastoid air cells.  There is fluid in the nasopharynx.  The patient is intubated.  IMPRESSION:  1.  Incomplete CSF saturation over  the convexities bilaterally. This may be related to the patient's recent lumbar puncture or possibly due to oxygenation.  Abnormal CSF is also considered. Elevated white cells were noted on the recent lumbar puncture. 2.  Scattered periventricular and subcortical white matter disease is advanced for age. The finding is nonspecific but can be seen in the setting of chronic microvascular ischemia, a demyelinating process such as multiple sclerosis, vasculitis, complicated migraine headaches, or as the sequelae of a prior infectious or inflammatory process. 3.  Mild sinus disease is potentially related to intubation.  MRI CERVICAL SPINE  Findings:  Normal signal is present in the cervical spine.  The craniocervical junction is within normal limits.  Flow is present in the major vascular structures of the neck.  C2-3:  Minimal disc bulging is present.  There is no significant stenosis.  C3-4:  A leftward disc osteophyte complex effaces the ventral CSF. Mild left foraminal narrowing is secondary to disc and uncovertebral spurring.  C4-5:  A diffuse disc herniation is present.  There is superior and inferior extrusion of disc material extending 5 mm above and 6 mm below the disc level.  Moderate central canal stenosis is present. The canal is narrowed  to 6 mm.  Moderate foraminal stenosis is worse on the left.  C5-6:  A broad-based disc herniation is present.  There is flattening of the ventral cord.  The canal is narrowed to 8 mm. Moderate to severe foraminal stenosis is present bilaterally.  C6-7:  No significant disc herniation or stenosis is present.  C7-T1:  No significant disc herniation or stenosis is present.  Diffuse dural enhancement is present throughout the cervical spine. There is enhancement around what is presumed to be disc at C4-5 and at C5-6.  There is some enhancement within the disc space at C5-6. No definite marrow enhancement is present. There is some enhancement of the anterior paraspinous  musculature on the left at to C3-4, C4-5, and C5-6.  IMPRESSION: 1.  Diffuse dural enhancement throughout the cervical spine.  This could be related to infection, potentially from the C5-6 disc space.  Some dural enhancement can be seen normally following lumbar puncture. 2.  Large disc herniation with superior and inferior exterior extrusion at C4-5.  Enhancement surrounds the disc without a definite abscess. 3.  Broad-based disc herniation with some internal enhancement of the disc at C5-6. Infection is not excluded. 4.  Left paraspinous muscle enhancement also raises suspicion for infection. 5.  Disc bulging at C3-4 as well as uncovertebral spurring leads to mild left foraminal narrowing. 5.  Moderate central canal stenosis at C4-5 greater than C5-6. 6.  Moderate foraminal stenosis bilaterally at C4-5. 7.  Moderate to severe foraminal stenosis bilaterally at C5-6.  MRI THORACIC SPINE  Findings: The thoracic spinal cord is within normal limits.  The conus medullaris appears to be terminating at L1 but is incompletely imaged.  The postcontrast images demonstrate some posterior dural enhancement to the level of T4.  There is mild dural enhancement at this T11 and T12 as well.  There is no abscess.  A superior endplate Schmorl's node and remote compression fracture is present at T12.  There is focal thickening of the ligamentum flavum on the left at T10-11 with slight encroachment on the CSF but no significant stenosis.  The foramina are patent bilaterally.  The postcontrast images are otherwise unremarkable.  Mild dependent atelectasis is present in the lungs bilaterally. The patient is intubated.  An NG tube is in place.  The soft tissues are otherwise unremarkable.  IMPRESSION:  1.  Mild dural enhancement without evidence for an abscess.  This is likely related to dural infection.  Such findings could be seen following recent lumbar puncture. 2.  Minimal posterior encroachment by thickening of the ligamentum  flavum on the left at T10-11. Original Report Authenticated By: Jamesetta Orleans. MATTERN, M.D.   US Renal Port  10/24/2011  *RADIOLOGY REPORT*  Clinical Data: Renal insufficiency.  Question hydronephrosis.  RENAL/URINARY TRACT ULTRASOUND COMPLETE - PORTABLE  Comparison:  None.  Findings:  Right Kidney:  No hydronephrosis.  Well-preserved cortex.  Normal size and parenchymal echotexture without focal abnormalities. Renal length 11.8 cm.  Left Kidney:  The left kidney is poorly visualized due to bowel gas.  The patient was unable to position for better imaging of the left kidney.  Its estimated length is approximately 10.3 cm.  There is no gross hydronephrosis or other abnormality.  Bladder:  Decompressed by a Foley catheter.  IMPRESSION: No evidence of hydronephrosis.  Please note the left kidney is poorly visualized on this portable examination.  Original Report Authenticated By: Gerrianne Scale, M.D.   Dg Chest Port 1 View  10/25/2011  *RADIOLOGY  REPORT*  Clinical Data: Respiratory failure.  PORTABLE CHEST - 1 VIEW  Comparison: 10/24/2011  Findings: Single view of the chest was obtained.  Prominent interstitial markings may represent vascular congestion. There may be improved aeration in the left lung.  Stable densities at the left costophrenic angle.  Trachea is midline.  IMPRESSION: Slightly improved aeration in the left lung with prominent interstitial markings.  Findings could represent mild edema.  Persistent densities at the left costophrenic angle are indeterminate.  Original Report Authenticated By: Richarda Overlie, M.D.   Dg Chest Port 1 View  10/24/2011  *RADIOLOGY REPORT*  Clinical Data: Chest congestion  PORTABLE CHEST - 1 VIEW  Comparison: 10/23/2011  Findings: The lung volumes are suboptimally inflated.  Persistent opacity within the left base noted which may be due to atelectasis and/or consolidation.  Pulmonary vascular congestion appears increased from previous exam.  IMPRESSION:  1.  Persistent  low lung volumes. 2.  Pulmonary vascular congestion. 3.  Persistent diminished aeration to the left base  Original Report Authenticated By: Rosealee Albee, M.D.   Dg Chest Port 1 View  10/21/2011  *RADIOLOGY REPORT*  Clinical Data: Postop cervical spine surgery.  PORTABLE CHEST - 1 VIEW  Comparison: 10/20/2011.  Findings: The heart is mildly enlarged but stable.  The endotracheal tube and NG tubes have been removed.  Low lung volumes with increase and vascular crowding atelectasis.  Probable mild vascular congestion.  The  IMPRESSION:  1.  Low lung volumes post extubation with increase and vascular crowding and atelectasis. 2.  Mild vascular congestion.  Original Report Authenticated By: P. Loralie Champagne, M.D.   Dg Chest Port 1 View  10/20/2011  *RADIOLOGY REPORT*  Clinical Data: Evaluate endotracheal tube position  PORTABLE CHEST - 1 VIEW  Comparison: 10/17/1936  Findings: Endotracheal tube terminates 4.0 cm above carina. Nasogastric tube extends beyond the  inferior aspect of the film. Mild cardiomegaly.  Right hemidiaphragm elevation is mild. No pleural effusion or pneumothorax.  No congestive failure.  Minimal right base subsegmental atelectasis.  Improved left base aeration.  IMPRESSION: Improved bibasilar atelectasis. No acute findings.  Original Report Authenticated By: Consuello Bossier, M.D.   Dg Chest Port 1 View  10/18/2011  *RADIOLOGY REPORT*  Clinical Data: Evaluate endotracheal tube.  Hypertension.  PORTABLE CHEST - 1 VIEW  Comparison: 10/18/2011 at the 1953 hours  Findings: Interval placement of endotracheal tube with tip about 4.8 cm above the carina.  An enteric tube has been placed.  The tip is not visualized but is below the left hemidiaphragm consistent with location at least in the stomach.  Shallow inspiration.  Mild cardiac enlargement with suggestion of mild pulmonary vascular congestion.  Linear atelectasis in the lung bases.  No pneumothorax.  No blunting of costophrenic angles.   IMPRESSION: Endotracheal tube placed with tip about 4.8 cm above the carina.  Original Report Authenticated By: Marlon Pel, M.D.   Dg Chest Port 1 View  10/18/2011  *RADIOLOGY REPORT*  Clinical Data: 61 year old male with hypertension and altered mental status.  PORTABLE CHEST - 1 VIEW  Comparison: None.  Findings: Upright AP view 1953 hours.  Low lung volumes.  Right greater than left streaky bibasilar opacity.  No pneumothorax, pulmonary edema or definite effusion. There is cardiomegaly.  Other mediastinal contours are within normal limits.  Visualized tracheal air column is within normal limits.  IMPRESSION: Low lung volumes with right greater than left basilar opacity. Favor atelectasis but lung base infection cannot be excluded.  Original  Report Authenticated By: Harley Hallmark, M.D.   Dg Chest Port 1v Same Day  10/23/2011  *RADIOLOGY REPORT*  Clinical Data: Chest congestion and wheezing.  PORTABLE CHEST - 1 VIEW SAME DAY  Comparison: Chest x-ray 10/21/2011.  Findings: Lung volumes are low.  There is a left basilar opacity obscuring the left hemidiaphragm and blunting of the left costophrenic sulcus.  Right lung appears clear.  There is pulmonary venous congestion without frank pulmonary edema.  Heart size is mildly enlarged (unchanged). The patient is rotated to the left on today's exam, resulting in distortion of the mediastinal contours and reduced diagnostic sensitivity and specificity for mediastinal pathology.  Atherosclerotic calcifications within the thoracic aorta.  Orthopedic fixation hardware incompletely visualized in the lower cervical spine.  IMPRESSION: 1.  Low lung volumes with atelectasis and/or consolidation in the left lower lobe, with superimposed small left-sided pleural effusion. 2.  Mild cardiomegaly with pulmonary venous congestion. 3.  Atherosclerosis  Original Report Authenticated By: Florencia Reasons, M.D.   Dg Swallowing Func-no Report  10/25/2011  CLINICAL DATA:  possible aspiration   FLUOROSCOPY FOR SWALLOWING FUNCTION STUDY:  Fluoroscopy was provided for swallowing function study, which was  administered by a speech pathologist.  Final results and recommendations  from this study are contained within the speech pathology report.      Microbiology: Recent Results (from the past 240 hour(s))  MRSA PCR SCREENING     Status: Normal   Collection Time   10/24/11  2:42 PM      Component Value Range Status Comment   MRSA by PCR NEGATIVE  NEGATIVE Final   URINE CULTURE     Status: Normal   Collection Time   10/29/11  5:14 PM      Component Value Range Status Comment   Specimen Description URINE, CATHETERIZED   Final    Special Requests NONE   Final    Culture  Setup Time [id number]   Final    Colony Count NO GROWTH   Final    Culture NO GROWTH   Final    Report Status 10/31/2011 FINAL   Final      Labs: Basic Metabolic Panel:  Lab 10/31/11 2956 10/29/11 0500 10/27/11 0637 10/26/11 0610 10/25/11 0405  NA 135 133* 134* 138 135  K 4.8 4.1 -- -- --  CL 99 97 97 100 98  CO2 27 27 27 29 22   GLUCOSE 124* 115* 105* 145* 133*  BUN 20 23 27* 34* 59*  CREATININE 0.69 0.71 0.68 0.73 1.69*  CALCIUM 8.8 8.7 8.3* 8.4 8.8  MG -- -- -- 2.5 2.6*  PHOS -- -- -- 2.9 3.9   Liver Function Tests:  Lab 10/24/11 1100  AST 18  ALT <5  ALKPHOS 50  BILITOT 0.3  PROT 5.6*  ALBUMIN 1.9*   CBC:  Lab 10/31/11 0640 10/30/11 0515 10/29/11 1422 10/29/11 0500 10/27/11 0637 10/26/11 0610  WBC 13.6* 19.1* -- 20.5* 16.7* 17.2*  NEUTROABS -- 14.8* 17.6* -- -- --  HGB 13.2 12.6* -- 13.3 12.6* 13.4  HCT 39.7 37.0* -- 40.4 38.5* 39.2  MCV 88.4 87.9 -- 88.6 89.1 87.1  PLT 218 260 -- 253 241 268   Cardiac Enzymes: No results found for this basename: CKTOTAL:5,CKMB:5,CKMBINDEX:5,TROPONINI:5 in the last 168 hours BNP: No components found with this basename: POCBNP:5 CBG:  Lab 10/31/11 0733 10/30/11 2057 10/30/11 1657 10/30/11 1152 10/30/11 0737  GLUCAP 105* 95 130*  125* 103*    Time coordinating discharge: 30  minutes  Signed:  Kin Galbraith  [facility] 10/31/2011, 8:24 AM

## 2011-10-31 NOTE — Progress Notes (Signed)
Pt awaiting transport to [facility]. Pt shows no barriers to discharge. Assessment unchanged from morning. IV removed. PICC line staying in place for long term antibiotics. Foley staying in place due to neurogenic bladder. Called report to [facility] nurse.

## 2011-10-31 NOTE — Progress Notes (Signed)
Speech Language Pathology Dysphagia Treatment Patient Details Name: Charles Ball MRN: [id number] DOB: [birth year] Today's Date: 10/31/2011 Time: 4098-1191 SLP Time Calculation (min): 8 min  Assessment / Plan / Recommendation Clinical Impression  Pt. seen for dysphagia therapy.  HOB reclined and pt. refused to have bed raised due to pain level after SLP explained clinical reasoning for an upright position.  He stated "It took me a long time get comfortable."  Observed pt. taking pills with RN with thin sips via straw (taking pain meds for leg pain).  No s/s aspiration.  Pt. reports no difficulty masticating Dys 3 diet and reports he has been receiving adequate amounts.  Explained difference between the Dys 3 diet and regular texture.  He reported he would like to try regular texture and does not feel he will have difficulties with masticaiton and oral transit.   Possible discharge soon.  If pt. still her Monday will f/u for tolerance with regular diet texture.  Recommend f/u at next venue of care.     Diet Recommendation  Initiate / Change Diet: Regular;Thin liquid    SLP Plan Continue with current plan of care      Swallowing Goals  SLP Swallowing Goals Patient will consume recommended diet without observed clinical signs of aspiration with: Moderate assistance Swallow Study Goal #1 - Progress: Progressing toward goal Patient will utilize recommended strategies during swallow to increase swallowing safety with: Moderate assistance Swallow Study Goal #2 - Progress: Progressing toward goal  General Temperature Spikes Noted: No Respiratory Status: Room air Behavior/Cognition: Alert;Cooperative Oral Cavity - Dentition: Poor condition;Missing dentition Patient Positioning: Upright in bed  Oral Cavity - Oral Hygiene Does patient have any of the following "at risk" factors?: None of the above Brush patient's teeth BID with toothbrush (using toothpaste with fluoride): Yes   Dysphagia  Treatment Treatment focused on: Skilled observation of diet tolerance Treatment Methods/Modalities: Skilled observation Patient observed directly with PO's: Yes Type of PO's observed: Thin liquids (MEDS GIVEN BY RN WITH THIN) Feeding: Needs assist Liquids provided via: Straw Type of cueing: Verbal Amount of cueing: Minimal   [name] M.Ed [employer] [phone]  10/31/2011

## 2011-11-11 ENCOUNTER — Ambulatory Visit (INDEPENDENT_AMBULATORY_CARE_PROVIDER_SITE_OTHER): Payer: 59 | Admitting: Internal Medicine

## 2011-11-11 ENCOUNTER — Encounter: Payer: Self-pay | Admitting: Internal Medicine

## 2011-11-11 VITALS — BP 127/78 | HR 91 | Temp 99.1°F

## 2011-11-11 DIAGNOSIS — R7881 Bacteremia: Secondary | ICD-10-CM

## 2011-11-11 DIAGNOSIS — G062 Extradural and subdural abscess, unspecified: Secondary | ICD-10-CM

## 2011-11-11 DIAGNOSIS — B958 Unspecified staphylococcus as the cause of diseases classified elsewhere: Secondary | ICD-10-CM

## 2011-11-11 NOTE — Progress Notes (Signed)
Patient ID: Charles Ball, male   DOB: 1950-08-22, 61 y.o.   MRN: [id number]    [facility]  Patient Active Problem List  Diagnosis  . HYPERTENSION  . Acute respiratory failure  . Cervical (neck) region somatic dysfunction  . Epidural abscess  . Thrombocytopenia  . Hyperglycemia  . Bacteremia due to Staphylococcus  . Peripheral neuropathy    Patient's Medications  New Prescriptions   No medications on file  Previous Medications   ALPRAZOLAM (XANAX) 0.5 MG TABLET    Take 1 tablet (0.5 mg total) by mouth 3 (three) times daily as needed for anxiety.   CEFAZOLIN (ANCEF) 2-3 GM-% SOLR    Inject 50 mLs (2 g total) into the vein every 8 (eight) hours.   FLUCONAZOLE (DIFLUCAN) 100 MG TABLET    Take 100 mg by mouth daily.   GABAPENTIN (NEURONTIN) 300 MG CAPSULE    Take 1 capsule (300 mg total) by mouth 3 (three) times daily.   INSULIN ASPART (NOVOLOG) 100 UNIT/ML INJECTION    Inject 4 Units into the skin 3 (three) times daily with meals.   INSULIN GLARGINE (LANTUS) 100 UNIT/ML INJECTION    Inject 5 Units into the skin at bedtime.   OXYCODONE (OXYCONTIN) 40 MG 12 HR TABLET    Take 1 tablet (40 mg total) by mouth every 12 (twelve) hours.   SENNA-DOCUSATE (SENOKOT-S) 8.6-50 MG PER TABLET    Take 2 tablets by mouth 2 (two) times daily.   TAMSULOSIN HCL (FLOMAX) 0.4 MG CAPS    Take 1 capsule (0.4 mg total) by mouth daily.   TEMAZEPAM (RESTORIL) 15 MG CAPSULE    Take 15 mg by mouth at bedtime as needed.  Modified Medications   No medications on file  Discontinued Medications   No medications on file    Subjective: Charles Ball is in for his hospital followup visit along with his wife. He was hospitalized on June 1 after being found down on the floor. He had been having severe left shoulder, arm and leg pain for several days. He was found to have a cervical epidural abscess and initial blood cultures grew MSSA. He underwent emergent anterior cervical decompression and  fusion on June 2. Operative cultures grew MSSA. Repeat blood cultures on June 3 were negative and a transthoracic echocardiogram failed to reveal any evidence of endocarditis. He was discharged to a skilled nursing facility and has been making some progress. He has not had any problems tolerating his PICC or Ancef. He has now received 25 days of antibiotic therapy and my partner, Dr. Drue Second, recommended continuing until July 26. He continues to have pain in his left shoulder but is having improvement in his strength. Specifically, his left arm strength has improved. He has been able to sit on the side of the bed but has not been able to stand yet. He recently developed a few blisters on his back and right flank. His wife states that the blisters on his back across the midline. They are not painful.  Objective: Temp: 99.1 F (37.3 C) (06/25 1608) Temp src: Oral (06/25 1608) BP: 127/78 mmHg (06/25 1608) Pulse Rate: 91  (06/25 1608)  General:  He is alert and in good spirits Skin:  His right arm PICC site shows a little bit of redness around the insertion site but otherwise is normal. He has a healing abrasion over his lower sternum that occurred prior to admission. He has a few small blisters on his  right flank that do not appear infected. Lungs:  Clear anteriorly Cor:  Regular S1 and S2 with no murmurs Abdomen:  Obese, soft and nontender He is able to raise both arms above his head. He is on an EMS stretcher     Assessment:  He has MSSA bacteremia complicated by epidural abscess and paraplegia. Fortunately he is regaining some strength. His blood cultures became negative fairly quickly after starting antibiotics and he seems to be improving slowly.  Plan: 1. Continue Ancef 2. Followup in 3 weeks   Cliffton Asters, MD [facility] [phone] pager   [phone] cell 11/11/2011, 4:30 PM

## 2011-11-12 LAB — MISCELLANEOUS TEST

## 2011-11-30 LAB — AFB CULTURE WITH SMEAR (NOT AT ARMC): Acid Fast Smear: NONE SEEN

## 2011-12-01 LAB — AFB CULTURE WITH SMEAR (NOT AT ARMC)

## 2011-12-08 ENCOUNTER — Ambulatory Visit (INDEPENDENT_AMBULATORY_CARE_PROVIDER_SITE_OTHER): Payer: MEDICAID | Admitting: Internal Medicine

## 2011-12-08 ENCOUNTER — Encounter: Payer: Self-pay | Admitting: Internal Medicine

## 2011-12-08 VITALS — BP 128/82 | HR 81 | Temp 99.1°F | Wt 219.0 lb

## 2011-12-08 DIAGNOSIS — M4640 Discitis, unspecified, site unspecified: Secondary | ICD-10-CM

## 2011-12-08 DIAGNOSIS — M519 Unspecified thoracic, thoracolumbar and lumbosacral intervertebral disc disorder: Secondary | ICD-10-CM

## 2011-12-08 NOTE — Progress Notes (Signed)
Labs drawn from right arm PICC. First 10 cc destroyed.   Dressing change performed per Dr Drue Second using sterile procedure. PICC site remarkable .  Labs drawn with no problems PICC flushed well with saline.  Pt tolerated the procedure well.   Laurell Josephs, RN

## 2011-12-09 LAB — C-REACTIVE PROTEIN: CRP: <0.5 mg/dL (ref <0.60)

## 2011-12-15 NOTE — Progress Notes (Signed)
INFECTIOUS DISEASES CLINIC  RFV: hospital follow up to MSSA bacteremia and cervical epidural abscess  Subjective:    Patient ID: Charles Ball, male    DOB: May 13, 1951, 61 y.o.   MRN: [id number]  HPI Charles Ball is a 61 yo Male who was admitted on June 1 after being found down on the floor. He had been having severe left shoulder, arm and leg pain for several days. He was found to have a cervical epidural abscess and initial blood cultures grew MSSA. He underwent emergent anterior cervical decompression and fusion on June 2. Operative cultures grew MSSA. Repeat blood cultures on June 3 were negative and a transthoracic echocardiogram failed to reveal any evidence of endocarditis. He was discharged to a skilled nursing facility for IV antibiotics and rehab. With the plan to finished 6 wks continuing until July 26.  his left arm strength has improved. He is starting to sit on the side of the bed and just starting to stand with physical therapy. No fevers/chills/nightsweats/ surgical site non tender, no erythema, no fluctuance. No problems with picc line.   Current Outpatient Prescriptions on File Prior to Visit  Medication Sig Dispense Refill  . ceFAZolin (ANCEF) 2-3 GM-% SOLR Inject 50 mLs (2 g total) into the vein every 8 (eight) hours.  4500 mL  0  . fluconazole (DIFLUCAN) 100 MG tablet Take 100 mg by mouth daily.      Marland Kitchen gabapentin (NEURONTIN) 300 MG capsule Take 1 capsule (300 mg total) by mouth 3 (three) times daily.  90 capsule  0  . insulin aspart (NOVOLOG) 100 UNIT/ML injection Inject 4 Units into the skin 3 (three) times daily with meals.  1 vial  0  . insulin glargine (LANTUS) 100 UNIT/ML injection Inject 5 Units into the skin at bedtime.  10 mL  0  . oxyCODONE (OXYCONTIN) 40 MG 12 hr tablet Take 1 tablet (40 mg total) by mouth every 12 (twelve) hours.  60 tablet  0  . senna-docusate (SENOKOT-S) 8.6-50 MG per tablet Take 2 tablets by mouth 2 (two) times daily.  30 tablet  0  . Tamsulosin  HCl (FLOMAX) 0.4 MG CAPS Take 1 capsule (0.4 mg total) by mouth daily.  30 capsule  0  . temazepam (RESTORIL) 15 MG capsule Take 15 mg by mouth at bedtime as needed.       Active Ambulatory Problems    Diagnosis Date Noted  . HYPERTENSION 10/14/2006  . Acute respiratory failure 10/19/2011  . Cervical (neck) region somatic dysfunction 10/19/2011  . Epidural abscess 10/21/2011  . Thrombocytopenia 10/21/2011  . Hyperglycemia 10/21/2011  . Bacteremia due to Staphylococcus 10/21/2011  . Peripheral neuropathy 10/26/2011   Resolved Ambulatory Problems    Diagnosis Date Noted  . Headache 10/14/2006   Past Medical History  Diagnosis Date  . Hypertension   . Insomnia secondary to chronic pain   . Generalized headaches   . Chronic pain   . Anxiety disorder    History  Substance Use Topics  . Smoking status: Never Smoker   . Smokeless tobacco: Never Used  . Alcohol Use: Yes     1 beer every three weeks  family history includes Cancer in his father, maternal grandfather, maternal grandmother, mother, paternal grandfather, and paternal grandmother; Diabetes in his brothers; and Hypertension in his brothers, father, and mother.   Review of Systems  Constitutional: Negative for fever, chills, diaphoresis, activity change, appetite change, fatigue and unexpected weight change.  HENT: Negative for congestion, sore  throat, rhinorrhea, sneezing, trouble swallowing and sinus pressure.  Eyes: Negative for photophobia and visual disturbance.  Respiratory: Negative for cough, chest tightness, shortness of breath, wheezing and stridor.  Cardiovascular: Negative for chest pain, palpitations and leg swelling.  Gastrointestinal: Negative for nausea, vomiting, abdominal pain, diarrhea, constipation, blood in stool, abdominal distention and anal bleeding.  Genitourinary: Negative for dysuria, hematuria, flank pain and difficulty urinating.  Musculoskeletal: Negative for myalgias, back pain, joint  swelling, arthralgias and gait problem.  Skin: Negative for color change, pallor, rash and wound.  Neurological: Negative for dizziness, tremors, weakness and light-headedness.  Hematological: Negative for adenopathy. Does not bruise/bleed easily.  Psychiatric/Behavioral: Negative for behavioral problems, confusion, sleep disturbance, dysphoric mood, decreased concentration and agitation.       Objective:   Physical Exam BP 128/82  Pulse 81  Temp 99.1 F (37.3 C) (Oral)  Wt 219 lb (99.338 kg) Physical Exam  Constitutional: He is oriented to person, place, and time. He appears well-developed and well-nourished. No distress.  HENT:  Mouth/Throat: Oropharynx is clear and moist. No oropharyngeal exudate.  Cardiovascular: Normal rate, regular rhythm and normal heart sounds. Exam reveals no gallop and no friction rub.  No murmur heard.  Pulmonary/Chest: Effort normal and breath sounds normal. No respiratory distress. He has no wheezes.  Abdominal: Soft. Bowel sounds are normal. He exhibits no distension. There is no tenderness.  Lymphadenopathy:  He has no cervical adenopathy.  Neurological: He is alert and oriented to person, place, and time.  Skin: Skin is warm and dry. No rash noted. No erythema.  Psychiatric: He has a normal mood and affect. His behavior is normal.   IMAGING MRI 6/2 IMPRESSION:  1. Diffuse dural enhancement throughout the cervical spine. This  could be related to infection, potentially from the C5-6 disc  space. Some dural enhancement can be seen normally following  lumbar puncture.  2. Large disc herniation with superior and inferior exterior  extrusion at C4-5. Enhancement surrounds the disc without a  definite abscess.  3. Broad-based disc herniation with some internal enhancement of  the disc at C5-6. Infection is not excluded.  4. Left paraspinous muscle enhancement also raises suspicion for  infection.  5. Disc bulging at C3-4 as well as uncovertebral  spurring leads to  mild left foraminal narrowing.  5. Moderate central canal stenosis at C4-5 greater than C5-6.  6. Moderate foraminal stenosis bilaterally at C4-5.  7. Moderate to severe foraminal stenosis bilaterally at C5-6.  MRI THORACIC SPINE  Findings: The thoracic spinal cord is within normal limits. The  conus medullaris appears to be terminating at L1 but is  incompletely imaged.  The postcontrast images demonstrate some posterior dural  enhancement to the level of T4. There is mild dural enhancement at  this T11 and T12 as well. There is no abscess.  A superior endplate Schmorl's node and remote compression fracture  is present at T12. There is focal thickening of the ligamentum  flavum on the left at T10-11 with slight encroachment on the CSF  but no significant stenosis. The foramina are patent bilaterally.  The postcontrast images are otherwise unremarkable.  Mild dependent atelectasis is present in the lungs bilaterally.  The patient is intubated. An NG tube is in place. The soft  tissues are otherwise unremarkable.  IMPRESSION:  1. Mild dural enhancement without evidence for an abscess. This  is likely related to dural infection. Such findings could be seen  following recent lumbar puncture.  2. Minimal posterior encroachment  by thickening of the ligamentum  flavum on the left at T10-11.  MICRO: 6/1 blood cx x 2 MSSA 6/2 cervical disc MSSA 6/2 wound cx cervical disc mssa 6/2 epidural tissue MSSA 6/3 blood NGTD  Labs: Lab Results  Component Value Date   ESRSEDRATE 11 12/08/2011   Lab Results  Component Value Date   CRP <0.5 12/08/2011       Assessment & Plan:  MSSA epidural abscess and bacteremia = patient has finished 6 wks of IV cefazolin to treat his infection. His inflammatory markers will be checked at this visit. If normalize, will not need any further antibiotics for chronic suppression. Plan to finish up antibiotics as planned. Will not need oral  abtx for suppression.

## 2012-04-06 ENCOUNTER — Other Ambulatory Visit: Payer: Self-pay | Admitting: Neurosurgery

## 2012-04-06 DIAGNOSIS — G061 Intraspinal abscess and granuloma: Secondary | ICD-10-CM

## 2012-04-10 ENCOUNTER — Ambulatory Visit
Admission: RE | Admit: 2012-04-10 | Discharge: 2012-04-10 | Disposition: A | Discharge status: Home / Self Care | Payer: Medicaid Other | Source: Ambulatory Visit | Attending: Neurosurgery | Admitting: Neurosurgery

## 2012-04-10 DIAGNOSIS — G061 Intraspinal abscess and granuloma: Secondary | ICD-10-CM

## 2012-08-08 IMAGING — CR DG CHEST 2V
1 series · 1 of 1 positions shown · non-contrast
Comparison: 10/25/2011

CLINICAL DATA: Fever and shortness of breath

CHEST - 2 VIEW

[view not recorded]
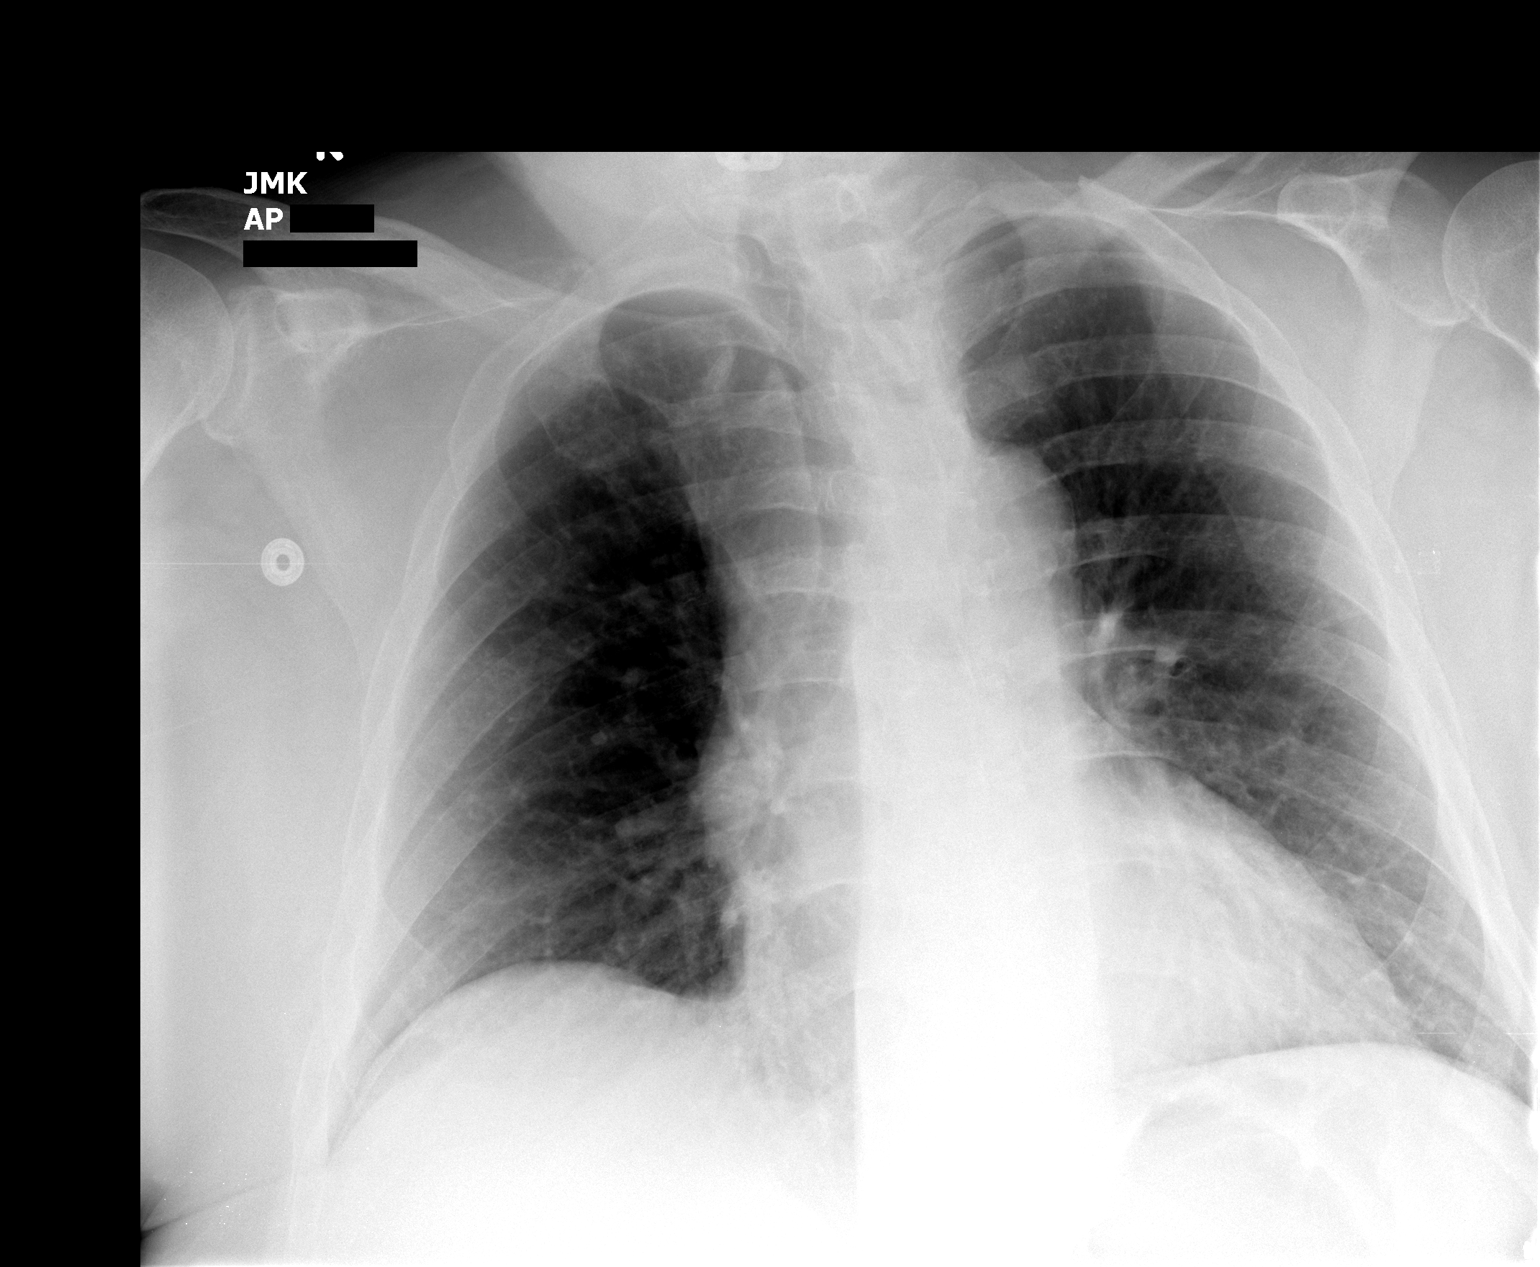

[1 of 1 positions shown; findings below may reference images not displayed]

FINDINGS: Heart size is normal.

No pleural effusion or edema.

No airspace consolidation.

Coarsened interstitial markings appear similar to previous exam.
IMPRESSION: 1.  No acute cardiopulmonary abnormalities.

## 2012-10-20 ENCOUNTER — Ambulatory Visit: Payer: Self-pay | Admitting: Internal Medicine

## 2014-01-25 ENCOUNTER — Emergency Department (HOSPITAL_COMMUNITY): Payer: Medicaid Other

## 2014-01-25 ENCOUNTER — Encounter (HOSPITAL_COMMUNITY): Payer: Self-pay | Admitting: Emergency Medicine

## 2014-01-25 ENCOUNTER — Emergency Department (HOSPITAL_COMMUNITY)
Admission: EM | Admit: 2014-01-25 | Discharge: 2014-01-25 | Discharge status: Against Medical Advice | Payer: Medicaid Other | Attending: Emergency Medicine | Admitting: Emergency Medicine

## 2014-01-25 DIAGNOSIS — M7989 Other specified soft tissue disorders: Secondary | ICD-10-CM | POA: Insufficient documentation

## 2014-01-25 DIAGNOSIS — F411 Generalized anxiety disorder: Secondary | ICD-10-CM | POA: Diagnosis not present

## 2014-01-25 DIAGNOSIS — E1142 Type 2 diabetes mellitus with diabetic polyneuropathy: Secondary | ICD-10-CM | POA: Insufficient documentation

## 2014-01-25 DIAGNOSIS — E1149 Type 2 diabetes mellitus with other diabetic neurological complication: Secondary | ICD-10-CM | POA: Diagnosis not present

## 2014-01-25 DIAGNOSIS — Z79899 Other long term (current) drug therapy: Secondary | ICD-10-CM | POA: Diagnosis not present

## 2014-01-25 DIAGNOSIS — IMO0002 Reserved for concepts with insufficient information to code with codable children: Secondary | ICD-10-CM | POA: Diagnosis not present

## 2014-01-25 DIAGNOSIS — W108XXA Fall (on) (from) other stairs and steps, initial encounter: Secondary | ICD-10-CM | POA: Insufficient documentation

## 2014-01-25 DIAGNOSIS — I10 Essential (primary) hypertension: Secondary | ICD-10-CM | POA: Diagnosis not present

## 2014-01-25 DIAGNOSIS — M5440 Lumbago with sciatica, unspecified side: Secondary | ICD-10-CM

## 2014-01-25 DIAGNOSIS — M5430 Sciatica, unspecified side: Secondary | ICD-10-CM | POA: Insufficient documentation

## 2014-01-25 DIAGNOSIS — G8929 Other chronic pain: Secondary | ICD-10-CM | POA: Diagnosis not present

## 2014-01-25 DIAGNOSIS — Y939 Activity, unspecified: Secondary | ICD-10-CM | POA: Diagnosis not present

## 2014-01-25 DIAGNOSIS — Z794 Long term (current) use of insulin: Secondary | ICD-10-CM | POA: Insufficient documentation

## 2014-01-25 DIAGNOSIS — Y929 Unspecified place or not applicable: Secondary | ICD-10-CM | POA: Insufficient documentation

## 2014-01-25 LAB — CBC
HCT: 41.6 % (ref 39.0–52.0)
Hemoglobin: 14 g/dL (ref 13.0–17.0)
MCH: 28.7 pg (ref 26.0–34.0)
MCHC: 33.7 g/dL (ref 30.0–36.0)
MCV: 85.2 fL (ref 78.0–100.0)
PLATELETS: 155 10*3/uL (ref 150–400)
RBC: 4.88 MIL/uL (ref 4.22–5.81)
RDW: 14.8 % (ref 11.5–15.5)
WBC: 8.2 10*3/uL (ref 4.0–10.5)

## 2014-01-25 LAB — BASIC METABOLIC PANEL
Anion gap: 12 (ref 5–15)
BUN: 9 mg/dL (ref 6–23)
CO2: 27 mEq/L (ref 19–32)
Calcium: 9.3 mg/dL (ref 8.4–10.5)
Chloride: 99 mEq/L (ref 96–112)
Creatinine, Ser: 0.8 mg/dL (ref 0.50–1.35)
GFR calc Af Amer: >90 mL/min (ref >90)
GLUCOSE: 109 mg/dL — AB (ref 70–99)
Potassium: 4.6 mEq/L (ref 3.7–5.3)
SODIUM: 138 meq/L (ref 137–147)

## 2014-01-25 LAB — URINALYSIS, ROUTINE W REFLEX MICROSCOPIC
BILIRUBIN URINE: NEGATIVE
Glucose, UA: NEGATIVE mg/dL
HGB URINE DIPSTICK: NEGATIVE
Ketones, ur: NEGATIVE mg/dL
Leukocytes, UA: NEGATIVE
Nitrite: NEGATIVE
PROTEIN: NEGATIVE mg/dL
Specific Gravity, Urine: 1.013 (ref 1.005–1.030)
UROBILINOGEN UA: 0.2 mg/dL (ref 0.0–1.0)
pH: 8.5 — ABNORMAL HIGH (ref 5.0–8.0)

## 2014-01-25 LAB — PRO B NATRIURETIC PEPTIDE: Pro B Natriuretic peptide (BNP): 107.2 pg/mL (ref 0–125)

## 2014-01-25 MED ORDER — CYCLOBENZAPRINE HCL 10 MG PO TABS: 10.0000 mg | ORAL_TABLET | Freq: Every day | ORAL | Status: AC

## 2014-01-25 MED ORDER — CYCLOBENZAPRINE HCL 10 MG PO TABS
10.0000 mg | ORAL_TABLET | Freq: Once | ORAL | Status: AC
  Administered 2014-01-25: 10 mg via ORAL
  Filled 2014-01-25: qty 1

## 2014-01-25 MED ORDER — MORPHINE SULFATE 4 MG/ML IJ SOLN
8.0000 mg | Freq: Once | INTRAMUSCULAR | Status: AC
  Administered 2014-01-25: 8 mg via INTRAVENOUS
  Filled 2014-01-25: qty 2

## 2014-01-25 MED ORDER — MORPHINE SULFATE 4 MG/ML IJ SOLN
8.0000 mg | Freq: Once | INTRAMUSCULAR | Status: DC
  Filled 2014-01-25: qty 2

## 2014-01-25 MED ORDER — DIAZEPAM 5 MG PO TABS: 5.0000 mg | ORAL_TABLET | Freq: Every evening | ORAL | Status: AC | PRN

## 2014-01-25 NOTE — ED Notes (Signed)
Resident at the bedside

## 2014-01-25 NOTE — ED Notes (Signed)
Pt's wife out to nurses station upset about wait time on MRI. This RN contacted MRI and made pt and family aware that there are still two patients ahead of him. Resident made aware. Family and pt requesting to leave AMA at this time. Resident at bedside discussing AMA disposition.

## 2014-01-25 NOTE — Discharge Instructions (Signed)
Back Pain, Adult °Back pain is very common. The pain often gets better over time. The cause of back pain is usually not dangerous. Most people can learn to manage their back pain on their own.  °HOME CARE  °· Stay active. Start with short walks on flat ground if you can. Try to walk farther each day. °· Do not sit, drive, or stand in one place for more than 30 minutes. Do not stay in bed. °· Do not avoid exercise or work. Activity can help your back heal faster. °· Be careful when you bend or lift an object. Bend at your knees, keep the object close to you, and do not twist. °· Sleep on a firm mattress. Lie on your side, and bend your knees. If you lie on your back, put a pillow under your knees. °· Only take medicines as told by your doctor. °· Put ice on the injured area. °¨ Put ice in a plastic bag. °¨ Place a towel between your skin and the bag. °¨ Leave the ice on for 15-20 minutes, 03-04 times a day for the first 2 to 3 days. After that, you can switch between ice and heat packs. °· Ask your doctor about back exercises or massage. °· Avoid feeling anxious or stressed. Find good ways to deal with stress, such as exercise. °GET HELP RIGHT AWAY IF:  °· Your pain does not go away with rest or medicine. °· Your pain does not go away in 1 week. °· You have new problems. °· You do not feel well. °· The pain spreads into your legs. °· You cannot control when you poop (bowel movement) or pee (urinate). °· Your arms or legs feel weak or lose feeling (numbness). °· You feel sick to your stomach (nauseous) or throw up (vomit). °· You have belly (abdominal) pain. °· You feel like you may pass out (faint). °MAKE SURE YOU:  °· Understand these instructions. °· Will watch your condition. °· Will get help right away if you are not doing well or get worse. °Document Released: 10/22/2007 Document Revised: 07/28/2011 Document Reviewed: 09/06/2013 °[employer]® Patient Information ©2015 [employer]. This information is not intended  to replace advice given to you by your health care provider. Make sure you discuss any questions you have with your health care provider. ° °

## 2014-01-25 NOTE — ED Notes (Signed)
Resident made aware of the patient's pain.  

## 2014-01-25 NOTE — ED Provider Notes (Signed)
This patient's case has been reviewed with the resident. After a verbal review of the patient's history of present illness and physical examination as well as review of his medical chart a plan was made for further diagnostic evaluation. Plan included MRI of the spine. At this point in time the patient had determined that he does not want to wait for his study. I thus he will be signing out AMA and he plans to followup with his primary providers regarding his ongoing back pain and leg pain. He has been advised of risks including increasing loss of function and disability.   EKG Interpretation None       Arby Barrette, MD 01/25/14 [phone]

## 2014-01-25 NOTE — ED Notes (Signed)
Pt c/o lower back pain and bilateral leg swelling x 1 week; pt sts hx of back sx in past

## 2014-01-25 NOTE — ED Provider Notes (Signed)
CSN: [id number]     Arrival date & time 01/25/14  1309 History   First MD Initiated Contact with Patient 01/25/14 1537     Chief Complaint  Patient presents with  . Back Pain  . Leg Swelling    Patient is a 63 y.o. male presenting with back pain. The history is provided by the patient.  Back Pain Location:  Lumbar spine Quality:  Aching Radiates to:  L thigh and R thigh Pain severity:  Moderate Pain is:  Worse during the night (worse laying flat) Onset quality:  Gradual Duration: many months but worse after falling onto outstretched hands 2 days ago tripping on steps (not due to weakness but to missing step) Timing:  Intermittent Chronicity:  Chronic Relieved by: moving throughout day, but worse at night lying flat. Exacerbated by: twisting, lying flat. Ineffective treatments: oxycodone. Associated symptoms: numbness (chronic, not new, from diabetes) and perianal numbness (unsure?)   Associated symptoms: no abdominal pain, no bladder incontinence (but does say he has had intermittent retention for past month), no bowel incontinence, no chest pain, no dysuria, no fever, no paresthesias, no tingling and no weakness   Risk factors comment:  Hx of C spine fusion complicated by discitis/ epidural abcess many years ago, has had chronic back pain since  Pt also notes DOE and lower extremity edema for past one month.  No hx of cardiac disease.  Denies chest pain.  LE edema is bilateral.  No hx of DVT.  No fevers.     Past Medical History  Diagnosis Date  . Hypertension   . Insomnia secondary to chronic pain   . Generalized headaches   . 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 Anxiety disorder    Past Surgical History  Procedure Laterality Date  . Right rotator cuff repair    . Left calcaneal surgery    . Anterior cervical decomp/discectomy fusion  10/19/2011    Procedure: ANTERIOR CERVICAL DECOMPRESSION/DISCECTOMY FUSION 2 LEVELS;  Surgeon: Reinaldo Meeker, MD;   Location: [facility]UR[facility];  Service: Neurosurgery;  Laterality: N/A;  Cervical four-five, five-six Anterior Cervical Decompression Fusion  . Tee without cardioversion  10/23/2011    Procedure: TRANSESOPHAGEAL ECHOCARDIOGRAM (TEE);  Surgeon: Peter M Sw[place]and, MD;  Location: [facility] ENDOSCOPY;  Service: Cardiovascular;  Laterality: N/A;   Family History  Problem Relation Age of Onset  . Cancer Mother   . Cancer Father   . Cancer Maternal Grandmother   . Cancer Maternal Grandfather   . Cancer Paternal Grandmother   . Cancer Paternal Grandfather   . Diabetes Brother   . Diabetes Brother   . Hypertension Mother   . Hypertension Father   . Hypertension Brother   . Hypertension Brother    History  Substance Use Topics  . Smoking status: Never Smoker   . Smokeless tobacco: Never Used  . Alcohol Use: Yes     Comment: 1 beer every three weeks    Review of Systems  Constitutional: Negative for fever.  Cardiovascular: Negative for chest pain.  Gastrointestinal: Negative for abdominal pain and bowel incontinence.  Genitourinary: Negative for bladder incontinence (but does say he has had intermittent retention for past month) and dysuria.  Musculoskeletal: Positive for back pain.  Neurological: Positive for numbness (chronic, not new, from diabetes). Negative for tingling, weakness and paresthesias.     Allergies  Review of patient's allergies indicates no known allergies.  Home Medications   Prior to  Admission medications   Medication Sig Start Date End Date Taking? Authorizing Provider  gabapentin (NEURONTIN) 300 MG capsule Take 1 capsule (300 mg total) by mouth 3 (three) times daily. 10/31/11 10/30/12  Belkys A Regalado, MD  insulin aspart (NOVOLOG) 100 UNIT/ML injection Inject 4 Units into the skin 3 (three) times daily with meals. 10/31/11 10/30/12  Belkys A Regalado, MD  insulin glargine (LANTUS) 100 UNIT/ML injection Inject 5 Units into the skin at bedtime. 10/31/11 10/30/12  Belkys A Regalado,  MD  Tamsulosin HCl (FLOMAX) 0.4 MG CAPS Take 1 capsule (0.4 mg total) by mouth daily. 10/31/11   Belkys A Regalado, MD   BP 145/84  Pulse 106  Temp(Src) 99.2 F (37.3 C) (Oral)  Resp 20  SpO2 96% Physical Exam  Nursing note and vitals reviewed. Constitutional: He is oriented to person, place, and time. He appears well-developed and well-nourished. No distress.  HENT:  Head: Normocephalic and atraumatic.  Nose: Nose normal.  Eyes: Conjunctivae are normal.  Neck: Normal range of motion. Neck supple. No tracheal deviation present.  Cardiovascular: Normal rate, regular rhythm and normal heart sounds.   No murmur heard. Pulmonary/Chest: Effort normal and breath sounds normal. No respiratory distress. He has no rales.  Abdominal: Soft. Bowel sounds are normal. He exhibits no distension and no mass. There is no tenderness.  Genitourinary:  Intact perineal sensation. Decreased but present rectal tone  Musculoskeletal: Normal range of motion. He exhibits edema (1+ pitting to bil ankles, equal.  No TTP, no erythema or warmth. Overlying chronic dermatitis changes). He exhibits no tenderness.  Neurological: He is alert and oriented to person, place, and time. He displays normal reflexes. He exhibits normal muscle tone.  Decreased sensation to bilateral feet (present but decreased to pinprick, chronic). Intact perineal sensation. 2+ equal DTRs. Toes flexor  Skin: Skin is warm and dry. No rash noted.  Psychiatric: He has a normal mood and affect.    ED Course  Procedures (including critical care time) Labs Review Labs Reviewed  BASIC METABOLIC PANEL - Abnormal; Notable for the following:    Glucose, Bld 109 (*)    All other components within normal limits  URINALYSIS, ROUTINE W REFLEX MICROSCOPIC - Abnormal; Notable for the following:    APPearance CLOUDY (*)    pH 8.5 (*)    All other components within normal limits  CBC  PRO B NATRIURETIC PEPTIDE    Imaging Review Dg Chest 2  View  01/25/2014   CLINICAL DATA:  Leg swelling.  Low back pain after fall.  EXAM: CHEST  2 VIEW  COMPARISON:  10/29/2011  FINDINGS: Cardiac silhouette is upper limits of normal in size. There is mild pulmonary vascular congestion. Diffuse prominence of the interstitial markings is similar to the prior study. No confluent airspace opacity, pleural effusion, or pneumothorax is identified. No acute osseous abnormality is seen.  IMPRESSION: Mild pulmonary vascular congestion.   Electronically Signed   By: Sebastian Ache   On: 01/25/2014 16:40     EKG Interpretation None      MDM   Final diagnoses:  Midline low back pain with sciatica, sciatica laterality unspecified  Swelling of lower extremity    PMHS for DM, chronic back pain.  Worse after traumatic injury.  Pt with decreased sensation from DM neuropathy but no acute changes.  Rectal exam with decreased tone and hx of intermittent retention, recommended MRI to r/o cord compression syndrome.  Pt was initially agreeable.  Afebrile, no IVDU, doubt epidural abscess  althrough pt does have possible hx of such.   Noted 1 month of lower extremity edema.  No evidence of anemia, kidney disease, proteinuria. Equal edema without other signs of DVT.  No obvious pulm edema/ cardiomegaly.  Will likely need non emergent echo to better evaluate for new onset CHF given edema and DOE.   Pt decided to leave AMA as MRI was taking too long.  Discussed pt risks of leaving as well as alternatives to treatment/ diagnosis.  Pt expressed understanding and still requested to leave AMA.  His symptoms were treated adequately.  He will return to ED for worsening pain, weakness, bowel/bladder dysfunction, fevers.  He will see PCP by end of week to further discuss symptoms.  Provided him with discharge analgesia.      Sofie Rower, MD 01/25/14 [phone]

## 2014-05-19 DEATH — deceased

## 2014-11-05 IMAGING — CR DG CHEST 2V
3 series · 3 of 3 positions shown · non-contrast
Comparison: 10/29/2011

CLINICAL DATA: Leg swelling.  Low back pain after fall.

EXAM:
CHEST  2 VIEW

[w chest lat (1 of 2)]
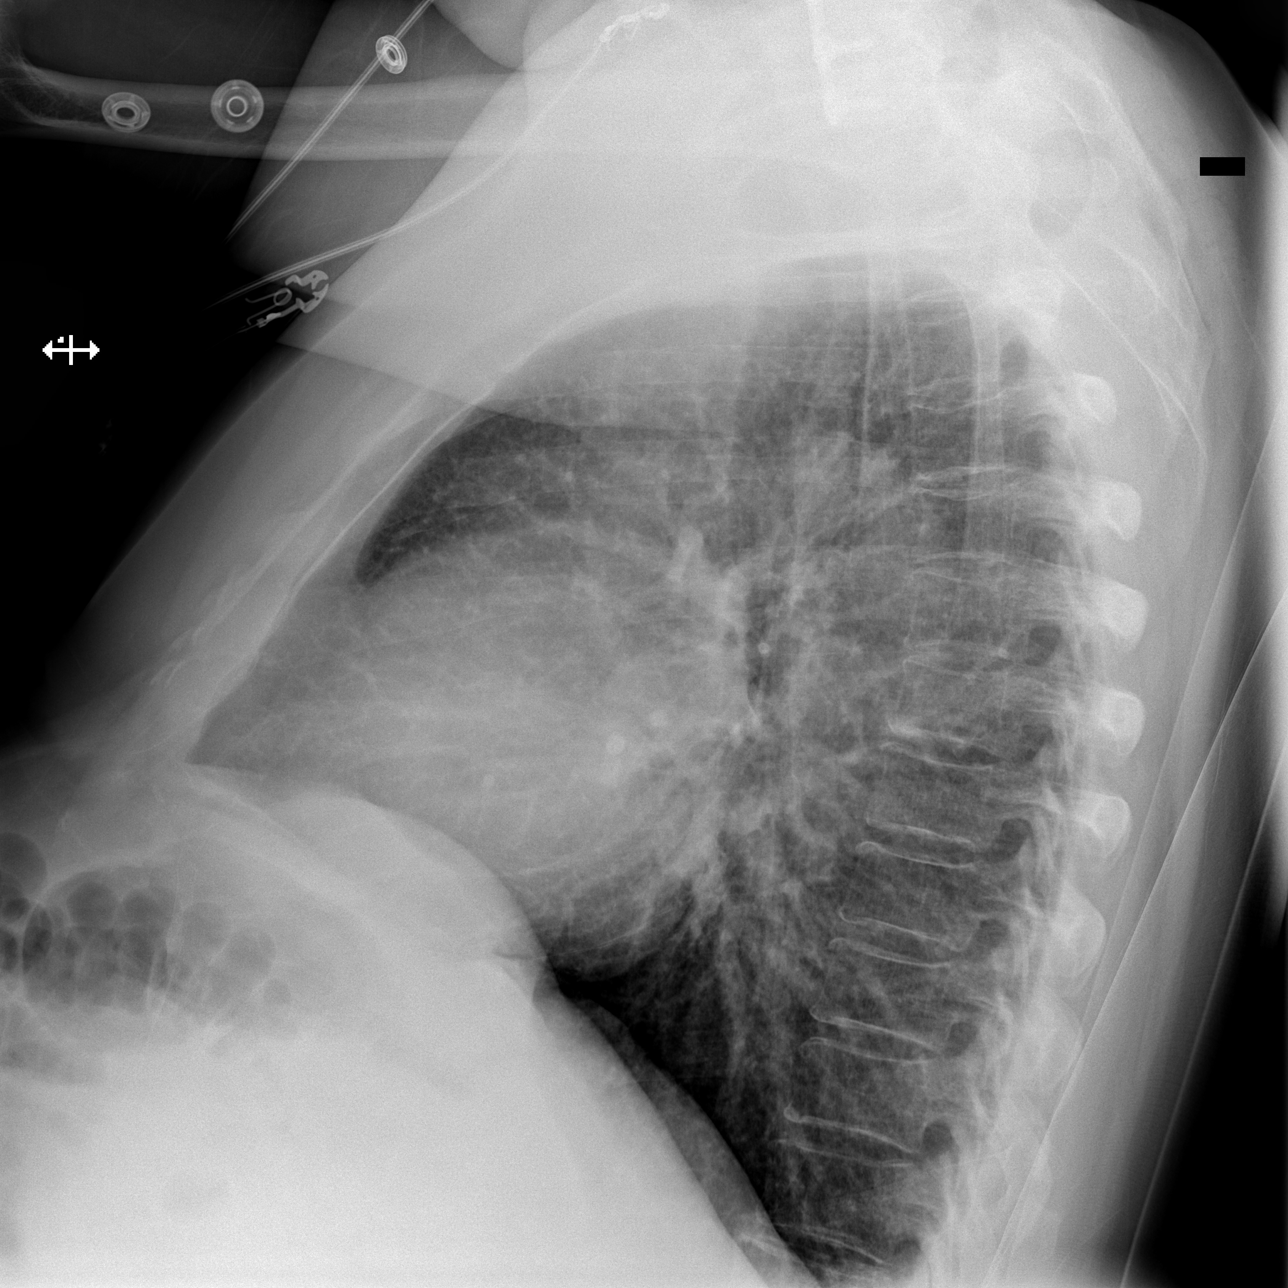

[w chest lat (2 of 2)]
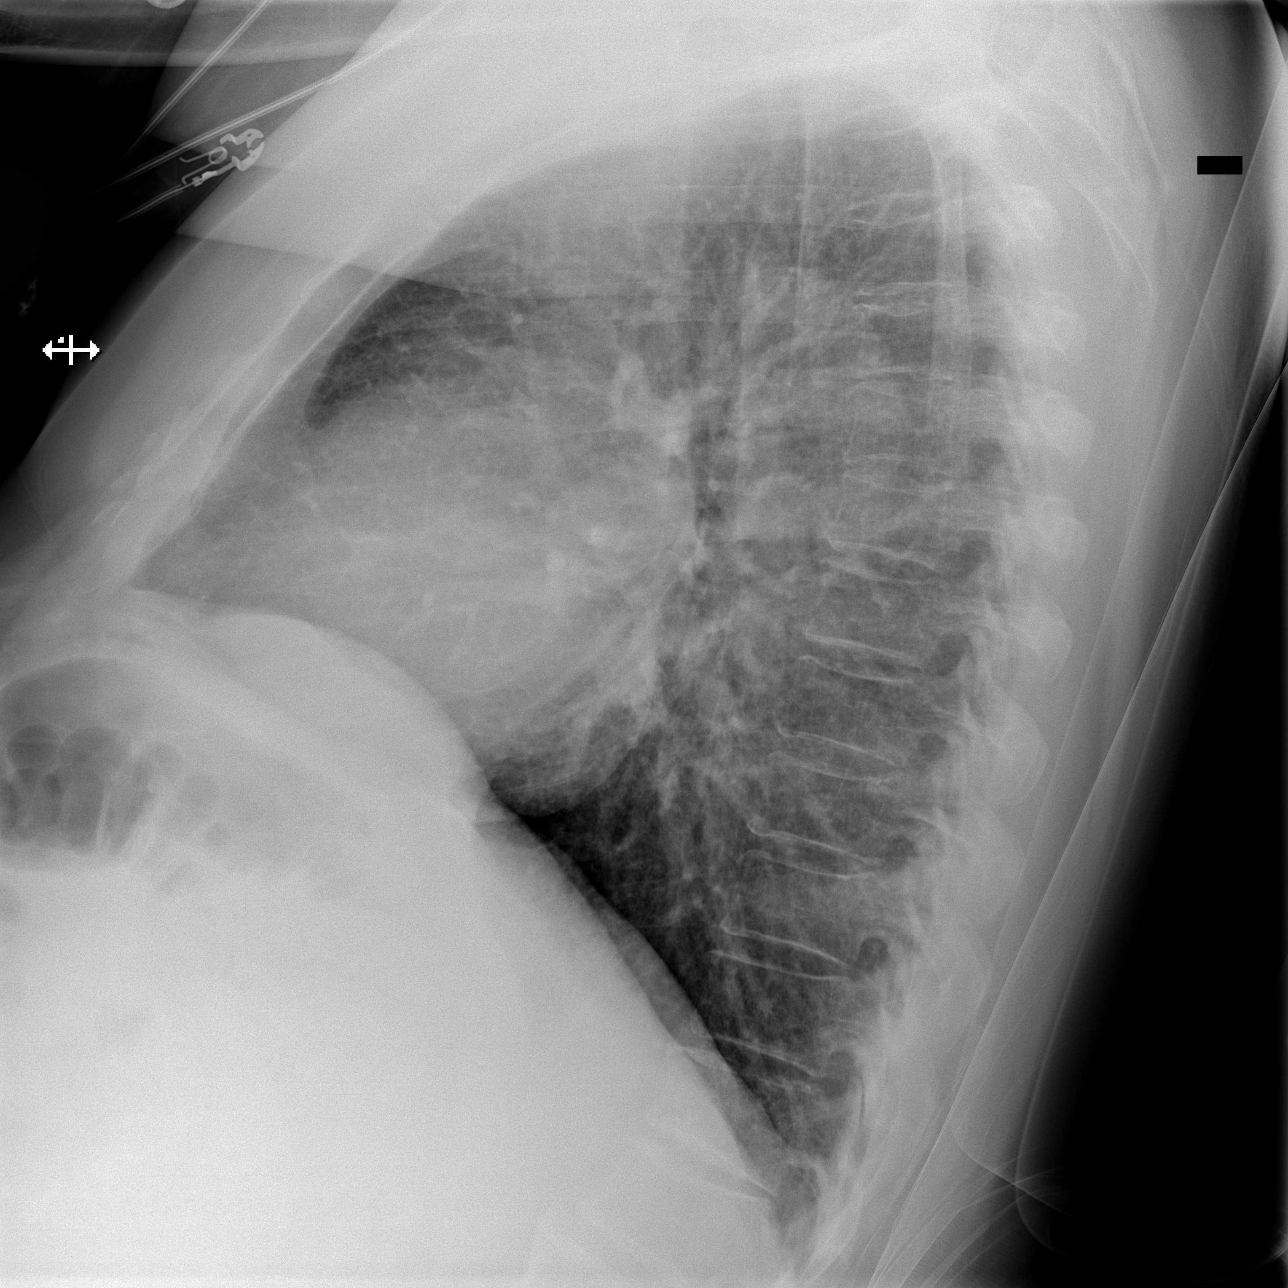

[x chest ap]
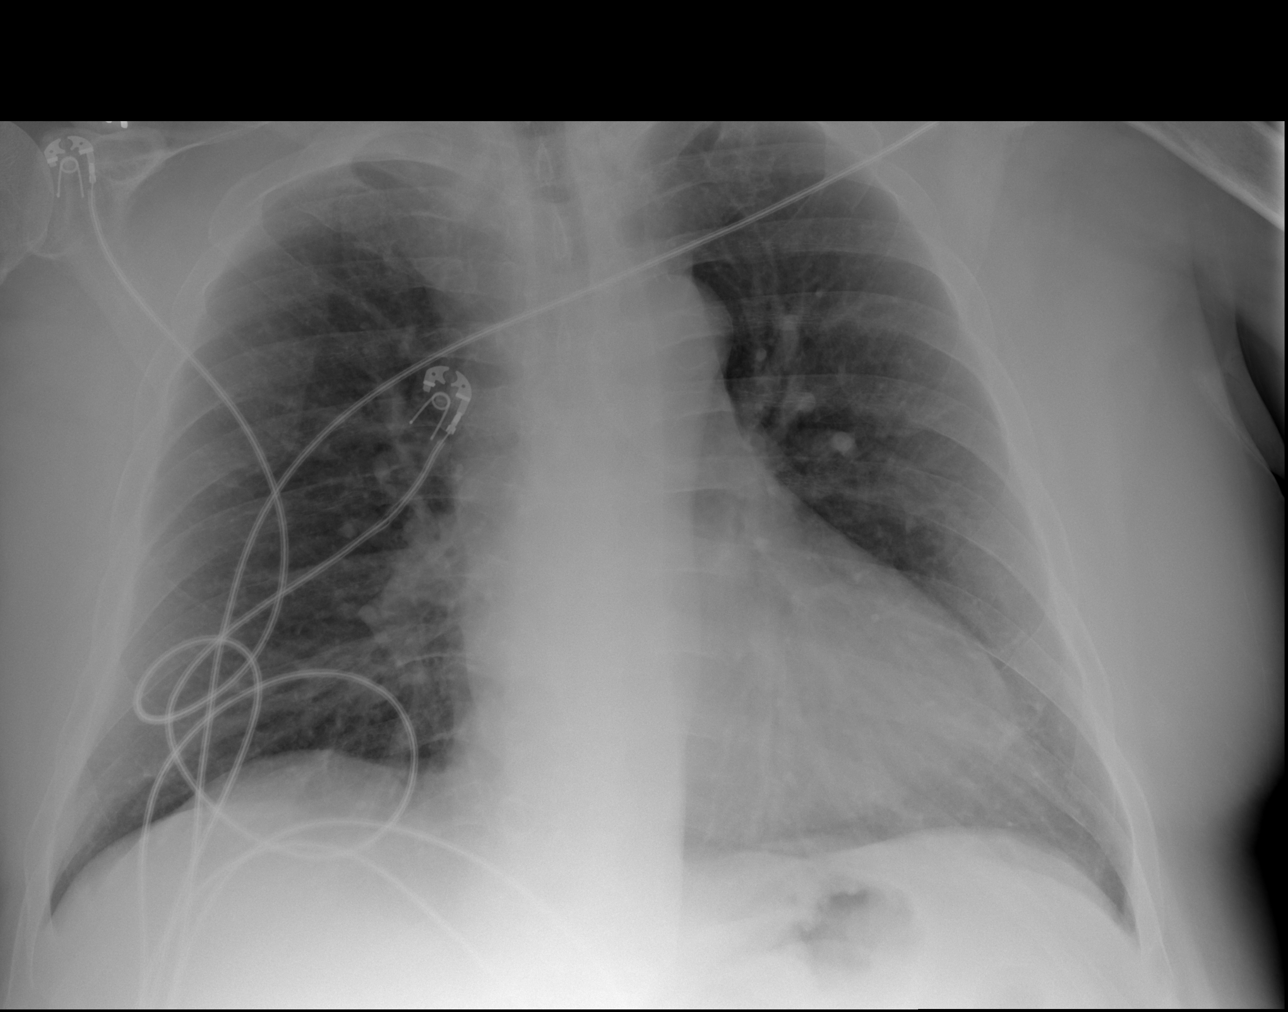

[3 of 3 positions shown; findings below may reference images not displayed]

FINDINGS: Cardiac silhouette is upper limits of normal in size. There is mild
pulmonary vascular congestion. Diffuse prominence of the
interstitial markings is similar to the prior study. No confluent
airspace opacity, pleural effusion, or pneumothorax is identified.
No acute osseous abnormality is seen.
IMPRESSION: Mild pulmonary vascular congestion.
